# Patient Record
Sex: Male | Born: 1950
Health system: Southern US, Community
[De-identification: ages and names within clinical notes are randomized; demographics above are authoritative.]

## PROBLEM LIST (undated history)

## (undated) DIAGNOSIS — I480 Paroxysmal atrial fibrillation: Secondary | ICD-10-CM

## (undated) DIAGNOSIS — G609 Hereditary and idiopathic neuropathy, unspecified: Secondary | ICD-10-CM

## (undated) DIAGNOSIS — E669 Obesity, unspecified: Secondary | ICD-10-CM

## (undated) DIAGNOSIS — Z9889 Other specified postprocedural states: Secondary | ICD-10-CM

## (undated) DIAGNOSIS — Z9989 Dependence on other enabling machines and devices: Secondary | ICD-10-CM

## (undated) DIAGNOSIS — Z973 Presence of spectacles and contact lenses: Secondary | ICD-10-CM

## (undated) DIAGNOSIS — G4733 Obstructive sleep apnea (adult) (pediatric): Secondary | ICD-10-CM

## (undated) DIAGNOSIS — Z658 Other specified problems related to psychosocial circumstances: Secondary | ICD-10-CM

## (undated) DIAGNOSIS — K219 Gastro-esophageal reflux disease without esophagitis: Secondary | ICD-10-CM

## (undated) DIAGNOSIS — Z8679 Personal history of other diseases of the circulatory system: Secondary | ICD-10-CM

## (undated) DIAGNOSIS — Z8719 Personal history of other diseases of the digestive system: Secondary | ICD-10-CM

## (undated) DIAGNOSIS — Z8614 Personal history of Methicillin resistant Staphylococcus aureus infection: Secondary | ICD-10-CM

## (undated) DIAGNOSIS — Z7901 Long term (current) use of anticoagulants: Secondary | ICD-10-CM

## (undated) DIAGNOSIS — I10 Essential (primary) hypertension: Secondary | ICD-10-CM

## (undated) DIAGNOSIS — N481 Balanitis: Secondary | ICD-10-CM

## (undated) DIAGNOSIS — Z86010 Personal history of colonic polyps: Secondary | ICD-10-CM

## (undated) DIAGNOSIS — Z860101 Personal history of adenomatous and serrated colon polyps: Secondary | ICD-10-CM

## (undated) HISTORY — PX: LAPAROSCOPIC CHOLECYSTECTOMY: SUR755

## (undated) HISTORY — PX: OTHER SURGICAL HISTORY: SHX169

## (undated) HISTORY — DX: Gastro-esophageal reflux disease without esophagitis: K21.9

## (undated) HISTORY — PX: CARDIAC ELECTROPHYSIOLOGY MAPPING AND ABLATION: SHX1292

## (undated) HISTORY — PX: COLONOSCOPY: SHX174

## (undated) HISTORY — DX: Essential (primary) hypertension: I10

## (undated) HISTORY — DX: Other specified problems related to psychosocial circumstances: Z65.8

## (undated) HISTORY — DX: Obesity, unspecified: E66.9

---

## 1999-03-21 ENCOUNTER — Encounter: Payer: Self-pay | Admitting: Family Medicine

## 1999-03-21 ENCOUNTER — Ambulatory Visit (HOSPITAL_COMMUNITY): Admission: RE | Admit: 1999-03-21 | Discharge: 1999-03-21 | Payer: Self-pay | Admitting: Family Medicine

## 2001-09-15 ENCOUNTER — Ambulatory Visit (HOSPITAL_COMMUNITY): Admission: RE | Admit: 2001-09-15 | Discharge: 2001-09-15 | Payer: Self-pay | Admitting: Family Medicine

## 2001-09-15 ENCOUNTER — Encounter: Payer: Self-pay | Admitting: Family Medicine

## 2004-11-09 ENCOUNTER — Ambulatory Visit (HOSPITAL_COMMUNITY): Admission: RE | Admit: 2004-11-09 | Discharge: 2004-11-09 | Payer: Self-pay | Admitting: Gastroenterology

## 2004-11-09 ENCOUNTER — Encounter (INDEPENDENT_AMBULATORY_CARE_PROVIDER_SITE_OTHER): Payer: Self-pay | Admitting: *Deleted

## 2006-09-30 HISTORY — PX: CARDIOVASCULAR STRESS TEST: SHX262

## 2008-04-06 ENCOUNTER — Emergency Department (HOSPITAL_BASED_OUTPATIENT_CLINIC_OR_DEPARTMENT_OTHER): Admission: EM | Admit: 2008-04-06 | Discharge: 2008-04-06 | Payer: Self-pay | Admitting: Emergency Medicine

## 2008-05-18 ENCOUNTER — Inpatient Hospital Stay (HOSPITAL_COMMUNITY): Admission: EM | Admit: 2008-05-18 | Discharge: 2008-05-19 | Payer: Self-pay | Admitting: Emergency Medicine

## 2009-01-08 ENCOUNTER — Inpatient Hospital Stay (HOSPITAL_COMMUNITY): Admission: EM | Admit: 2009-01-08 | Discharge: 2009-01-10 | Payer: Self-pay | Admitting: Cardiology

## 2009-01-08 ENCOUNTER — Ambulatory Visit: Payer: Self-pay | Admitting: Internal Medicine

## 2009-01-08 ENCOUNTER — Encounter: Payer: Self-pay | Admitting: Emergency Medicine

## 2009-01-08 ENCOUNTER — Ambulatory Visit: Payer: Self-pay | Admitting: Diagnostic Radiology

## 2009-01-09 ENCOUNTER — Encounter: Payer: Self-pay | Admitting: Internal Medicine

## 2009-02-07 DIAGNOSIS — I480 Paroxysmal atrial fibrillation: Secondary | ICD-10-CM | POA: Insufficient documentation

## 2009-02-07 DIAGNOSIS — I4892 Unspecified atrial flutter: Secondary | ICD-10-CM

## 2009-02-07 DIAGNOSIS — I10 Essential (primary) hypertension: Secondary | ICD-10-CM | POA: Insufficient documentation

## 2009-02-08 ENCOUNTER — Ambulatory Visit: Payer: Self-pay | Admitting: Internal Medicine

## 2010-01-31 ENCOUNTER — Emergency Department (HOSPITAL_BASED_OUTPATIENT_CLINIC_OR_DEPARTMENT_OTHER): Admission: EM | Admit: 2010-01-31 | Discharge: 2010-01-31 | Payer: Self-pay | Admitting: Emergency Medicine

## 2010-01-31 ENCOUNTER — Ambulatory Visit: Payer: Self-pay | Admitting: Interventional Radiology

## 2010-02-02 ENCOUNTER — Ambulatory Visit (HOSPITAL_COMMUNITY): Admission: RE | Admit: 2010-02-02 | Discharge: 2010-02-02 | Payer: Self-pay | Admitting: Cardiology

## 2010-09-14 LAB — BASIC METABOLIC PANEL
BUN: 16 mg/dL (ref 6–23)
CO2: 26 mEq/L (ref 19–32)
Calcium: 8.8 mg/dL (ref 8.4–10.5)
Chloride: 107 mEq/L (ref 96–112)
Creatinine, Ser: 1.3 mg/dL (ref 0.4–1.5)
GFR calc Af Amer: >60 mL/min (ref >60)
GFR calc non Af Amer: 56 mL/min — ABNORMAL LOW (ref >60)
Glucose, Bld: 121 mg/dL — ABNORMAL HIGH (ref 70–99)
Potassium: 3.9 mEq/L (ref 3.5–5.1)
Sodium: 145 mEq/L (ref 135–145)

## 2010-09-14 LAB — POCT CARDIAC MARKERS: Troponin i, poc: <0.05 ng/mL (ref 0.00–0.09)

## 2010-09-14 LAB — CBC
HCT: 46.3 % (ref 39.0–52.0)
Hemoglobin: 15.7 g/dL (ref 13.0–17.0)
MCV: 97.1 fL (ref 78.0–100.0)
RBC: 4.77 MIL/uL (ref 4.22–5.81)
WBC: 6.4 10*3/uL (ref 4.0–10.5)

## 2010-09-14 LAB — DIFFERENTIAL
Eosinophils Relative: 2 % (ref 0–5)
Lymphocytes Relative: 27 % (ref 12–46)
Lymphs Abs: 1.7 10*3/uL (ref 0.7–4.0)
Monocytes Absolute: 0.5 10*3/uL (ref 0.1–1.0)
Monocytes Relative: 8 % (ref 3–12)

## 2010-10-07 LAB — CBC
HCT: 41.5 % (ref 39.0–52.0)
HCT: 46 % (ref 39.0–52.0)
Platelets: 133 10*3/uL — ABNORMAL LOW (ref 150–400)
Platelets: 146 10*3/uL — ABNORMAL LOW (ref 150–400)
RDW: 13.8 % (ref 11.5–15.5)
WBC: 5.3 10*3/uL (ref 4.0–10.5)
WBC: 6.9 10*3/uL (ref 4.0–10.5)

## 2010-10-07 LAB — DIFFERENTIAL
Basophils Absolute: 0.1 10*3/uL (ref 0.0–0.1)
Basophils Relative: 2 % — ABNORMAL HIGH (ref 0–1)
Eosinophils Relative: 2 % (ref 0–5)
Monocytes Absolute: 0.4 10*3/uL (ref 0.1–1.0)
Neutro Abs: 3.4 10*3/uL (ref 1.7–7.7)

## 2010-10-07 LAB — COMPREHENSIVE METABOLIC PANEL
AST: 28 U/L (ref 0–37)
Albumin: 3.9 g/dL (ref 3.5–5.2)
Alkaline Phosphatase: 65 U/L (ref 39–117)
BUN: 14 mg/dL (ref 6–23)
Chloride: 110 mEq/L (ref 96–112)
Potassium: 4.1 mEq/L (ref 3.5–5.1)
Total Bilirubin: 0.3 mg/dL (ref 0.3–1.2)

## 2010-10-07 LAB — BASIC METABOLIC PANEL
CO2: 28 mEq/L (ref 19–32)
Chloride: 104 mEq/L (ref 96–112)
GFR calc Af Amer: >60 mL/min (ref >60)
GFR calc non Af Amer: >60 mL/min (ref >60)
Sodium: 138 mEq/L (ref 135–145)

## 2010-10-07 LAB — CARDIAC PANEL(CRET KIN+CKTOT+MB+TROPI)
CK, MB: 1.3 ng/mL (ref 0.3–4.0)
Relative Index: 1.3 (ref 0.0–2.5)
Total CK: 100 U/L (ref 7–232)
Troponin I: 0.02 ng/mL (ref 0.00–0.06)

## 2010-10-07 LAB — POCT CARDIAC MARKERS: Troponin i, poc: <0.05 ng/mL (ref 0.00–0.09)

## 2010-11-13 NOTE — Op Note (Signed)
NAME:  Carlos Thomas, Carlos Thomas              ACCOUNT NO.:  [id number]   MEDICAL RECORD NO.:  [id number]           PATIENT TYPE:   LOCATION:                                 FACILITY:   PHYSICIAN:  Hillis Range, MD       DATE OF BIRTH:  06-19-1951   DATE OF PROCEDURE:  DATE OF DISCHARGE:                               OPERATIVE REPORT   PREPROCEDURE DIAGNOSES:  1. Paroxysmal atrial fibrillation.  2. Typical-appearing atrial flutter.   POSTPROCEDURE DIAGNOSES:  1. Counterclockwise isthmus-dependent right atrial flutter.  2. Paroxysmal atrial fibrillation.   PROCEDURES:  1. Comprehensive EP study.  2. Coronary sinus pacing and recording.  3. Mapping of SVT.  4. Ablation of SVT.  5. Arrhythmia induction with pacing.   INTRODUCTION:  Carlos Thomas is a pleasant 60 year old gentleman with a  long-standing history of paroxysmal atrial fibrillation and typical-  appearing atrial flutter.  He reports initially developing atrial  fibrillation in the 1980s.  He was found to have atrial flutter in  November 2009.  He has done well without significant arrhythmias until  early yesterday morning when he developed recurrent symptomatic heart  racing and was found to have recurrent typical-appearing atrial flutter  despite medical therapy with Toprol and flecainide.  He now presents for  EP study and radiofrequency ablation.   DESCRIPTION OF PROCEDURE:  Informed written consent was obtained, and  the patient was brought to the electrophysiology lab in the fasting  state.  He was adequately sedated with intravenous Versed and fentanyl  as outlined in the nursing report.  The patient's right groin was  prepped and draped in the usual sterile fashion by the EP lab staff.  Using a percutaneous Seldinger technique, two 6-French and one 8-French  hemostasis sheaths were placed in the right common femoral vein.  A 6-  French decapolar Polaris X catheter was introduced through the right  common femoral vein and  advanced into the coronary sinus for recording  and pacing from this location.  A 6-French quadripolar Josephson  catheter was introduced through the right common femoral vein and  advanced into the right ventricle for recording and pacing.  This  catheter was then pulled back to the His bundle location.  The patient  presented to the electrophysiology lab in atrial flutter with an atrial  flutter cycle length of 260 msec.  The patient was noted to have  intermittent 2:1 and 4:1 ventricular conduction.  His HV interval  measured 60 msec.  The surface electrogram was consistent with typical  atrial flutter.  The coronary sinus activation sequence was also  consistent with right atrial flutter.  A 7-French [employer] II XP 8-mm ablation catheter was introduced through the right  common femoral vein and advanced into the right atrium.  Entrainment  mapping was performed throughout the right atrium and from the left  atrium via the coronary sinus catheter.  When pacing from the left  atrium, the post pacing interval was much longer than the tachycardia  cycle length.  When pacing from the cavotricuspid isthmus, the post  pacing  interval approximated the tachycardia cycle length.  The  tachycardia was therefore felt to represent isthmus-dependent right  atrial flutter.  I therefore elected to perform cavotricuspid isthmus  ablation.  Electroanatomical mapping of the cavotricuspid isthmus was  performed which revealed a standard-sized isthmus.  A series of  radiofrequency applications were delivered between the tricuspid valve  annulus and the inferior vena cava along the usual cavotricuspid  isthmus.  The tachycardia slowed and then terminated during ablation.  Additional bonus radiofrequency applications were delivered.  Mapping  was then performed along the cavotricuspid isthmus, and atrial  electrograms were successfully ablated.  Following ablation,  differential atrial  pacing was performed from the low lateral right  atrium which confirmed complete bidirectional isthmus block.  The  conduction time when pacing from the proximal coronary sinus to the  ablation catheter positioned on the lateral side of the isthmus line  measured 170 msec.  When pacing from the ablation catheter in this  position, the conduction time to earliest activation recorded from the  coronary sinus catheter also measured 170 msec.  Rapid atrial pacing was  then performed which revealed an AV Wenckebach cycle length of 478 msec  with no evidence of PR greater than RR.  Rapid atrial pacing was  performed down to a cycle length of 240 msec with no tachycardias  induced.  Ventricular pacing was performed which revealed midline  decremental VA conduction with a VA Wenckebach cycle length of 520 msec.  Following ablation, the AH interval measured 112 msec with an HV  interval of 49 msec.  The RR interval measured 175 msec with a PR  interval of 213 msec and a QRS duration of 115 msec.  Atrial  extrastimulus testing was performed which revealed decremental AV  conduction with no AH jumps or echo beats.  The AV nodal ERP was 500/340  msec.  Ventricular extrastimulus testing was performed which revealed  midline decremental VA conduction with no retrograde AH jumps or echo  beats.  The retrograde AV nodal ERP was 600/460 msec.  The patient was  observed for 20 minutes without return of conduction through the  cavotricuspid isthmus.  The procedure was therefore considered  completed.  All catheters were removed, and the sheaths were aspirated  and flushed.  The sheaths were removed and hemostasis was assured.  There were no early apparent complications.   CONCLUSIONS:  1. Counterclockwise isthmus-dependent right atrial flutter upon      presentation.  2. Successful radiofrequency ablation of atrial flutter along the      usual cavotricuspid isthmus with complete bidirectional isthmus       block achieved.  3. No inducible arrhythmias following ablation.  4. No early apparent complications.      Hillis Range, MD  Electronically Signed     JA/MEDQ  D:  01/09/2009  T:  01/10/2009  Job:  [id number]   cc:   Jake Bathe, MD
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
## 2010-11-13 NOTE — Consult Note (Signed)
NAME:  Carlos, Thomas NO.:  [id number]   MEDICAL RECORD NO.:  [id number]          PATIENT TYPE:  INP   LOCATION:  3712                         FACILITY:  [facility]   PHYSICIAN:  Hillis Range, MD       DATE OF BIRTH:  May 31, 1951   DATE OF CONSULTATION:  DATE OF DISCHARGE:                                 CONSULTATION   PRIMARY CAREGIVER:  Stacie Acres. Cliffton Asters, MD   CARDIOLOGIST:  Francisca December, MD   This patient has no known drug allergies.   PRESENTING CIRCUMSTANCE:  I came in with my old heart rhythm problem  again.   HISTORY OF PRESENT ILLNESS:  Mr. 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  He has a fairly long history of  paroxysmal atrial fibrillation/atrial flutter.   His first diagnosis was in 1984.  At that time, he was having very  transient, fleeting experiences of a lurching, fluttering feeling in  his chest.  He was treated from 1984 until of October 2009 with Lanoxin  0.25 mg daily and if he had any of these experiences they were very  short lived.   The patient came to the emergency room in October 2009 with symptoms of  chest discomfort, a fluttery feeling, but it would not laid up.  He had  a spontaneous conversion to sinus rhythm in the emergency room and was  discharged.   The patient returned to the emergency room at [facility] in  November 2009.  He had 4 hours of symptomatic atrial arrhythmias.  They  were diagnosed at that time as atrial flutter.  He was seen by Dr.  Ty Hilts of [facility] and started on flecainide 100 mg twice daily  at that time.  Once again, he had a spontaneous pharmacologic conversion  to sinus rhythm.  The patient has been symptom free on flecainide from  November 2009 until 9:30 in the morning of January 08, 2009.  At that time,  he experienced a return of his atrial arrhythmias.  He waited from 9  o'clock in the morning to about 9 in the evening, but then went to the  emergency room and his symptoms did not abate.  Once again, he was  diagnosed with atrial flutter.  It looks like a typical pattern.  Rate  is about 127-135 beats per minute.  The patient described these as a  lurching, fluttery feeling in his chest.  He does not have palpitations.  He is not lightheaded or dizzy.  He does have decreased exercise  tolerance.  Does not note any increased difficulty breathing.   The patient has breakthrough of atrial arrhythmia, on the flecainide  antiarrhythmic therapy.  It is thought by his primary cardiologist, Dr.  Ty Hilts that the patient would benefit from electrophysiology study and  radiofrequency catheter ablation.  He was seen by Dr. Johney Frame today who  examined electrocardiogram, talked with the patient in great length  outlining the benefits and risks of the procedure.  The patient wishes  to proceed with electrophysiology study and radiofrequency catheter  ablation of atypical atrial flutter.   ALLERGIES:  This patient no known drug allergies.   MEDICATIONS:  1. Flecainide 100 mg p.o. b.i.d.  2. Omeprazole 20 mg daily.  3. Enteric-coated aspirin 325 mg daily.  4. Toprol-XL 25 mg daily.   SOCIAL HISTORY:  The patient is married.  He does not smoke.  He very  occasionally drinks alcohol.  He works as an Lexicographer at [employer]   FAMILY HISTORY:  Negative for atrial arrhythmias.   Past cardiac workup for this patient echocardiogram in November 2009,  ejection fraction normal, trace of mitral regurgitation.  He had a  negative Myoview study in April 2008.  Of note, the patient drinks 1  caffeinated cup of coffee a day.   PAST MEDICAL HISTORY:  1. History of atrial fib/flutter, diagnosed in 1984, paroxysmal.  2. Hiatal hernia repair in 1970s.  3. PTSD.  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   PHYSICAL EXAMINATION:  GENERAL:  The patient is alert and oriented x3,   comfortable, in no acute distress.  VITAL SIGNS:  Temperature 97.7, pulse is 63 and regular, respirations  18, blood pressure 99/64, and oxygen saturation 95% on room air.  LUNGS:  Clear to auscultation bilaterally.  HEART:  Regular rate and rhythm.  ABDOMEN:  Soft, nondistended, and nontender.  Bowel sounds are present.  EXTREMITIES:  No evidence of clubbing, cyanosis, or edema.  NEUROLOGIC:  Grossly intact.   LABORATORY STUDIES THIS ADMISSION:  Cardiac panel:  Troponins are less  than 0.05, 0.02, and 0.01.  Comprehensive metabolic panel:  Sodium 145,  potassium 4.1, chloride 110, carbonate 25, glucose 126, BUN is 14,  creatinine is 0.9, alkaline phosphatase 65, SGOT is 28, and SGPT is 31.  White cells 5.3, hemoglobin 15.3, hematocrit 46.0, and platelets of 146.   Our plan is for electrophysiology study and radiofrequency catheter  ablation of atypical atrial flutter, Dr. Johney Frame.      [name], [place]      Hillis Range, MD     GM/MEDQ  D:  01/09/2009  T:  01/10/2009  Job:  [id number]

## 2010-11-13 NOTE — Discharge Summary (Signed)
Carlos Thomas, Carlos Thomas              ACCOUNT NO.:  [id number]   MEDICAL RECORD NO.:  [id number]          PATIENT TYPE:  INP   LOCATION:  3731                         FACILITY:  [facility]   PHYSICIAN:  Jake Bathe, MD      DATE OF BIRTH:  October 11, 1950   DATE OF ADMISSION:  05/18/2008  DATE OF DISCHARGE:  05/19/2008                               DISCHARGE SUMMARY   DISCHARGE DIAGNOSES:  1. Atrial flutter, spontaneous conversion back to normal sinus rhythm.  2. Flecainide therapy  3. Chronic psychosocial stress.  4. Nissen fundoplication, 1982.  5. Cholecystectomy in 2005.  6. Normal ejection fraction by recent echo.  7. Elevated blood pressure.   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  He has had a history of lone atrial fibrillation (4  episodes over the past few weeks lasting minutes to hours in duration)  and has been treated by Dr. [name] with Lanoxin.   On the day of admission, around 8:30 a.m., he states I do not feel  right.  His last episode of palpitations and dizziness was in October  2009 that lasted about 4 hours and then spontaneously converted back to  normal sinus rhythm.  On presentation to the emergency room, he was in  atrial flutter with 2:1 block with ventricular response to 160.  With  Valsalva maneuver, he had AV nodal variable conduction.   While in the hospital, we started him on flecainide.  Before his first  dose of flecainide, he did convert to normal sinus rhythm but we  continued with the therapy anyway to hopefully suppress further  symptomatic flutter.  We switched him over to metoprolol 25 ER mg QD  with the use of flecainide. We discontinued the digoxin, heart rate was  50 bpm on ECG in am.  Dr. Anne Fu did obtain a curbside consult from the  EP who agreed this would be a great way to manage the patient.   The following day, the  patient's QTC was 388.  He remained in sinus  rhythm and we felt that it was safe for him to go home.   LABORATORY DATA:  Lab studies during the patient's hospitalization  include normal cardiac isoenzymes.  TSH 0.812.  Digoxin level 1.2.  Sodium 140, potassium 4.0, BUN 10, creatinine 0.79, and glucose 97.  Hemoglobin 15.0, hematocrit 44.5, white count 5.5, and platelets 154.   Chest x-ray showed no acute cardiopulmonary findings.   DISCHARGE MEDICATIONS:  1. Flecainide 100 mg p.o. b.i.d.  2. Aspirin 325 mg 1 tablet daily.  3. Metoprolol ER 25 mg once daily.  4. Omeprazole 20 mg once daily.   DISCHARGE INSTRUCTIONS:  The patient is to remain on a low-sodium heart-  healthy diet.  Increase activity slowly.  Follow up with Tillman Sers  nurse practitioner for Dr. Corliss Marcus on May 23, 2008 at 11:30  a.m.  He is scheduled for a treadmill test the following Monday,  May 30, 2008 at 8:30  a.m.  He is not to eat after midnight the  night before the test.  He is okay to take his medications.  The reason  for the treadmill is to see if we can recreate any arrhythmias with  exercise.  He is instructed to stop his digoxin. He is to obtain an ECG  at follow up visit.   DISCHARGE TIME:  Greater than 35 minutes.      Guy Franco, P.A.      Jake Bathe, MD  Electronically Signed    LB/MEDQ  D:  05/19/2008  T:  05/19/2008  Job:  [id number]

## 2010-11-13 NOTE — H&P (Signed)
NAME:  Carlos, Thomas NO.:  [id number]   MEDICAL RECORD NO.:  [id number]          PATIENT TYPE:  INP   LOCATION:  3731                         FACILITY:  [facility]   PHYSICIAN:  Carlos Bathe, MD      DATE OF BIRTH:  1950/10/20   DATE OF ADMISSION:  05/18/2008  DATE OF DISCHARGE:                              HISTORY & PHYSICAL   CARDIOLOGIST:  Carlos December, MD   PRIMARY CARE PHYSICIAN:  Carlos Acres. White, MD   CHIEF COMPLAINT:  Atrial flutter.   HISTORY OF PRESENT ILLNESS:  A 60 year old male patient of Carlos Thomas  who was recently evaluated for lone atrial fibrillation with negative  nuclear stress test in April 2008, recent echocardiogram done last week  showing left atrial size of 4.7 cm, normal ejection fraction, trace  mitral regurgitation with palpitation starting at 8:30 this morning.  He  felt very similar with his prior episodes of lone atrial fibrillation,  the longest of which was 4 hours in October.  When the palpitations did  not subside, he decided to call the office and EMS.  He was then brought  to [facility] Emergency Department for evaluation.  An EMS strip ECG  showed atrial flutter with 2:1 conduction.  Here in the emergency  department, this atrial flutter was confirmed.  We attempted Valsalva  maneuvers which did cause variable AV block and flutter waves were  pronounced.  He did, however, returned once again to 2:1 atrial flutter  at a rate of around 160 beats per minute.  In regards to other symptoms,  he is not having any chest pain, no shortness of breath, no syncope, no  fevers, chills, cough, vomiting, or bleeding.  He does note that he has  some insomnia, but this is not new.  No bowel or bladder changes.  He  does not recall the last time he had his thyroid evaluated.  His wife is  here at bedside.   PAST MEDICAL HISTORY:  1. Atrial fibrillation - lone AFib.  Echocardiogram as described      above.  2. Prior chest pain -  negative nuclear stress test last year.  3. Chronic psychosocial stress from his post [place] days and work as      a Company secretary.   PAST SURGICAL HISTORY:  Hiatal hernia repair in the 1970s, full  laparotomy noted.   ALLERGIES:  No known drug allergies.   MEDICATIONS:  Carlos Thomas had placed him on,  1. Digoxin 0.5 mg once a day.  A recent digoxin level was shown to be      in a therapeutic range.  2. Aspirin 325 mg a day or aspirin 81 mg a day.  3. Omeprazole 20 mg a day.   SOCIAL HISTORY:  No tobacco ever.  Few drinks of alcohol, he has had 3  this week.  No binge drinking.  No drug use.  Has one serving of coffee  a day.  He exercises 3 times weekly, vigorous running 1 mile, sit-ups,  push-ups, and works out with his students.  He has occupation  as an  Secondary school teacher for [employer].   REVIEW OF SYSTEMS:  Unless specified above all other 12 review of  systems negative.   PHYSICAL EXAMINATION:  VITAL SIGNS:  Temperature afebrile, pulse 160,  blood pressure 113/70, respirations 16, and saturating 100% on room air.  GENERAL:  Alert and oriented x3 in no acute distress, here with his  wife.  HEENT:  Normocephalic and atraumatic.  No scleral icterus.  EOMI.  NECK:  Supple.  No carotid bruits.  No JVD.  Full range of motion.  No  thyromegaly appreciated.  CARDIOVASCULAR:  Tachycardic, regular rhythm.  No appreciable murmurs.  Normal PMI, bounding.  LUNGS:  Clear to auscultation bilaterally.  Normal respiratory effort.  ABDOMEN:  Soft and nontender.  Normoactive bowel sounds.  No rebound, no  guarding, no hepatosplenomegaly.  Prior hiatal hernia surgical scar  noted.  MUSCULOSKELETAL:  Full range of motion noted.  NEUROLOGIC:  Nonfocal.  No tremors.  Normal-appearing reflexes.  PSYCH:  Normal mood.  EXTREMITIES:  No clubbing, cyanosis, or edema.   LABORATORY DATA:  Chest x-ray currently pending.  Echocardiogram from  the office showed left atrial size of 4.7 cm, normal  ejection fraction,  and normal LV size.  Complete metabolic profile currently pending.  INR  pending.  TSH pending.  EKG personally viewed shows atrial flutter 2:1  conduction, rate of approximately 160.  As noted in HPI with Valsalva,  he was able to transmit variable AV conduction.   ASSESSMENT AND PLAN:  A 60 year old male with prior history of lone  atrial fibrillation now with atrial flutter with rapid ventricular  response.  1. Atrial flutter - with normal nuclear stress test earlier last year      as well as normal left ventricular ejection fraction on      echocardiogram,  I would like to try an antiarrhythmic on him,      flecainide 50 mg twice a day with an increase to 100 mg BID on      discharge to try to put him back into sinus rhythm.  We will obtain      an EKG and continue monitoring his QT interval after use of      flecainide.  We have discussed risk of stroke with atrial      flutter/fibrillation and given his low [place] score, currently zero      (he does not have hypertension, he does not have diabetes, he has      not had a prior stroke, his age is less than 55, and he does not      have any evidence of heart failure), I do feel fine treating him      with aspirin 325 mg once a day.  I will also add low-dose beta-      blocker metoprolol 25 mg twice a day to his regimen and continue      his digoxin at 125 mcg once a day.  If flecainide is unsuccessful      at cardioverting him, we may try electrical cardioversion.  I have      also discussed with him the possibility of atrial flutter ablation      in the future if necessary.  At this point, we will check TSH,      complete metabolic profile, and CBC.  I am also awaiting chest x-      ray.  We will admit to telemetry floor.      Carlos Leriche  Wilber Oliphant, MD  Electronically Signed     MCS/MEDQ  D:  05/18/2008  T:  05/19/2008  Job:  [id number]   cc:   Carlos Thomas, M.D.

## 2010-11-13 NOTE — H&P (Signed)
NAMEEDOUARD, GIKAS NO.:  [id number]   MEDICAL RECORD NO.:  [id number]          PATIENT TYPE:  INP   LOCATION:  3735                         FACILITY:  [facility]   PHYSICIAN:  Therisa Doyne, MD    DATE OF BIRTH:  [birth year]   DATE OF ADMISSION:  01/08/2009  DATE OF DISCHARGE:                              HISTORY & PHYSICAL   CHIEF COMPLAINT:  Atrial flutter with rapid ventricular response.   HISTORY OF PRESENT ILLNESS:  A 60 year old white male with past medical  history significant for paroxysmal atrial fibrillation and atrial  flutter who presents atrial flutter with rapid response.  The patient  states he was in his usual state of health, until this morning at 9:30  a.m. on July 1; he began having palpitations and noticed that he was in  atrial fibrillation with rapid ventricular response.  He denies any  other symptoms.  He denies any chest pain, shortness of breath,  orthopnea, PND, syncope or lower extremity edema.  When asked about  precipitants, he states that he drinks one cup of coffee per day and he  had had his coffee this morning when his symptoms started.  He also  reports staying up late the previous night as well as being mildly  dehydrated.  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  Otherwise, he denies any precipitants.   The patient was seen and evaluated at an outside emergency department.  He was given 5 mg IV Lopressor and his systolic blood pressure dropped  to 90.  It was recommended to be transferred to [facility], as  that is where his primary cardiologist was.   PAST MEDICAL HISTORY:  1. Paroxysmal atrial fibrillation and flutter.  2. CHADS2 score equals one for hypertension.  He has no history of      diabetes, strokes or TIAs.  He is maintained on aspirin therapy for      his paroxysmal atrial fibrillation, atrial flutter.   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  He drinks one cup of caffeinated  coffee per day.  He denies any tobacco use.  He drinks less than one  alcoholic drink per week.   FAMILY HISTORY:  Negative for atrial fibrillation.   MEDICATIONS:  1. Aspirin 325 mg daily.  2. Toprol XL 25 mg daily.  3. Flecainide 100 mg b.i.d.  4. Omeprazole 20 mg daily.   ALLERGIES:  NO KNOWN DRUG ALLERGIES.   REVIEW OF SYSTEMS:  Notable for intermittent right facial numbness and  tingling last week which was transient.  There was no other associated  neurological signs.  Otherwise, all systems reviewed and negative except  as mentioned above in history of present illness.   PHYSICAL EXAMINATION:  VITAL SIGNS:  Temperature afebrile, blood  pressure of 95/71, pulse 127, respirations 18, oxygen saturation 94% on  room air.  GENERAL:  No acute distress.  HEENT: Normocephalic atraumatic.  Oropharynx pink and moist without  lesions.  NECK:  Supple.  No lymphadenopathy or  jugular distention.  No masses.  CARDIOVASCULAR:  Tachycardic, irregular.  No murmurs, rubs or gallops.  CHEST:  Clear to auscultation bilaterally.  ABDOMEN:  Positive bowel sounds, soft, nontender, nondistended.  EXTREMITIES:  No clubbing, sinus or edema.  Dorsalis pedis pulse 2+  bilaterally.  SKIN:  No rashes.  BACK:  No CVA tenderness.   PERTINENT DATA:  1. Normal CBC, potassium 4.1, BUN 14, creatinine 0.9.  Normal liver      function studies.  Troponin less than 0.05, INR one.  Chest x-ray:      No acute cardiopulmonary disease.  2. EKG shows atrial flutter with variable AV block with ventricular      rate of 127 beats per minute.  There is nonspecific ST-T wave      changes.  The EKG pronounce shows a calculated QTC of 497.      However, I think this is measuring the flutter waves.  On my      calculation, the QT measures 280 and QTC is 407.   ASSESSMENT/PLAN:  Mr. Xia is a 60 year old white male with past  medical history  significant for paroxysmal atrial fibrillation and  borderline hypertension who presents with atrial flutter with rapid  ventricular response.   1. Will admit the patient to the [facility] Cardiology service under Dr.      [name] care.   1. Atrial flutter with rapid ventricle response.  It is unclear what      the precipitant for this was.  However, the patient does report      that he has been under a little bit of stress recently as well as      may have been mildly dehydrated.  Certainly all these things could      be playing a role in this.  He has not had an evening dose of      flecainide, so we will actually go ahead and give him his evening      dose with as well as supplemental dose for total of 200 mg.  I will      do this in hopes of converting him to normal sinus rhythm.      Continue him on flecainide 100 mg b.i.d. starting in the morning.      Continue Toprol XL 25 mg daily.  If he does not convert with the      200 mg of flecainide, then I think he should be cardioverted in the      morning to normal sinus rhythm.  There will be no need to perform a      transesophageal echocardiogram as these symptoms started less than      48 hours ago.  He states that they started at 9:30 a.m. on July 11.      As he will likely undergoing cardioversion, we will go ahead and      systemically anticoagulate the patient with Lovenox 1 mg/kg q.12 h.      Check TSH as well as cycle cardiac enzymes.  Continue aspirin      therapy at 325 mg daily.  The patient may be mildly volume depleted      from dehydration.  We will start him on normal saline at 75 mL per      hour.   1. Gastric she reflux disease.  Continue omeprazole.   1. Fluids, Electrolytes, Nutrition.  Electrolytes are stable.  Check a      magnesium level.  Normal saline  at 75 cc/hour n.p.o.   1. Deep vein thrombosis prophylaxis.  Not indicated as the patient      will be on systemic anticoagulation with Lovenox.     Therisa Doyne, MD  Electronically Signed    SJT/MEDQ  D:  01/09/2009  T:  01/09/2009  Job:  [id number]

## 2010-11-16 NOTE — Op Note (Signed)
NAME:  Carlos Thomas, Carlos Thomas              ACCOUNT NO.:  [id number]   MEDICAL RECORD NO.:  [id number]          PATIENT TYPE:  AMB   LOCATION:  ENDO                         FACILITY:  [facility]   PHYSICIAN:  Petra Kuba, M.D.    DATE OF BIRTH:  24-Feb-1951   DATE OF PROCEDURE:  11/09/2004  DATE OF DISCHARGE:                                 OPERATIVE REPORT   PROCEDURE:  Colonoscopy.   INDICATIONS FOR PROCEDURE:  Screening.  Consent was signed after risks,  benefits, methods, and options were thoroughly discussed in the office by my  nurse.   MEDICATIONS USED:  Demerol 100, Versed 10.   PROCEDURE:  Rectal inspection was pertinent for small external hemorrhoids.  Digital exam was negative.  The video colonoscope was inserted and with  abdominal pressure, easily advanced around the colon to the cecum.  Other  than an occasional left sided diverticula, no abnormalities were seen on  insertion.  The cecum was identified by the appendiceal orifice and the  ileocecal valve.  The scope was slowly withdrawn.  The prep was adequate,  there was some liquid stool that required washing and suctioning.  In the  ascending colon proximal transverse, two small polyps were seen, snared,  electrocautery applied, polyps were suctioned through the scope and  collected in the trap.  There were an additional five other tiny polyps in  this area, descending and proximal transverse, which were hot biopsied and  all put in the same container.  The scope was slowly withdrawn.  In the more  distal transverse, a small polyp was seen and electrocautery applied.  The  polyp was suctioned through the scope and collected in the trap.  There was  also another small distal transverse polyp biopsied as well as two proximal  descending tiny polyps which were hot biopsied and they were all put in the  second container.  The scope was further withdrawn.  Other than the  occasional left sided diverticula, no other abnormalities  were seen as we  slowly withdrew back to the rectum.  Once back in the rectum, anorectal pull  through and retroflexion confirmed some small hemorrhoids.  The scope was  straightened and readvanced a short ways to the left side of the colon, air  was suctioned, the scope was removed.  The patient tolerated the procedure  well.  There was no obvious complications.   ENDOSCOPIC DIAGNOSIS:  1.  Internal and external hemorrhoids.  2.  Occasional left sided diverticulum.  3.  Two tiny descending polyps, hot biopsied.  4.  One tiny and one small distal transverse polyp, hot biopsied and snared.  5.  Multiple ascending and proximal transverse polyps, two snared, five hot      biopsied.  6.  Otherwise, within normal limits to the cecum.   PLAN:  Await pathology to determine future colonic screening, probably  recheck in three years.  Hold aspirin products for seven days based on  number of polyps and cautery used.  Happy to see back p.r.n.  Otherwise  return to Dr. Tiburcio Pea for the customary health care maintenance.  MEM/MEDQ  D:  11/09/2004  T:  11/09/2004  Job:  [id number]   cc:   Holley Bouche, M.D.  [address]  [place], [place] [address]  Fax: [phone]

## 2010-11-22 ENCOUNTER — Other Ambulatory Visit: Payer: Self-pay | Admitting: Chiropractor

## 2010-11-22 DIAGNOSIS — Z139 Encounter for screening, unspecified: Secondary | ICD-10-CM

## 2010-11-22 DIAGNOSIS — M79601 Pain in right arm: Secondary | ICD-10-CM

## 2010-11-23 ENCOUNTER — Inpatient Hospital Stay: Admission: RE | Admit: 2010-11-23 | Payer: Self-pay | Source: Ambulatory Visit

## 2010-11-23 ENCOUNTER — Other Ambulatory Visit: Payer: Self-pay

## 2011-01-01 ENCOUNTER — Encounter: Payer: Self-pay | Admitting: Internal Medicine

## 2011-03-15 ENCOUNTER — Encounter (HOSPITAL_BASED_OUTPATIENT_CLINIC_OR_DEPARTMENT_OTHER): Payer: Self-pay | Admitting: Emergency Medicine

## 2011-03-15 ENCOUNTER — Other Ambulatory Visit: Payer: Self-pay

## 2011-03-15 ENCOUNTER — Emergency Department (HOSPITAL_BASED_OUTPATIENT_CLINIC_OR_DEPARTMENT_OTHER)
Admission: EM | Admit: 2011-03-15 | Discharge: 2011-03-15 | Disposition: A | Discharge status: Home / Self Care | Payer: BC Managed Care – PPO | Attending: Emergency Medicine | Admitting: Emergency Medicine

## 2011-03-15 DIAGNOSIS — R002 Palpitations: Secondary | ICD-10-CM | POA: Insufficient documentation

## 2011-03-15 DIAGNOSIS — I4891 Unspecified atrial fibrillation: Secondary | ICD-10-CM | POA: Insufficient documentation

## 2011-03-15 DIAGNOSIS — I4892 Unspecified atrial flutter: Secondary | ICD-10-CM | POA: Insufficient documentation

## 2011-03-15 DIAGNOSIS — I10 Essential (primary) hypertension: Secondary | ICD-10-CM | POA: Insufficient documentation

## 2011-03-15 LAB — CBC
Hemoglobin: 16.1 g/dL (ref 13.0–17.0)
MCHC: 35.2 g/dL (ref 30.0–36.0)
RDW: 13.5 % (ref 11.5–15.5)
WBC: 4.8 10*3/uL (ref 4.0–10.5)

## 2011-03-15 LAB — DIFFERENTIAL
Basophils Absolute: 0 10*3/uL (ref 0.0–0.1)
Basophils Relative: 0 % (ref 0–1)
Lymphocytes Relative: 28 % (ref 12–46)
Monocytes Relative: 13 % — ABNORMAL HIGH (ref 3–12)
Neutro Abs: 2.6 10*3/uL (ref 1.7–7.7)
Neutrophils Relative %: 54 % (ref 43–77)

## 2011-03-15 LAB — BASIC METABOLIC PANEL
CO2: 25 mEq/L (ref 19–32)
Chloride: 105 mEq/L (ref 96–112)
GFR calc Af Amer: >60 mL/min (ref >60)
Potassium: 3.9 mEq/L (ref 3.5–5.1)

## 2011-03-15 LAB — TROPONIN I: Troponin I: <0.3 ng/mL (ref <0.30)

## 2011-03-15 MED ORDER — SODIUM CHLORIDE 0.9 % IV SOLN: Freq: Once | INTRAVENOUS | Status: DC

## 2011-03-15 MED ORDER — METOPROLOL TARTRATE 1 MG/ML IV SOLN
INTRAVENOUS | Status: AC
  Administered 2011-03-15: 5 mg
  Filled 2011-03-15: qty 15

## 2011-03-15 MED ORDER — METOPROLOL TARTRATE 1 MG/ML IV SOLN
5.0000 mg | INTRAVENOUS | Status: DC | PRN
  Administered 2011-03-15: 5 mg via INTRAVENOUS

## 2011-03-15 NOTE — ED Notes (Signed)
Cardiac rhythm remains irregular (afib vs. Aflutter), but rate is now 80s-90s

## 2011-03-15 NOTE — ED Provider Notes (Signed)
Assumed care from Dr. Read Drivers at 7:00.  I will contact the low cardiology after 8:00 for request that he be evaluated in the office for possible cardioversion since his symptoms started last night.  He may be a candidate for cardioversion.  8:52 AM I spoke with Dr. Anne Fu.  He asked me to call his office to arrange for patient to be seen today for possible cardioversion.  I explained this to pt. He understands and agrees. He has heart rate in 70-80s.Marland Kitchen  8:56 AM I spoke with office staff. They will work him in . They want him to come now with copies of ecgs.   Nicholes Stairs, MD 03/15/11 [phone]

## 2011-03-15 NOTE — ED Notes (Signed)
Pt and spouse verbalized understanding to go directly to [facility] Cardiology. Copies of both EKG's given to pt.

## 2011-03-15 NOTE — ED Notes (Signed)
Pt reports chest "fluttering" since last pm, intermittent; hx of afib & aflutter; denies other sx presently.

## 2011-03-15 NOTE — ED Provider Notes (Addendum)
History     CSN: [id number] Arrival date & time: 03/15/2011  5:47 AM   Chief Complaint  Patient presents with  . Palpitations     (Include location/radiation/quality/duration/timing/severity/associated sxs/prior treatment) HPI This is a 60 year old white male with history of paroxysmal atrial flutter and atrial fibrillation. He has had an ablation for this in the past. He felt his heart go into atrial flutter yesterday evening about 8 PM. He describes it as feeling like a flutter. The rate is faster than normal. It is not exacerbated or mitigated by anything, though he did drink a glass of wine before it occurred. There is no accompanying chest pain, shortness of breath, nausea, vomiting, lightheadedness, weakness or diaphoresis. He took 240 mg of Cardizem about 45 minutes prior to arrival.  Past Medical History  Diagnosis Date  . HTN (hypertension)   . Atrial flutter   . Atrial fibrillation      Past Surgical History  Procedure Date  . Cti ablation 7/10    for atrial flutter  . Hernia repair     Family History  Problem Relation Age of Onset  . Atrial fibrillation Neg Hx     History  Substance Use Topics  . Smoking status: Unknown If Ever Smoked  . Smokeless tobacco: Not on file   Comment: denies any tobacco use   . Alcohol Use: Yes     lass than 1 alcoholic beverage/week       Review of Systems  All other systems reviewed and are negative.    Allergies  Review of patient's allergies indicates no known allergies.  Home Medications   Current Outpatient Rx  Name Route Sig Dispense Refill  . DILTIAZEM HCL COATED BEADS 240 MG PO CP24 Oral Take 240 mg by mouth daily.      . ASPIRIN 325 MG PO TBEC Oral Take 325 mg by mouth daily.      Marland Kitchen FLECAINIDE ACETATE 100 MG PO TABS Oral Take 100 mg by mouth 2 (two) times daily.      Marland Kitchen METOPROLOL SUCCINATE 25 MG PO TB24 Oral Take 25 mg by mouth daily.      Marland Kitchen OMEPRAZOLE 40 MG PO CPDR Oral Take 40 mg by mouth daily.         Physical Exam    BP 131/89  Pulse 115  Resp 18  Ht 5\' 11"  (1.803 m)  Wt 230 lb (104.327 kg)  BMI 32.08 kg/m2  SpO2 97%  Physical Exam General: Well-developed, well-nourished male in no acute distress; appearance consistent with age of record HENT: normocephalic, atraumatic Eyes: Normal Neck: supple Heart: Tachycardic; regular rhythm; atrial flutter seen on monitor Lungs: clear to auscultation bilaterally Abdomen: soft; nontender; nondistended; no masses or hepatosplenomegaly; bowel sounds present Extremities: No deformity; full range of motion; pulses normal Neurologic: Awake, alert and oriented;motor function intact in all extremities and symmetric; no facial droop Skin: Warm and dry Psychiatric: Normal mood and affect   ED Course  Procedures    MDM  EKG Interpretation:  Date & Time: 03/15/2011 5:39 AM  Rate: 126  Rhythm: atrial flutter  QRS Axis: indeterminate  Intervals: QT prolonged  ST/T Wave abnormalities: nonspecific T wave changes  Conduction Disutrbances:none  Narrative Interpretation:   Old EKG Reviewed: changes noted: Previously atrial bigeminy (02/02/2010)  Nursing notes and vitals signs, including pulse oximetry, reviewed.  Summary of this visit's results, reviewed by myself:  Labs:  Results for orders placed during the hospital encounter of 03/15/11  CBC  Component Value Range   WBC 4.8  4.0 - 10.5 (K/uL)   RBC 4.91  4.22 - 5.81 (MIL/uL)   Hemoglobin 16.1  13.0 - 17.0 (g/dL)   HCT 16.1  09.6 - 04.5 (%)   MCV 93.1  78.0 - 100.0 (fL)   MCH 32.8  26.0 - 34.0 (pg)   MCHC 35.2  30.0 - 36.0 (g/dL)   RDW 40.9  81.1 - 91.4 (%)   Platelets 151  150 - 400 (K/uL)  DIFFERENTIAL      Component Value Range   Neutrophils Relative 54  43 - 77 (%)   Neutro Abs 2.6  1.7 - 7.7 (K/uL)   Lymphocytes Relative 28  12 - 46 (%)   Lymphs Abs 1.3  0.7 - 4.0 (K/uL)   Monocytes Relative 13 (*) 3 - 12 (%)   Monocytes Absolute 0.6  0.1 - 1.0 (K/uL)    Eosinophils Relative 4  0 - 5 (%)   Eosinophils Absolute 0.2  0.0 - 0.7 (K/uL)   Basophils Relative 0  0 - 1 (%)   Basophils Absolute 0.0  0.0 - 0.1 (K/uL)  BASIC METABOLIC PANEL      Component Value Range   Sodium 141  135 - 145 (mEq/L)   Potassium 3.9  3.5 - 5.1 (mEq/L)   Chloride 105  96 - 112 (mEq/L)   CO2 25  19 - 32 (mEq/L)   Glucose, Bld 134 (*) 70 - 99 (mg/dL)   BUN 9  6 - 23 (mg/dL)   Creatinine, Ser 7.82  0.50 - 1.35 (mg/dL)   Calcium 9.0  8.4 - 95.6 (mg/dL)   GFR calc non Af Amer >60  >60 (mL/min)   GFR calc Af Amer >60  >60 (mL/min)  TROPONIN I      Component Value Range   Troponin I <0.30  <0.30 (ng/mL)    Imaging Studies: No results found.  6:32 AM Patient now in atrial fibrillation with controlled rate after receiving a total of 10 mg of metoprolol IV.  7:04 AM The plan is to have the patient monitored in the ED and to legal office opens. At that time we'll contact To arrange for the patient to be seen promptly in the office. This has been explained Dr. Weldon Inches.         Hanley Seamen, MD 03/15/11 2130  Hanley Seamen, MD 03/27/11 [phone]

## 2011-04-02 LAB — BASIC METABOLIC PANEL
BUN: 11
CO2: 27
Calcium: 9.5
Creatinine, Ser: 1.1
Glucose, Bld: 106 — ABNORMAL HIGH
Sodium: 144

## 2011-04-02 LAB — POCT CARDIAC MARKERS: CKMB, poc: 2.1

## 2011-04-03 LAB — CBC
HCT: 44.5
Hemoglobin: 15
MCHC: 33.7
RBC: 4.56

## 2011-04-03 LAB — COMPREHENSIVE METABOLIC PANEL
BUN: 10
CO2: 24
Calcium: 8.6
Creatinine, Ser: 0.79
GFR calc non Af Amer: >60
Glucose, Bld: 97
Sodium: 140
Total Protein: 5.7 — ABNORMAL LOW

## 2011-04-03 LAB — DIFFERENTIAL
Eosinophils Absolute: 0
Lymphocytes Relative: 23
Lymphs Abs: 1.3
Monocytes Relative: 7
Neutro Abs: 3.8
Neutrophils Relative %: 68

## 2011-04-03 LAB — APTT: aPTT: 29

## 2011-04-03 LAB — DIGOXIN LEVEL: Digoxin Level: 1.2

## 2011-04-03 LAB — TSH: TSH: 0.812

## 2011-04-03 LAB — CK TOTAL AND CKMB (NOT AT ARMC)
CK, MB: 1.5
CK, MB: 1.8
Total CK: 115

## 2011-04-03 LAB — PROTIME-INR
INR: 1
Prothrombin Time: 13.6

## 2011-04-03 LAB — TROPONIN I: Troponin I: 0.01

## 2011-07-24 ENCOUNTER — Encounter: Payer: Self-pay | Admitting: Internal Medicine

## 2011-07-29 ENCOUNTER — Encounter: Payer: Self-pay | Admitting: Internal Medicine

## 2011-07-29 ENCOUNTER — Telehealth: Payer: Self-pay | Admitting: *Deleted

## 2011-07-29 ENCOUNTER — Ambulatory Visit (INDEPENDENT_AMBULATORY_CARE_PROVIDER_SITE_OTHER): Payer: BC Managed Care – PPO | Admitting: Internal Medicine

## 2011-07-29 ENCOUNTER — Encounter: Payer: Self-pay | Admitting: *Deleted

## 2011-07-29 VITALS — BP 131/75 | HR 58 | Ht 71.0 in | Wt 232.0 lb

## 2011-07-29 DIAGNOSIS — I4891 Unspecified atrial fibrillation: Secondary | ICD-10-CM

## 2011-07-29 DIAGNOSIS — I10 Essential (primary) hypertension: Secondary | ICD-10-CM

## 2011-07-29 LAB — BASIC METABOLIC PANEL
BUN: 13 mg/dL (ref 6–23)
CO2: 25 mEq/L (ref 19–32)
Chloride: 107 mEq/L (ref 96–112)
Potassium: 4.1 mEq/L (ref 3.5–5.1)

## 2011-07-29 MED ORDER — RIVAROXABAN 20 MG PO TABS: 20.0000 mg | ORAL_TABLET | Freq: Every day | ORAL | Status: DC

## 2011-07-29 NOTE — Patient Instructions (Signed)
Your physician has recommended you make the following change in your medication: Stop Aspirin, Start Xarelto 20mg  1 tablet daily.  See instruction sheet for ablation.

## 2011-07-29 NOTE — Assessment & Plan Note (Signed)
The patient has symptomatic persistent atrial fibrillation.  His afib burden is increasing despite medical therapy with flecainide. Therapeutic strategies for afib including medicine and ablation were discussed in detail with the patient today. Risk, benefits, and alternatives to EP study and radiofrequency ablation for afib were also discussed in detail today. These risks include but are not limited to stroke, bleeding, vascular damage, tamponade, perforation, damage to the esophagus, lungs, and other structures, pulmonary vein stenosis, worsening renal function, and death. The patient understands these risk and wishes to proceed.  We will therefore proceed with catheter ablation once the patient has been adequately anticoagulated. He is started on Xarelto 20mg  daily today (I have avoided pradaxa due to GERD). Given h/o snoring, I have recommended that he have a sleep study by Dr Anne Fu' office. He should also have a 2 D echo to assess his LA size and exclude structural heart disease.  I will ask Dr Anne Fu to assist with this also.

## 2011-07-29 NOTE — Progress Notes (Signed)
Primary Cardiologist:  Dr Anne Fu  The patient presents today for electrophysiology followup.  Since last being seen in our clinic, the patient reports doing reasonably well.  Unfortunately, his afib burden has increased.  He reports that stress and caffeine have been his afib triggers.  He has afib every few months.  His most recent episode occurred this past Wed am.  His afib terminated Thursday pm after taking an additional dose of diltiazem.   He reports palpitations and fatigue during his afib.  Today, he denies symptoms of  chest pain, shortness of breath, orthopnea, PND, lower extremity edema, dizziness, presyncope, syncope, or neurologic sequela.  + snores,  + GERD  The patient feels that he is tolerating medications without difficulties and is otherwise without complaint today.   Past Medical History  Diagnosis Date  . HTN (hypertension)   . Atrial flutter     s/p CTI ablation 2010 by Dr Johney Frame  . Atrial fibrillation     paroxysmal  . GERD (gastroesophageal reflux disease)    Past Surgical History  Procedure Date  . Cti ablation 7/10    for atrial flutter by Dr Johney Frame  . Hernia repair     Current Outpatient Prescriptions  Medication Sig Dispense Refill  . diltiazem (CARDIZEM CD) 240 MG 24 hr capsule Take 240 mg by mouth daily.        . flecainide (TAMBOCOR) 100 MG tablet Take 150 mg by mouth 2 (two) times daily.       Marland Kitchen omeprazole (PRILOSEC) 40 MG capsule Take 20 mg by mouth daily.       . Rivaroxaban (XARELTO) 20 MG TABS Take 20 mg by mouth daily.  30 tablet  6    No Known Allergies  History   Social History  . Marital Status: Legally Separated    Spouse Name: N/A    Number of Children: N/A  . Years of Education: N/A   Occupational History  . Not on file.   Social History Main Topics  . Smoking status: Never Smoker   . Smokeless tobacco: Not on file   Comment: denies any tobacco use   . Alcohol Use: Yes     lass than 1 alcoholic beverage/week   . Drug Use: Not  on file  . Sexually Active: Not on file   Other Topics Concern  . 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     Family History  Problem Relation Age of Onset  . Atrial fibrillation Neg Hx     ROS-  All systems are reviewed and are negative except as outlined in the HPI above  Physical Exam: Filed Vitals:   07/29/11 0926  BP: 131/75  Pulse: 58  Height: 5\' 11"  (1.803 m)  Weight: 232 lb (105.235 kg)      GEN- The patient is well appearing, alert and oriented x 3 today.   Head- normocephalic, atraumatic Eyes-  Sclera clear, conjunctiva pink Ears- hearing intact Oropharynx- clear Neck- supple, no JVP Lymph- no cervical lymphadenopathy Lungs- Clear to ausculation bilaterally, normal work of breathing Heart- Regular rate and rhythm, no murmurs, rubs or gallops, PMI not laterally displaced GI- soft, NT, ND, + BS Extremities- no clubbing, cyanosis, or edema MS- no significant deformity or atrophy Skin- no rash or lesion Psych- euthymic mood, full affect Neuro- strength and sensation are intact  ekg today reveals sinus bradycardia 58 bpm, PR 210, LPHB ekg from 07/24/11  reveals afib, V rate 83  Assessment and Plan:

## 2011-07-29 NOTE — Telephone Encounter (Signed)
Patient called back, unable to have procedure done on 2-28, would like to be done on 09-06-11.  Dr Anne Fu to do TEE on 09-05-11.  His office will call patient with instructions.  Pt also needs a sleep study that Dr Anne Fu' office will arrange.  Letter mailed to patient with instructions for ablation.

## 2011-07-29 NOTE — Assessment & Plan Note (Signed)
Stable No change required today  

## 2011-08-01 ENCOUNTER — Institutional Professional Consult (permissible substitution): Payer: BC Managed Care – PPO | Admitting: Internal Medicine

## 2011-08-30 ENCOUNTER — Encounter (HOSPITAL_COMMUNITY): Payer: Self-pay | Admitting: Pharmacy Technician

## 2011-09-05 ENCOUNTER — Encounter (HOSPITAL_COMMUNITY): Payer: Self-pay

## 2011-09-05 ENCOUNTER — Ambulatory Visit (HOSPITAL_COMMUNITY): Admit: 2011-09-05 | Payer: Self-pay | Admitting: Cardiology

## 2011-09-05 SURGERY — ECHOCARDIOGRAM, TRANSESOPHAGEAL
Anesthesia: Moderate Sedation

## 2011-09-06 ENCOUNTER — Encounter: Payer: Self-pay | Admitting: Internal Medicine

## 2011-09-06 ENCOUNTER — Other Ambulatory Visit: Payer: Self-pay | Admitting: Interventional Cardiology

## 2011-09-12 ENCOUNTER — Encounter (HOSPITAL_COMMUNITY)
Admission: RE | Disposition: A | Discharge status: Home / Self Care | Payer: Self-pay | Source: Ambulatory Visit | Attending: Interventional Cardiology

## 2011-09-12 ENCOUNTER — Ambulatory Visit (HOSPITAL_COMMUNITY)
Admission: RE | Admit: 2011-09-12 | Discharge: 2011-09-12 | Disposition: A | Discharge status: Home / Self Care | Payer: BC Managed Care – PPO | Source: Ambulatory Visit | Attending: Interventional Cardiology | Admitting: Interventional Cardiology

## 2011-09-12 ENCOUNTER — Encounter (HOSPITAL_COMMUNITY): Payer: Self-pay | Admitting: *Deleted

## 2011-09-12 DIAGNOSIS — I359 Nonrheumatic aortic valve disorder, unspecified: Secondary | ICD-10-CM | POA: Insufficient documentation

## 2011-09-12 DIAGNOSIS — I059 Rheumatic mitral valve disease, unspecified: Secondary | ICD-10-CM | POA: Insufficient documentation

## 2011-09-12 HISTORY — PX: TEE WITHOUT CARDIOVERSION: SHX5443

## 2011-09-12 SURGERY — ECHOCARDIOGRAM, TRANSESOPHAGEAL
Anesthesia: Moderate Sedation

## 2011-09-12 MED ORDER — MIDAZOLAM HCL 10 MG/2ML IJ SOLN
INTRAMUSCULAR | Status: DC | PRN
  Administered 2011-09-12 (×2): 2 mg via INTRAVENOUS
  Administered 2011-09-12: 1 mg via INTRAVENOUS

## 2011-09-12 MED ORDER — FENTANYL CITRATE 0.05 MG/ML IJ SOLN
INTRAMUSCULAR | Status: DC | PRN
  Administered 2011-09-12 (×3): 25 ug via INTRAVENOUS

## 2011-09-12 MED ORDER — FENTANYL CITRATE 0.05 MG/ML IJ SOLN
INTRAMUSCULAR | Status: AC
  Filled 2011-09-12: qty 2

## 2011-09-12 MED ORDER — SODIUM CHLORIDE 0.9 % IJ SOLN: 3.0000 mL | INTRAMUSCULAR | Status: DC | PRN

## 2011-09-12 MED ORDER — LIDOCAINE VISCOUS 2 % MT SOLN
OROMUCOSAL | Status: AC
  Filled 2011-09-12: qty 15

## 2011-09-12 MED ORDER — BENZOCAINE 20 % MT SOLN: 1.0000 "application " | OROMUCOSAL | Status: DC | PRN

## 2011-09-12 MED ORDER — FENTANYL CITRATE 0.05 MG/ML IJ SOLN: 250.0000 ug | Freq: Once | INTRAMUSCULAR | Status: DC

## 2011-09-12 MED ORDER — LIDOCAINE VISCOUS 2 % MT SOLN
OROMUCOSAL | Status: DC | PRN
  Administered 2011-09-12: 10 mL via OROMUCOSAL

## 2011-09-12 MED ORDER — MIDAZOLAM HCL 10 MG/2ML IJ SOLN
INTRAMUSCULAR | Status: AC
  Filled 2011-09-12: qty 2

## 2011-09-12 MED ORDER — SODIUM CHLORIDE 0.9 % IV SOLN
250.0000 mL | INTRAVENOUS | Status: DC | PRN
  Administered 2011-09-12: 500 mL via INTRAVENOUS

## 2011-09-12 MED ORDER — MIDAZOLAM HCL 10 MG/2ML IJ SOLN: 10.0000 mg | Freq: Once | INTRAMUSCULAR | Status: DC

## 2011-09-12 MED ORDER — SODIUM CHLORIDE 0.9 % IJ SOLN: 3.0000 mL | Freq: Two times a day (BID) | INTRAMUSCULAR | Status: DC

## 2011-09-12 NOTE — CV Procedure (Signed)
TEE performed.  Trace MR.  No LA/LAA thrombus.  Normal LV function.  Moderate descending aortic atherosclerosis.  See [facility] for full report.  Hammond Obeirne S. 09/12/2011

## 2011-09-12 NOTE — H&P (Signed)
Date of Initial H&P: 09/05/11  History reviewed, patient examined, no change in status, stable for surgery.  Carlos Lapierre S.

## 2011-09-12 NOTE — Discharge Instructions (Addendum)
Do not eat or drink after midnight.Transesophageal Echocardiography A transesophageal echocardiogram (TEE) is a special type of test that produces images of the heart by sound waves (echocardiogram). This type of echocardiogram can obtain better images of the heart than a standard echocardiogram. A TEE is done by passing a flexible tube down the esophagus. The heart is located in front of the esophagus. Because the heart and esophagus are close to one another, your caregiver can take very clear, detailed pictures of the heart via ultrasound waves. WHY HAVE A TEE? Your caregiver may need more information based on your medical condition. A TEE is usually performed due to the following:  Your caregiver needs more information based on standard echocardiogram findings.   If you had a stroke, this might have happened because a clot formed in your heart. A TEE can visualize different areas of the heart and check for clots.   To check valve anatomy and function. Your caregiver will especially look at the mitral valve.   To check for redness, soreness, and swelling (inflammation) on the inside lining of the heart (endocarditis).   To evaluate the dividing wall (septum) of the heart and presence of a hole that did not close after birth (patent foramen ovale, PFO).   To help diagnose a tear in the wall of the aorta (aortic dissection).   During cardiac valve surgery, a TEE probe is placed. This allows the surgeon to assess the valve repair before closing the chest.  LET YOUR CAREGIVER KNOW ABOUT:   Swallowing difficulties.   An esophageal obstruction.   Use of aspirin or antiplatelet therapy.  RISKS AND COMPLICATIONS  Though extremely rare, an esophageal tear (rupture) is a potential complication. BEFORE THE PROCEDURE   Arrive at least 1 hour before the procedure or as told by your caregiver.   Do not eat or drink for 6 hours before the procedure or as told by your caregiver.   An intravenous  (IV) access tube will be started in the arm.  PROCEDURE   A medicine to help you relax (sedative) will be given through the IV.   A medicine that numbs the area (local anesthetic) may be sprayed to the back of the throat.   Your blood pressure, heart rate, and breathing (vital signs) will be monitored during the procedure.   The TEE probe is a long, flexible tube. It is about the width of an adult male's index finger. The tip of the probe is placed into the back of the mouth and you will be asked to swallow. This helps to pass the tip of the probe into the esophagus. Once the tip of the probe is in the correct area, your caregiver can take pictures of the heart.   A TEE is usually not a painful procedure. You may feel the probe press against the back of the throat. The probe does not enter the trachea and does not affect your breathing.   Your time spent at the hospital is usually less than 2 hours.  AFTER THE PROCEDURE   You will be in bed, resting until you have fully returned to consciousness.   When you first awaken, your throat may feel slightly sore and will probably still feel numb. This will improve slowly over time.   You will not be allowed to eat or drink until it is clear that numbness has improved.   Once you have been able to drink, urinate, and sit on the edge of the bed without  feeling sick to your stomach (nauseous) or dizzy, you may be cleared to dress and go home.   Do not drive yourself home. You have had medications that can continue to make you feel drowsy and can impair your reflexes.   You should have a friend or family member with you for the next 24 hours after your examination.  Obtaining the test results It is your responsibility to obtain your test results. Ask the lab or department performing the test when and how you will get your results. SEEK IMMEDIATE MEDICAL CARE IF:   There is chest pain.   You have a hard time breathing or have shortness of  breath.   You cough or throw up (vomit) blood.  MAKE SURE YOU:   Understand these instructions.   Will watch this condition.   Will get help right away if you is not doing well or gets worse.  Document Released: 09/07/2002 Document Revised: 06/06/2011 Document Reviewed: 11/29/2008 [facility] Patient Information 2012 [place], [place].

## 2011-09-12 NOTE — Progress Notes (Signed)
  Echocardiogram Echocardiogram Transesophageal has been performed.  Jorje Guild [facility] 09/12/2011, 10:15 AM

## 2011-09-13 ENCOUNTER — Encounter (HOSPITAL_COMMUNITY): Payer: Self-pay | Admitting: Certified Registered Nurse Anesthetist

## 2011-09-13 ENCOUNTER — Encounter (HOSPITAL_COMMUNITY): Payer: Self-pay | Admitting: *Deleted

## 2011-09-13 ENCOUNTER — Ambulatory Visit (HOSPITAL_COMMUNITY)
Admission: RE | Admit: 2011-09-13 | Discharge: 2011-09-14 | Disposition: A | Discharge status: Home / Self Care | Payer: BC Managed Care – PPO | Source: Ambulatory Visit | Attending: Internal Medicine | Admitting: Internal Medicine

## 2011-09-13 ENCOUNTER — Encounter (HOSPITAL_COMMUNITY)
Admission: RE | Disposition: A | Discharge status: Home / Self Care | Payer: Self-pay | Source: Ambulatory Visit | Attending: Internal Medicine

## 2011-09-13 ENCOUNTER — Ambulatory Visit (HOSPITAL_COMMUNITY): Payer: BC Managed Care – PPO | Admitting: Certified Registered Nurse Anesthetist

## 2011-09-13 DIAGNOSIS — I4892 Unspecified atrial flutter: Secondary | ICD-10-CM

## 2011-09-13 DIAGNOSIS — K219 Gastro-esophageal reflux disease without esophagitis: Secondary | ICD-10-CM | POA: Insufficient documentation

## 2011-09-13 DIAGNOSIS — I4891 Unspecified atrial fibrillation: Secondary | ICD-10-CM

## 2011-09-13 DIAGNOSIS — I10 Essential (primary) hypertension: Secondary | ICD-10-CM | POA: Insufficient documentation

## 2011-09-13 DIAGNOSIS — I480 Paroxysmal atrial fibrillation: Secondary | ICD-10-CM | POA: Insufficient documentation

## 2011-09-13 HISTORY — PX: ATRIAL FIBRILLATION ABLATION: SHX5456

## 2011-09-13 LAB — POCT ACTIVATED CLOTTING TIME
Activated Clotting Time: 314 seconds
Activated Clotting Time: 314 seconds
Activated Clotting Time: 287 seconds
Activated Clotting Time: 314 seconds

## 2011-09-13 SURGERY — ATRIAL FIBRILLATION ABLATION
Anesthesia: General

## 2011-09-13 MED ORDER — SODIUM CHLORIDE 0.9 % IV SOLN: 250.0000 mL | INTRAVENOUS | Status: DC | PRN

## 2011-09-13 MED ORDER — SODIUM CHLORIDE 0.45 % IV SOLN: INTRAVENOUS | Status: DC

## 2011-09-13 MED ORDER — SODIUM CHLORIDE 0.9 % IJ SOLN: 3.0000 mL | INTRAMUSCULAR | Status: DC | PRN

## 2011-09-13 MED ORDER — DILTIAZEM HCL ER COATED BEADS 240 MG PO CP24
240.0000 mg | ORAL_CAPSULE | Freq: Every day | ORAL | Status: DC
  Administered 2011-09-14: 240 mg via ORAL
  Filled 2011-09-13 (×2): qty 1

## 2011-09-13 MED ORDER — PROTAMINE SULFATE 10 MG/ML IV SOLN
INTRAVENOUS | Status: DC | PRN
  Administered 2011-09-13: 30 mg via INTRAVENOUS

## 2011-09-13 MED ORDER — SODIUM CHLORIDE 0.9 % IJ SOLN: 3.0000 mL | Freq: Two times a day (BID) | INTRAMUSCULAR | Status: DC

## 2011-09-13 MED ORDER — BENZOCAINE 20 % MT SOLN
1.0000 "application " | OROMUCOSAL | Status: DC | PRN
  Filled 2011-09-13: qty 57

## 2011-09-13 MED ORDER — ONDANSETRON HCL 4 MG/2ML IJ SOLN: 4.0000 mg | Freq: Once | INTRAMUSCULAR | Status: DC | PRN

## 2011-09-13 MED ORDER — PROPOFOL 10 MG/ML IV EMUL
INTRAVENOUS | Status: DC | PRN
  Administered 2011-09-13: 160 mg via INTRAVENOUS
  Administered 2011-09-13: 40 mg via INTRAVENOUS

## 2011-09-13 MED ORDER — MIDAZOLAM HCL 10 MG/2ML IJ SOLN: 10.0000 mg | Freq: Once | INTRAMUSCULAR | Status: DC

## 2011-09-13 MED ORDER — SODIUM CHLORIDE 0.9 % IV SOLN
INTRAVENOUS | Status: DC
  Administered 2011-09-13 (×2): via INTRAVENOUS

## 2011-09-13 MED ORDER — ACETAMINOPHEN 325 MG PO TABS: 650.0000 mg | ORAL_TABLET | ORAL | Status: DC | PRN

## 2011-09-13 MED ORDER — HYDROCODONE-ACETAMINOPHEN 5-325 MG PO TABS: 1.0000 | ORAL_TABLET | ORAL | Status: DC | PRN

## 2011-09-13 MED ORDER — HEPARIN SODIUM (PORCINE) 1000 UNIT/ML IJ SOLN
INTRAMUSCULAR | Status: DC | PRN
  Administered 2011-09-13: 3 mL via INTRAVENOUS
  Administered 2011-09-13: 2 mL via INTRAVENOUS
  Administered 2011-09-13: 10 mL via INTRAVENOUS

## 2011-09-13 MED ORDER — FENTANYL CITRATE 0.05 MG/ML IJ SOLN
INTRAMUSCULAR | Status: DC | PRN
  Administered 2011-09-13: 25 ug via INTRAVENOUS
  Administered 2011-09-13: 100 ug via INTRAVENOUS
  Administered 2011-09-13 (×2): 25 ug via INTRAVENOUS

## 2011-09-13 MED ORDER — HYDROMORPHONE HCL PF 1 MG/ML IJ SOLN: 0.2500 mg | INTRAMUSCULAR | Status: DC | PRN

## 2011-09-13 MED ORDER — ISOPROTERENOL HCL 0.2 MG/ML IJ SOLN
1000.0000 ug | INTRAVENOUS | Status: DC | PRN
  Administered 2011-09-13: 20 ug/min via INTRAVENOUS

## 2011-09-13 MED ORDER — PROPOFOL 10 MG/ML IV EMUL
INTRAVENOUS | Status: DC | PRN
  Administered 2011-09-13: 100 ug/kg/min via INTRAVENOUS

## 2011-09-13 MED ORDER — HEPARIN SODIUM (PORCINE) 1000 UNIT/ML IJ SOLN
INTRAMUSCULAR | Status: AC
  Filled 2011-09-13: qty 1

## 2011-09-13 MED ORDER — BUPIVACAINE HCL (PF) 0.25 % IJ SOLN
INTRAMUSCULAR | Status: AC
  Filled 2011-09-13: qty 30

## 2011-09-13 MED ORDER — LIDOCAINE HCL (CARDIAC) 20 MG/ML IV SOLN
INTRAVENOUS | Status: DC | PRN
  Administered 2011-09-13: 80 mg via INTRAVENOUS

## 2011-09-13 MED ORDER — PANTOPRAZOLE SODIUM 40 MG PO TBEC
40.0000 mg | DELAYED_RELEASE_TABLET | Freq: Every day | ORAL | Status: DC
  Administered 2011-09-13: 40 mg via ORAL
  Filled 2011-09-13: qty 1

## 2011-09-13 MED ORDER — ONDANSETRON HCL 4 MG/2ML IJ SOLN
4.0000 mg | Freq: Four times a day (QID) | INTRAMUSCULAR | Status: DC | PRN
  Administered 2011-09-13: 4 mg via INTRAVENOUS
  Filled 2011-09-13: qty 2

## 2011-09-13 MED ORDER — FLECAINIDE ACETATE 100 MG PO TABS
100.0000 mg | ORAL_TABLET | Freq: Two times a day (BID) | ORAL | Status: DC
  Administered 2011-09-13 – 2011-09-14 (×2): 100 mg via ORAL
  Filled 2011-09-13 (×4): qty 1

## 2011-09-13 MED ORDER — RIVAROXABAN 10 MG PO TABS
20.0000 mg | ORAL_TABLET | Freq: Every evening | ORAL | Status: DC
  Administered 2011-09-13: 20 mg via ORAL
  Filled 2011-09-13 (×2): qty 2

## 2011-09-13 MED ORDER — HYDROXYUREA 500 MG PO CAPS
ORAL_CAPSULE | ORAL | Status: AC
  Filled 2011-09-13: qty 1

## 2011-09-13 NOTE — Preoperative (Signed)
Beta Blockers   Reason not to administer Beta Blockers:Not Applicable 

## 2011-09-13 NOTE — H&P (Signed)
Primary Cardiologist: Dr Anne Fu   The patient presents today for electrophysiology followup. Since last being seen in our clinic, the patient reports doing reasonably well. Unfortunately, his afib burden has increased. He reports that stress and caffeine have been his afib triggers. He has afib every few months. His most recent episode occurred this past Wed am. His afib terminated Thursday pm after taking an additional dose of diltiazem. He reports palpitations and fatigue during his afib. Today, he denies symptoms of chest pain, shortness of breath, orthopnea, PND, lower extremity edema, dizziness, presyncope, syncope, or neurologic sequela. + snores, + GERD The patient feels that he is tolerating medications without difficulties and is otherwise without complaint today.   Past Medical History   Diagnosis  Date   .  HTN (hypertension)    .  Atrial flutter      s/p CTI ablation 2010 by Dr Johney Frame   .  Atrial fibrillation      paroxysmal   .  GERD (gastroesophageal reflux disease)     Past Surgical History   Procedure  Date   .  Cti ablation  7/10     for atrial flutter by Dr Johney Frame   .  Hernia repair     Current Outpatient Prescriptions   Medication  Sig  Dispense  Refill   .  diltiazem (CARDIZEM CD) 240 MG 24 hr capsule  Take 240 mg by mouth daily.     .  flecainide (TAMBOCOR) 100 MG tablet  Take 150 mg by mouth 2 (two) times daily.     Marland Kitchen  omeprazole (PRILOSEC) 40 MG capsule  Take 20 mg by mouth daily.     .  Rivaroxaban (XARELTO) 20 MG TABS  Take 20 mg by mouth daily.  30 tablet  6    No Known Allergies  History    Social History   .  Marital Status:  Legally Separated     Spouse Name:  N/A     Number of Children:  N/A   .  Years of Education:  N/A    Occupational History   .  Not on file.    Social History Main Topics   .  Smoking status:  Never Smoker   .  Smokeless tobacco:  Not on file     Comment: denies any tobacco use    .  Alcohol Use:  Yes      lass than 1  alcoholic beverage/week   .  Drug Use:  Not on file   .  Sexually Active:  Not on file    Other Topics  Concern   .  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    Family History   Problem  Relation  Age of Onset   .  Atrial fibrillation  Neg Hx     ROS- All systems are reviewed and are negative except as outlined in the HPI above  Physical Exam:  Filed Vitals:    07/29/11 0926   BP:  131/75   Pulse:  58   Height:  5\' 11"  (1.803 m)   Weight:  232 lb (105.235 kg)    GEN- The patient is well appearing, alert and oriented x 3 today.  Head- normocephalic, atraumatic  Eyes- Sclera clear, conjunctiva pink  Ears- hearing intact  Oropharynx- clear  Neck- supple, no JVP  Lymph- no cervical lymphadenopathy  Lungs- Clear to ausculation  bilaterally, normal work of breathing  Heart- Regular rate and rhythm, no murmurs, rubs or gallops, PMI not laterally displaced  GI- soft, NT, ND, + BS  Extremities- no clubbing, cyanosis, or edema  MS- no significant deformity or atrophy  Skin- no rash or lesion  Psych- euthymic mood, full affect  Neuro- strength and sensation are intact  ekg today reveals sinus bradycardia 58 bpm, PR 210, LPHB  ekg from 07/24/11 reveals afib, V rate 83   Assessment and Plan:   ATRIAL FIBRILLATION - Hillis Range, MD 07/29/2011 11:03 AM Signed  The patient has symptomatic atrial fibrillation. His afib burden is increasing despite medical therapy with flecainide.  Therapeutic strategies for afib including medicine and ablation were discussed in detail with the patient today. Risk, benefits, and alternatives to EP study and radiofrequency ablation for afib were also discussed in detail today. These risks include but are not limited to stroke, bleeding, vascular damage, tamponade, perforation, damage to the esophagus, lungs, and other structures, pulmonary vein stenosis, worsening renal function, and death. The  patient understands these risk and wishes to proceed.  He reports compliance with xarelto without interruption for the past 30 days (last dose last night).

## 2011-09-13 NOTE — Anesthesia Procedure Notes (Signed)
Procedure Name: MAC and LMA Insertion Date/Time: 09/13/2011 7:59 AM Performed by: Kirt Boys P Patient Re-evaluated:Patient Re-evaluated prior to inductionOxygen Delivery Method: Circle system utilized Preoxygenation: Pre-oxygenation with 100% oxygen Intubation Type: IV induction Ventilation: Mask ventilation without difficulty LMA: LMA with gastric port inserted LMA Size: 4.0 Number of attempts: 1 Placement Confirmation: positive ETCO2 and breath sounds checked- equal and bilateral Tube secured with: Tape Dental Injury: Teeth and Oropharynx as per pre-operative assessment

## 2011-09-13 NOTE — Op Note (Signed)
SURGEON:  Hillis Range, MD  PREPROCEDURE DIAGNOSES: 1. Paroxysmal atrial fibrillation.  POSTPROCEDURE DIAGNOSES: 1. Paroxysmal  atrial fibrillation.  PROCEDURES: 1. Comprehensive electrophysiologic study. 2. Coronary sinus pacing and recording. 3. Three-dimensional mapping of supraventricular tachycardia (afib mapping/ablation and additional mapping and ablation in the right atrium). 4. Ablation of supraventricular tachycardia (afib mapping/ablation and additional mapping and ablation in the right atrium).. 5. Arterial blood pressure monitoring. 6. Intracardiac echocardiography. 7. Transseptal puncture of an intact septum. 8. Rotational Angiography with processing at an independent workstation 9. Arrhythmia induction with pacing with isuprel infusion  INTRODUCTION:  Carlos Thomas is a 61 y.o. male with a history of paroxysmal atrial fibrillation who now presents for EP study and radiofrequency ablation.  The patient reports initially being diagnosed with atrial fibrillation several years ago after presenting with symptomatic palpitations and fatgiue.  His arrhythmia at that time was predominantly typical atrial flutter.  He therefore underwent CTI ablation by me 2010.   The patient reports increasing frequency and duration of atrial fibrillation since that time.  The patient has failed medical therapy with flecainide.  The patient therefore presents today for catheter ablation of atrial fibrillation.  DESCRIPTION OF PROCEDURE:  Informed written consent was obtained, and the patient was brought to the electrophysiology lab in a fasting state.  The patient was adequately sedated with intravenous medications as outlined in the anesthesia report.  The patient's left and right groins were prepped and draped in the usual sterile fashion by the EP lab staff.  Using a percutaneous Seldinger technique, two 7-French and one 8-French hemostasis sheaths were placed into the right common femoral vein.  A  4- [place] hemostasis sheath was placed in the right common femoral artery for blood pressure monitoring.  An 11-French hemostasis sheath was placed into the left common femoral vein.   3 Dimensional Rotational Angiography: A 5 french pigtail catheter was introduced through the right common femoral vein and advanced into the inferior venocava.  3 demential rotational angiography was then performed by power injection of 100cc of nonionic contrast.  Reprocessing at an independent work station was then performed.   This demonstrated a moderate sized left atrium with 4 separate pulmonary veins.  The right superior vein was large and the right inferior vein was small in size.  The left superior and inferior veins were moderate in size.  There were no anomalous veins or significant abnormalities.  A 3 dimensional rendering of the left atrium was then merged using [employer] onto the [employer] system and registered with intracardiac echo (see below).  The pigtail catheter was then removed.  Catheter Placement:  A 7-French Biosense Webster Decapolar coronary sinus catheter was introduced through the right common femoral vein and advanced into the coronary sinus for recording and pacing from this location.  A 6-French quadripolar Josephson catheter was introduced through the right common femoral vein and advanced into the right ventricle for recording and pacing.  This catheter was then pulled back to the His bundle location.    Initial Measurements: The patient presented to the electrophysiology lab in sinus rhythm.  His PR interval measured 210 with a QRS duringation of 106 and a QT interval of 412 msec.  The AH interval measured 127 and the HV interval measured 63 msec.     Intracardiac Echocardiography: A 10-French Biosense Webster AcuNav intracardiac echocardiography catheter was introduced through the left common femoral vein and advanced into the right atrium. Intracardiac echocardiography  was performed of the left  atrium, and a three-dimensional anatomical rendering of the left atrium was performed using CARTO sound technology.  The patient was noted to have a moderate sized left atrium.  The interatrial septum was prominent but not aneurysmal. All 4 pulmonary veins were visualized and noted to have separate ostia.   The right superior vein was large (25mm) and the right inferior vein was small in size (15 mmm).  The left superior and inferior veins were moderate in size (20mm).   The left atrial appendage was visualized and did not reveal thrombus.   There was no evidence of pulmonary vein stenosis.   Transseptal Puncture: The middle right common femoral vein sheath was exchanged for an 8.5 [place] SL2 transseptal sheath and transseptal access was achieved in a standard fashion using a Brockenbrough needle under biplane fluoroscopy with intracardiac echocardiography confirmation of the transseptal puncture.  Once transseptal access had been achieved, heparin was administered intravenously and intra- arterially in order to maintain an ACT of greater than 350 seconds throughout the procedure.   3D Mapping and Ablation: The His bundle catheter was removed and in its place a 3.5 mm Biosense Webster EZ [employer] ablation catheter was advanced into the right atrium.  The transseptal sheath was pulled back into the IVC over a guidewire.  The ablation catheter was advanced across the transseptal hole using the wire as a guide.  The transseptal sheath was then re-advanced over the guidewire into the left atrium.  A duodecapolar Biosense Webster circular mapping catheter was introduced through the transseptal sheath and positioned over the mouth of all 4 pulmonary veins.  Three-dimensional electroanatomical mapping was performed using CARTO technology.  This demonstrated electrical activity within all four pulmonary veins at baseline. The patient underwent successful sequential electrical  isolation and anatomical encircling of all four pulmonary veins using radiofrequency current with a circular mapping catheter as a guide.  The right inferior pulmonary vein was small and very inferiorly directed.  This vein could not be engaged with the lasso catheter (even 15mm) and therefore confirmation of isolation of this vein could not be demonstrated.  This vein was anatomically encircled.  Measurements Following Ablation: Following ablation, Isuprel was infused up to 20 mcg/min with no inducible atrial fibrillation, atrial tachycardia, atrial flutter, or sustained PACs. In sinus rhythm with RR interval was 895, with PR 180 msec, QRS 130 msec, and Qtc 441 msec.  Following ablation the AH interval measured 107 msec with an HV interval of 66 msec. Ventricular pacing was performed, which revealed midline decremental VA conduction with a VA Wenckebach cycle length of 640 msec.  Rapid atrial pacing was performed, which revealed an AV Wenckebach cycle length of 390 msec.  Electroisolation was then again confirmed in the pulmonary veins (not the RIPV).  Additional Right atrial mapping and ablation: The circular mapping catheter was pulled back into the right atrium and 3D mapping was performed at the junction of the superior vena cava and right atrium.  Electrical activity was observed within the SVC.  I therefore elected to perform right atrial ablation in this area.  A series of radiofrequency applications were delivered in a circular fashion around the ostium of the SVC.  Prior to each ablation lesions, pacing was performed from the distal ablation electrode to insure that diaphragmatic stimulation was not observed to avoid phrenic nerve injury.  Diaphragmatic excursion was also observed during ablation.    Given the patient's prior CTI ablation, I also elected to check the isthmus for conduction.  Differential atrial pacing was performed from the low lateral right atrium and complete bidirectional  isthmus block was confirmed with a stimulus to earliest atrial activation recorded bidirectionally across the isthmus measuring >253msec.  Intracardiac echocardiography was again performed, which revealed no pericardial effusion.  The procedure was therefore considered completed.  All catheters were removed, and the sheaths were aspirated and flushed.  The patient was transferred to the recovery area for sheath removal per protocol.  A limited bedside transthoracic echocardiogram revealed no pericardial effusion.  There were no early apparent complications.  CONCLUSIONS: 1. Sinus rhythm upon presentation.   2. Rotational Angiography reveals a moderate sized left atrium with four separate pulmonary veins without evidence of pulmonary vein stenosis. 3. Successful electrical isolation and anatomical encircling of all four pulmonary veins with radiofrequency current. 4. No inducible arrhythmias following ablation both on and off of Isuprel 5. Additional right atrial ablation performed at the junction of the SVC and right atrium 6. Complete bidirectional CTI block confirmed from a prior ablation 7. No early apparent complications.   Devra Stare,MD 11:18 AM 09/13/2011

## 2011-09-13 NOTE — Anesthesia Postprocedure Evaluation (Signed)
  Anesthesia Post-op Note  Patient: Carlos Thomas  Procedure(s) Performed: Procedure(s) (LRB): ATRIAL FIBRILLATION ABLATION (N/A)  Patient Location: PACU  Anesthesia Type: MAC  Level of Consciousness: awake, alert , oriented and patient cooperative  Airway and Oxygen Therapy: Patient Spontanous Breathing and Patient connected to nasal cannula oxygen  Post-op Pain: none  Post-op Assessment: Post-op Vital signs reviewed, Patient's Cardiovascular Status Stable, Respiratory Function Stable, Patent Airway, No signs of Nausea or vomiting and Pain level controlled  Post-op Vital Signs: Reviewed and stable  Complications: No apparent anesthesia complications

## 2011-09-13 NOTE — Anesthesia Preprocedure Evaluation (Addendum)
Anesthesia Evaluation  Patient identified by MRN, date of birth, ID band Patient awake    Reviewed: Allergy & Precautions, H&P , NPO status   Airway Mallampati: II TM Distance: >3 FB Neck ROM: Full    Dental  (+) Dental Advisory Given and Teeth Intact   Pulmonary  Pneumonia 2004         Cardiovascular hypertension, Pt. on medications + dysrhythmias Atrial Fibrillation Rhythm:irregular Rate:Tachycardia     Neuro/Psych    GI/Hepatic hiatal hernia, GERD-  Medicated and Controlled,  Endo/Other    Renal/GU      Musculoskeletal   Abdominal (+) + obese,   Peds  Hematology   Anesthesia Other Findings   Reproductive/Obstetrics                          Anesthesia Physical Anesthesia Plan  ASA: II  Anesthesia Plan: General and General LMA   Post-op Pain Management:    Induction: Intravenous  Airway Management Planned: LMA  Additional Equipment:   Intra-op Plan:   Post-operative Plan: Extubation in OR  Informed Consent: I have reviewed the patients History and Physical, chart, labs and discussed the procedure including the risks, benefits and alternatives for the proposed anesthesia with the patient or authorized representative who has indicated his/her understanding and acceptance.   Dental advisory given  Plan Discussed with: CRNA  Anesthesia Plan Comments:        Anesthesia Quick Evaluation

## 2011-09-13 NOTE — Transfer of Care (Signed)
Immediate Anesthesia Transfer of Care Note  Patient: Carlos Thomas  Procedure(s) Performed: Procedure(s) (LRB): ATRIAL FIBRILLATION ABLATION (N/A)  Patient Location: PACU  Anesthesia Type: MAC  Level of Consciousness: awake, alert , oriented and patient cooperative  Airway & Oxygen Therapy: Patient Spontanous Breathing and Patient connected to nasal cannula oxygen  Post-op Assessment: Report given to PACU RN, Post -op Vital signs reviewed and stable and Patient moving all extremities X 4  Post vital signs: Reviewed and stable  Complications: No apparent anesthesia complications

## 2011-09-14 DIAGNOSIS — I4891 Unspecified atrial fibrillation: Secondary | ICD-10-CM

## 2011-09-14 MED ORDER — FLECAINIDE ACETATE 100 MG PO TABS: 100.0000 mg | ORAL_TABLET | Freq: Two times a day (BID) | ORAL | Status: DC

## 2011-09-14 NOTE — Progress Notes (Signed)
Patient ID: Carlos Thomas, male   DOB: 1950/12/15, 61 y.o.   MRN: [id number] @ Subjective:  Denies SSCP, palpitations or Dyspnea   Objective:  Filed Vitals:   09/13/11 1942 09/14/11 0000 09/14/11 0332 09/14/11 0800  BP:  128/71 123/66 132/75  Pulse: 64     Temp: 99.3 F (37.4 C) 99.5 F (37.5 C) 99.3 F (37.4 C) 99.2 F (37.3 C)  TempSrc: Oral Oral Oral Oral  Resp: 18 15 18    Height:      Weight:      SpO2: 97% 96% 96%     Intake/Output from previous day:  Intake/Output Summary (Last 24 hours) at 09/14/11 0931 Last data filed at 09/14/11 0900  Gross per 24 hour  Intake   2102 ml  Output    700 ml  Net   1402 ml    Physical Exam: Affect appropriate Healthy:  appears stated age HEENT: normal Neck supple with no adenopathy JVP normal no bruits no thyromegaly Lungs clear with no wheezing and good diaphragmatic motion Heart:  S1/S2 no murmur, no rub, gallop or click PMI normal Abdomen: benighn, BS positve, no tenderness, no AAA no bruit.  No HSM or HJR Distal pulses intact with no bruits No edema Neuro non-focal Skin warm and dry No muscular weakness Cath site right groin A   Imaging: No results found.  Cardiac Studies:  ECG:  09/14/11 SR 67  LPFB no acute ischemic/pericardial chages no afib or flutter   Telemetry:  NSR   Medications:     . diltiazem  240 mg Oral Daily  . flecainide  100 mg Oral BID  . hydroxyurea      . midazolam  10 mg Intravenous Once  . pantoprazole  40 mg Oral Q1200  . rivaroxaban  20 mg Oral QPM  . sodium chloride  3 mL Intravenous Q12H  . sodium chloride  3 mL Intravenous Q12H       . sodium chloride    . DISCONTD: sodium chloride      Assessment/Plan:  Afib/Flutter:  Successful ablation.  Decrease Flecainide to 100 bid Anticoagulation:  rivaroxaban no bleeding from cath site GI:  Prophylaxis with protonix for 6 weeks post ablation  Charlton Haws 09/14/2011, 9:31 AM

## 2011-09-14 NOTE — Discharge Summary (Signed)
Patient ID: Carlos Thomas,  MRN: [id number], DOB/AGE: 1950-08-09 61 y.o.  Admit date: 09/13/2011 Discharge date: 09/14/2011  Primary Cardiologist: M. Anne Fu, MD Primary Electrophysiologist: J. Allred, MD  Discharge Diagnoses Principal Problem:  *Atrial fibrillation Active Problems:  HYPERTENSION  Atrial flutter  Allergies No Known Allergies  Procedures  3.15.2013 EP study with atrial fibrillation radiofrequency catheter ablation  CONCLUSIONS:  1. Sinus rhythm upon presentation.  2. Rotational Angiography reveals a moderate sized left atrium with four separate pulmonary veins without evidence of pulmonary vein stenosis.  3. Successful electrical isolation and anatomical encircling of all four pulmonary veins with radiofrequency current.  4. No inducible arrhythmias following ablation both on and off of Isuprel  5. Additional right atrial ablation performed at the junction of the SVC and right atrium  6. Complete bidirectional CTI block confirmed from a prior ablation  7. No early apparent complications.  History of Present Illness  61 y/o male with the above problem list who was seen in clinic by Dr. Johney Frame in late January of this year secondary to recurrent PAF.  He was placed on Rivaroxaban therapy and decision was made to pursue a-fib ablation.  Pt first underwent TEE on 09/12/2011, which showed normal LV function w/o evidence of LA or LAA thrombus.  Hospital Course  Pt presented to the EP lab on 09/13/2011.  He underwent EP study followed by successful a-fib ablation.  He tolerated procedure well and post-procedure has had no recurrence of atrial fibrillation.  He will be discharged home today.  We have reduced his home flecainide dose to 100mg  bid.  Discharge Vitals Blood pressure 132/75, pulse 64, temperature 99.2 F (37.3 C), temperature source Oral, resp. rate 18, height 5\' 11"  (1.803 m), weight 229 lb 11.5 oz (104.2 kg), SpO2 96.00%.  Filed Weights   09/13/11 0602  09/13/11 1246  Weight: 230 lb (104.327 kg) 229 lb 11.5 oz (104.2 kg)    Labs  None  Disposition  Pt is being discharged home today in good condition.  Follow-up Plans & Appointments  Follow-up Information    Follow up with Hillis Range, MD in 12 weeks. (We will arrange.)    Contact information:   [address] [place] [address] [phone]       Follow up with Donato Schultz, MD. (As scheduled)    Contact information:   [address] [place] [address] [phone]          Discharge Medications  Medication List  As of 09/14/2011  9:45 AM   TAKE these medications         diltiazem 240 MG 24 hr capsule   Commonly known as: CARDIZEM CD   Take 240 mg by mouth daily.      flecainide 100 MG tablet   Commonly known as: TAMBOCOR   Take 1 tablet (100 mg total) by mouth 2 (two) times daily.      omeprazole 20 MG capsule   Commonly known as: PRILOSEC   Take 20 mg by mouth daily.      Rivaroxaban 20 MG Tabs   Take 20 mg by mouth daily.            Outstanding Labs/Studies  None  Duration of Discharge Encounter   Greater than 30 minutes including physician time.  Signed, Nicolasa Ducking NP 09/14/2011, 9:45 AM

## 2011-09-14 NOTE — Discharge Instructions (Signed)
***  PLEASE REMEMBER TO BRING ALL OF YOUR MEDICATIONS TO EACH OF YOUR FOLLOW-UP OFFICE VISITS.  NO HEAVY LIFTING OR SEXUAL ACTIVITY X 7 DAYS. NO DRIVING X 2-3 DAYS. NO SOAKING BATHS, HOT TUBS, POOLS, ETC., X 7 DAYS.  

## 2011-09-16 ENCOUNTER — Telehealth: Payer: Self-pay | Admitting: *Deleted

## 2011-09-16 ENCOUNTER — Telehealth: Payer: Self-pay | Admitting: Internal Medicine

## 2011-09-16 MED ORDER — DILTIAZEM HCL ER COATED BEADS 240 MG PO CP24: 240.0000 mg | ORAL_CAPSULE | Freq: Every day | ORAL | Status: DC

## 2011-09-16 NOTE — Telephone Encounter (Signed)
Pt wants to know if he is still to be taking cardizem or not? pls call [phone]

## 2011-09-16 NOTE — Telephone Encounter (Signed)
Please continue to take the Cardizem 240mg  daily

## 2011-09-16 NOTE — Telephone Encounter (Signed)
Pt wants to know if he is still to be taking the Cardizem or not?

## 2011-09-18 ENCOUNTER — Telehealth: Payer: Self-pay | Admitting: Internal Medicine

## 2011-09-18 NOTE — Telephone Encounter (Signed)
Spoke with patient and let him know that these signs and symptoms are normal  Dr Johney Frame said he would okay for him to be out through 09/25/11.

## 2011-09-18 NOTE — Telephone Encounter (Signed)
Pt having nodules on both side of groin, right side more so, bruising and discomfort, pt [phone]

## 2011-09-19 ENCOUNTER — Encounter: Payer: Self-pay | Admitting: *Deleted

## 2011-09-19 NOTE — Telephone Encounter (Signed)
Pt will come by and pick up letter on Monday

## 2011-12-11 ENCOUNTER — Encounter: Payer: Self-pay | Admitting: Internal Medicine

## 2011-12-11 ENCOUNTER — Ambulatory Visit (INDEPENDENT_AMBULATORY_CARE_PROVIDER_SITE_OTHER): Payer: BC Managed Care – PPO | Admitting: Internal Medicine

## 2011-12-11 VITALS — BP 136/68 | HR 66 | Resp 18 | Ht 71.0 in | Wt 232.4 lb

## 2011-12-11 DIAGNOSIS — I10 Essential (primary) hypertension: Secondary | ICD-10-CM

## 2011-12-11 DIAGNOSIS — I4891 Unspecified atrial fibrillation: Secondary | ICD-10-CM

## 2011-12-11 MED ORDER — FLECAINIDE ACETATE 100 MG PO TABS: ORAL_TABLET | ORAL | Status: DC

## 2011-12-11 NOTE — Assessment & Plan Note (Signed)
Doing well s/p ablation Decrease flecainide to 50mg  BID x 4 weeks then DC Consider stopping xarelto (CHADSVASC is 1) upon return in 3 months

## 2011-12-11 NOTE — Patient Instructions (Addendum)
Decrease Flecainide to 1/2 tablet twice daily for 4 weeks.  If no afib at that point, ok to stop medication.  Your physician recommends that you schedule a follow-up appointment in: 12 weeks with Dr Johney Frame.

## 2011-12-11 NOTE — Progress Notes (Signed)
  Primary Cardiologist:  Dr Phineas Semen CORION SHERROD is a 61 y.o. male who presents today for routine electrophysiology followup.  Since his recent afib ablation, the patient reports doing very well.  He denies procedure related complications.  He thinks that he may have had afib (ERAF) last week x 4 hours but denies any other episodes.  Today, he denies symptoms of chest pain, shortness of breath,  lower extremity edema, dizziness, presyncope, or syncope.  The patient is otherwise without complaint today.   Past Medical History  Diagnosis Date  . HTN (hypertension)   . Atrial flutter     s/p CTI ablation 2010 by Dr Johney Frame  . Atrial fibrillation     paroxysmal, s/p afib ablation 3/13 by Dr Johney Frame  . GERD (gastroesophageal reflux disease)   . Pneumonia     hx of pneumonia  . H/O hiatal hernia    Past Surgical History  Procedure Date  . Cti ablation 7/10    for atrial flutter by Dr Johney Frame  . Hernia repair   . Cholecystectomy   . Tee without cardioversion 09/12/2011    Procedure: TRANSESOPHAGEAL ECHOCARDIOGRAM (TEE);  Surgeon: Corky Crafts, MD;  Location: [facility] ENDOSCOPY;  Service: Cardiovascular;  Laterality: N/A;  . Atrial fibrillation ablation 09/13/11    PVI by Dr Johney Frame    Current Outpatient Prescriptions  Medication Sig Dispense Refill  . diltiazem (CARDIZEM CD) 240 MG 24 hr capsule Take 1 capsule (240 mg total) by mouth daily.  30 capsule  11  . flecainide (TAMBOCOR) 100 MG tablet Take 1/2 tablet twice daily for 4 weeks.  If no afib at that point, ok to stop medication.  60 tablet  6  . omeprazole (PRILOSEC) 20 MG capsule Take 20 mg by mouth daily.      . Rivaroxaban (XARELTO) 20 MG TABS Take 1 tablet by mouth daily.      Marland Kitchen DISCONTD: flecainide (TAMBOCOR) 100 MG tablet Take 1 tablet (100 mg total) by mouth 2 (two) times daily.  60 tablet  6    Physical Exam: Filed Vitals:   12/11/11 1015  BP: 136/68  Pulse: 66  Resp: 18  Height: 5\' 11"  (1.803 m)  Weight: 232 lb 6.4  oz (105.416 kg)    GEN- The patient is well appearing, alert and oriented x 3 today.   Head- normocephalic, atraumatic Eyes-  Sclera clear, conjunctiva pink Ears- hearing intact Oropharynx- clear Lungs- Clear to ausculation bilaterally, normal work of breathing Heart- Regular rate and rhythm, no murmurs, rubs or gallops, PMI not laterally displaced GI- soft, NT, ND, + BS Extremities- no clubbing, cyanosis, or edema  ekg today reveals sinus rhythm 55 bpm, PR 186, Qtc 405  Assessment and Plan:

## 2011-12-11 NOTE — Assessment & Plan Note (Signed)
Stable No change required today  

## 2011-12-16 NOTE — Addendum Note (Signed)
Addended by: Reine Just on: 12/16/2011 03:57 PM   Modules accepted: Orders

## 2012-02-25 ENCOUNTER — Other Ambulatory Visit: Payer: Self-pay | Admitting: Internal Medicine

## 2012-03-05 ENCOUNTER — Ambulatory Visit (INDEPENDENT_AMBULATORY_CARE_PROVIDER_SITE_OTHER): Payer: BC Managed Care – PPO | Admitting: Internal Medicine

## 2012-03-05 ENCOUNTER — Encounter: Payer: Self-pay | Admitting: Internal Medicine

## 2012-03-05 VITALS — BP 128/78 | HR 63 | Ht 71.0 in | Wt 233.0 lb

## 2012-03-05 DIAGNOSIS — I4891 Unspecified atrial fibrillation: Secondary | ICD-10-CM

## 2012-03-05 NOTE — Assessment & Plan Note (Signed)
Maintaining sinus rhythm off of AAD Stop xarelto today (CHADS2VASC score is 1) Continue cardizem (consider stopping if no further afib upon return)   Return in 6 months

## 2012-03-05 NOTE — Progress Notes (Signed)
Primary Cardiologist:  Dr Phineas Semen Carlos Thomas is a 61 y.o. male who presents today for routine electrophysiology followup.  Since last being seen in our clinic, the patient reports doing very well.  He has had rare palpitations but no sustained afib.  He is very pleased with the results of his ablation.  Today, he denies symptoms of  chest pain, shortness of breath,  lower extremity edema, dizziness, presyncope, or syncope.  The patient is otherwise without complaint today.   Past Medical History  Diagnosis Date  . HTN (hypertension)   . Atrial flutter     s/p CTI ablation 2010 by Dr Johney Frame  . Atrial fibrillation     paroxysmal, s/p afib ablation 3/13 by Dr Johney Frame  . GERD (gastroesophageal reflux disease)   . Pneumonia     hx of pneumonia  . H/O hiatal hernia    Past Surgical History  Procedure Date  . Cti ablation 7/10    for atrial flutter by Dr Johney Frame  . Hernia repair   . Cholecystectomy   . Tee without cardioversion 09/12/2011    Procedure: TRANSESOPHAGEAL ECHOCARDIOGRAM (TEE);  Surgeon: Corky Crafts, MD;  Location: [facility] ENDOSCOPY;  Service: Cardiovascular;  Laterality: N/A;  . Atrial fibrillation ablation 09/13/11    PVI by Dr Johney Frame    Current Outpatient Prescriptions  Medication Sig Dispense Refill  . diltiazem (CARDIZEM CD) 240 MG 24 hr capsule Take 1 capsule (240 mg total) by mouth daily.  30 capsule  11  . omeprazole (PRILOSEC) 20 MG capsule Take 20 mg by mouth daily.      . Rivaroxaban (XARELTO) 20 MG TABS Take 1 tablet by mouth daily.      Marland Kitchen DISCONTD: XARELTO 20 MG TABS TAKE ONE TABLET BY MOUTH ONE TIME DAILY  30 tablet  0    Physical Exam: Filed Vitals:   03/05/12 0936  BP: 128/78  Pulse: 63  Height: 5\' 11"  (1.803 m)  Weight: 233 lb (105.688 kg)    GEN- The patient is well appearing, alert and oriented x 3 today.   Head- normocephalic, atraumatic Eyes-  Sclera clear, conjunctiva pink Ears- hearing intact Oropharynx- clear Lungs- Clear to  ausculation bilaterally, normal work of breathing Heart- Regular rate and rhythm, no murmurs, rubs or gallops, PMI not laterally displaced GI- soft, NT, ND, + BS Extremities- no clubbing, cyanosis, or edema  ekg today reveals sinus rhythm 63 bpm, otherwise normal ekg  Assessment and Plan:

## 2012-03-05 NOTE — Patient Instructions (Addendum)
Your physician wants you to follow-up in: 6 months with Dr Allred You will receive a reminder letter in the mail two months in advance. If you don't receive a letter, please call our office to schedule the follow-up appointment.  Your physician has recommended you make the following change in your medication:  1) Stop Xarelto   

## 2012-03-19 ENCOUNTER — Telehealth: Payer: Self-pay | Admitting: Internal Medicine

## 2012-09-25 ENCOUNTER — Other Ambulatory Visit: Payer: Self-pay | Admitting: Cardiology

## 2012-09-25 MED ORDER — DILTIAZEM HCL ER COATED BEADS 240 MG PO CP24: 240.0000 mg | ORAL_CAPSULE | Freq: Every day | ORAL | Status: DC

## 2012-10-26 ENCOUNTER — Other Ambulatory Visit: Payer: Self-pay | Admitting: *Deleted

## 2012-10-26 MED ORDER — DILTIAZEM HCL ER COATED BEADS 240 MG PO CP24: 240.0000 mg | ORAL_CAPSULE | Freq: Every day | ORAL | Status: DC

## 2012-10-26 NOTE — Telephone Encounter (Signed)
Fax Received. Refill Completed. Carlos Thomas (R.M.A)   

## 2013-05-03 ENCOUNTER — Encounter: Payer: Self-pay | Admitting: Internal Medicine

## 2013-05-22 ENCOUNTER — Other Ambulatory Visit: Payer: Self-pay | Admitting: Internal Medicine

## 2013-05-24 ENCOUNTER — Other Ambulatory Visit: Payer: Self-pay | Admitting: *Deleted

## 2013-05-24 MED ORDER — DILTIAZEM HCL ER COATED BEADS 240 MG PO CP24: 240.0000 mg | ORAL_CAPSULE | Freq: Every day | ORAL | Status: DC

## 2013-06-11 ENCOUNTER — Ambulatory Visit (INDEPENDENT_AMBULATORY_CARE_PROVIDER_SITE_OTHER): Payer: BC Managed Care – PPO | Admitting: Cardiology

## 2013-06-11 ENCOUNTER — Encounter: Payer: Self-pay | Admitting: Cardiology

## 2013-06-11 VITALS — BP 122/80 | HR 57 | Ht 71.0 in | Wt 228.0 lb

## 2013-06-11 DIAGNOSIS — G4733 Obstructive sleep apnea (adult) (pediatric): Secondary | ICD-10-CM

## 2013-06-11 DIAGNOSIS — E669 Obesity, unspecified: Secondary | ICD-10-CM

## 2013-06-11 DIAGNOSIS — I4891 Unspecified atrial fibrillation: Secondary | ICD-10-CM

## 2013-06-11 DIAGNOSIS — I4892 Unspecified atrial flutter: Secondary | ICD-10-CM

## 2013-06-11 DIAGNOSIS — I10 Essential (primary) hypertension: Secondary | ICD-10-CM

## 2013-06-11 NOTE — Progress Notes (Signed)
[address] [place], [place]  [address] Phone: [phone] Fax:  [phone]  Date:  06/11/2013   ID:  Carlos Thomas, DOB February 20, 1951, MRN [id number]  PCP:  Johny Blamer, MD   History of Present Illness: BRISTON LAX is a 62 y.o. male with paroxysmal atrial fibrillation last seen by Dr. Johney Frame on 03/05/12 status post ablation in 2013.  Minimal palpitations.  Teacher, finals week. Mild OSH. Recently changed classroom space. Had an EtOH beverage at [employer] and felt BP increase. Went home, took another cardizem. Cleared. Monday morning opened door to building and felt some palpitations. Went home and has been fine ever since.   Recently finished divorce. Fostering puppies.     Teacher in criminal justice at [employer]  Wt Readings from Last 3 Encounters:  06/11/13 228 lb (103.42 kg)  03/05/12 233 lb (105.688 kg)  12/11/11 232 lb 6.4 oz (105.416 kg)     Past Medical History  Diagnosis Date  . HTN (hypertension)   . Atrial flutter    s/p CTI ablation 2010 by Dr Johney Frame  . Atrial fibrillation     paroxysmal, s/p afib ablation 3/13 by Dr Johney Frame  . GERD (gastroesophageal reflux disease)   . Pneumonia     hx of pneumonia  . H/O hiatal hernia     Past Surgical History  Procedure Laterality Date  . Cti ablation  7/10    for atrial flutter by Dr Johney Frame  . Hernia repair    . Cholecystectomy    . Tee without cardioversion  09/12/2011    Procedure: TRANSESOPHAGEAL ECHOCARDIOGRAM (TEE);  Surgeon: Corky Crafts, MD;  Location: [facility] ENDOSCOPY;  Service: Cardiovascular;  Laterality: N/A;  . Atrial fibrillation ablation  09/13/11    PVI by Dr Johney Frame    Current Outpatient Prescriptions  Medication Sig Dispense Refill  . diltiazem (CARDIZEM CD) 240 MG 24 hr capsule Take 1 capsule (240 mg total) by mouth daily.  30 capsule  0  . omeprazole (PRILOSEC) 20 MG capsule Take 20 mg by mouth daily.       No current facility-administered medications for this visit.    Allergies:   No Known Allergies  Social History:  The patient  reports that he has never smoked. He does not have any smokeless tobacco history on file. He reports that he drinks about 0.6 ounces of alcohol per week. He reports that he does not use illicit drugs.   ROS:  Please see the history of present illness.   No syncope,  No CP, no SOB.   PHYSICAL EXAM: VS:  BP 122/80  Pulse 57  Ht 5\' 11"  (1.803 m)  Wt 228 lb (103.42 kg)  BMI 31.81 kg/m2 Well nourished, well developed, in no acute distress HEENT: normal Neck: no JVD Cardiac:  normal S1, S2; RRR; no murmur Lungs:  clear to auscultation bilaterally, no wheezing, rhonchi or rales Abd: soft, nontender, no hepatomegaly Ext: no edema Skin: warm and dry Neuro: no focal abnormalities noted  EKG:  Sinus bradycardia rate 57, vertical axis     ASSESSMENT AND PLAN:  1. Atrial fibrillation-paroxysmal-following ablation. Doing very well. Brief episode of palpitations. No prolonged episodes. Continue with  diltiazem. 2. Structures sleep apnea-continue with treatment. Dr. Mayford Knife. Expressed to him how treatment of sleep apnea is important in prevention of arrhythmias. 3. Obesity-continue with weight loss, exercise.  Signed, Donato Schultz, MD [facility]  06/11/2013 2:42 PM

## 2013-06-11 NOTE — Patient Instructions (Signed)
Your physician recommends that you continue on your current medications as directed. Please refer to the Current Medication list given to you today.  Your physician wants you to follow-up in: 1 year with Dr. Skians You will receive a reminder letter in the mail two months in advance. If you don't receive a letter, please call our office to schedule the follow-up appointment.  

## 2013-06-18 ENCOUNTER — Other Ambulatory Visit: Payer: Self-pay | Admitting: Internal Medicine

## 2013-07-19 ENCOUNTER — Other Ambulatory Visit: Payer: Self-pay | Admitting: Internal Medicine

## 2013-07-20 ENCOUNTER — Other Ambulatory Visit: Payer: Self-pay | Admitting: Cardiology

## 2013-07-20 MED ORDER — DILTIAZEM HCL ER COATED BEADS 240 MG PO CP24: 240.0000 mg | ORAL_CAPSULE | Freq: Every day | ORAL | Status: DC

## 2013-09-10 ENCOUNTER — Ambulatory Visit: Payer: BC Managed Care – PPO | Admitting: Cardiology

## 2013-09-24 ENCOUNTER — Ambulatory Visit: Payer: BC Managed Care – PPO | Admitting: Cardiology

## 2013-10-12 ENCOUNTER — Encounter: Payer: Self-pay | Admitting: Cardiology

## 2013-10-12 ENCOUNTER — Encounter: Payer: Self-pay | Admitting: General Surgery

## 2013-10-14 ENCOUNTER — Encounter: Payer: Self-pay | Admitting: Cardiology

## 2013-10-14 ENCOUNTER — Ambulatory Visit (INDEPENDENT_AMBULATORY_CARE_PROVIDER_SITE_OTHER): Payer: BC Managed Care – PPO | Admitting: Cardiology

## 2013-10-14 VITALS — BP 130/72 | HR 62 | Ht 71.0 in | Wt 232.0 lb

## 2013-10-14 DIAGNOSIS — E669 Obesity, unspecified: Secondary | ICD-10-CM | POA: Insufficient documentation

## 2013-10-14 DIAGNOSIS — G4733 Obstructive sleep apnea (adult) (pediatric): Secondary | ICD-10-CM

## 2013-10-14 DIAGNOSIS — I10 Essential (primary) hypertension: Secondary | ICD-10-CM

## 2013-10-14 NOTE — Progress Notes (Signed)
[address] [place], [place]  [address] Phone: [phone] Fax:  [phone]  Date:  10/14/2013   ID:  Carlos Thomas, DOB 12-Sep-1950, MRN [id number]  PCP:  Shirline Frees, MD  Sleep Medicine:  Fransico Him, MD     History of Present Illness: Carlos Thomas is a 63 y.o. male with a history of OSA and obesity who presents today for followup.  He is doing well with his CPAP device.  He tolerates his CPAP without any problems.  He tolerates the full face mask and feels the pressure is adequate.  He feels rested in the am and has no daytime sleepiness.  He does not think he snores at night.     Wt Readings from Last 3 Encounters:  10/14/13 232 lb (105.235 kg)  06/11/13 228 lb (103.42 kg)  03/05/12 233 lb (105.688 kg)     Past Medical History  Diagnosis Date  . HTN (hypertension)   . Atrial flutter     s/p CTI ablation 2010 by Dr Rayann Heman  . Atrial fibrillation     paroxysmal, s/p afib ablation 3/13 by Dr Rayann Heman  . GERD (gastroesophageal reflux disease)   . Pneumonia     hx of pneumonia  . H/O hiatal hernia   . Psychosocial stressors     chronic  . MRSA (methicillin resistant Staphylococcus aureus)     H/O flare ups  . Adenomatous polyps     tubular on colonoscopy 2/11  . OSA (obstructive sleep apnea)     Mild w AHI 6.57/hr now on 13cm H2O  . Obesity (BMI 30-39.9)     Current Outpatient Prescriptions  Medication Sig Dispense Refill  . diltiazem (CARTIA XT) 240 MG 24 hr capsule Take 1 capsule (240 mg total) by mouth daily.  30 capsule  11  . omeprazole (PRILOSEC) 20 MG capsule Take 20 mg by mouth daily.       No current facility-administered medications for this visit.    Allergies:   No Known Allergies  Social History:  The patient  reports that he has never smoked. He does not have any smokeless tobacco history on file. He reports that he drinks about .6 ounces of alcohol per week. He reports that he does not use illicit drugs.   Family History:   The patient's family history includes CAD in his father; Heart attack in his father. There is no history of Atrial fibrillation or Anesthesia problems.   ROS:  Please see the history of present illness.      All other systems reviewed and negative.   PHYSICAL EXAM: VS:  BP 130/72  Pulse 62  Ht 5\' 11"  (1.803 m)  Wt 232 lb (105.235 kg)  BMI 32.37 kg/m2 Well nourished, well developed, in no acute distress HEENT: normal Neck: no JVD Cardiac:  normal S1, S2; RRR; no murmur Lungs:  clear to auscultation bilaterally, no wheezing, rhonchi or rales Abd: soft, nontender, no hepatomegaly Ext: no edema Skin: warm and dry Neuro:  CNs 2-12 intact, no focal abnormalities noted       ASSESSMENT AND PLAN:  1. OSA on CPAP and tolerating well.  His download today showed an AHI of 0.7 /hr on 7cm H2O and 93% compliance in using more than 4 hours nightly.   2. Obesity - he does not get much aerobic exercise due to his schedule.  I have encouarged him to try to increase his aerobic exercise. 3. HTN well controlled - continue  diltiazem  Followup with me in 6 months  Signed, Fransico Him, MD 10/14/2013 9:14 AM

## 2013-10-14 NOTE — Patient Instructions (Signed)
Your physician wants you to follow-up in: 6 months with Dr. Turner. You will receive a reminder letter in the mail two months in advance. If you don't receive a letter, please call our office to schedule the follow-up appointment.  Your physician recommends that you continue on your current medications as directed. Please refer to the Current Medication list given to you today.  

## 2013-11-29 ENCOUNTER — Telehealth: Payer: Self-pay | Admitting: Cardiology

## 2014-01-13 NOTE — Telephone Encounter (Signed)
Close encounter 

## 2014-02-22 ENCOUNTER — Encounter: Payer: Self-pay | Admitting: Cardiology

## 2014-03-03 NOTE — Telephone Encounter (Signed)
error 

## 2014-05-04 ENCOUNTER — Ambulatory Visit: Payer: BC Managed Care – PPO | Admitting: Cardiology

## 2014-05-13 ENCOUNTER — Encounter: Payer: Self-pay | Admitting: Cardiology

## 2014-05-13 ENCOUNTER — Ambulatory Visit (INDEPENDENT_AMBULATORY_CARE_PROVIDER_SITE_OTHER): Payer: BC Managed Care – PPO | Admitting: Cardiology

## 2014-05-13 VITALS — BP 140/58 | HR 60 | Ht 71.0 in | Wt 228.0 lb

## 2014-05-13 DIAGNOSIS — E669 Obesity, unspecified: Secondary | ICD-10-CM

## 2014-05-13 DIAGNOSIS — G4733 Obstructive sleep apnea (adult) (pediatric): Secondary | ICD-10-CM

## 2014-05-13 DIAGNOSIS — I10 Essential (primary) hypertension: Secondary | ICD-10-CM

## 2014-05-13 NOTE — Progress Notes (Signed)
[place], [place] [place], [place]  [address] Phone: [phone] Fax:  [phone]  Date:  05/13/2014   ID:  Carlos Thomas, DOB 02/02/1951, MRN [id number]  PCP:  Shirline Frees, MD  Cardiologist:  Fransico Him, MD    History of Present Illness: Carlos Thomas is a 62 y.o. male with a history of OSA and obesity who presents today for followup. He is doing well with his CPAP device. He tolerates his CPAP without any problems. He tolerates the full face mask and feels the pressure is adequate. He denies any nasal congestion or dryness.  He feels rested in the am and has no daytime sleepiness. He does not think he snores at night.    Wt Readings from Last 3 Encounters:  05/13/14 228 lb (103.42 kg)  10/14/13 232 lb (105.235 kg)  06/11/13 228 lb (103.42 kg)     Past Medical History  Diagnosis Date  . HTN (hypertension)   . Atrial flutter     s/p CTI ablation 2010 by Dr Rayann Heman  . Atrial fibrillation     paroxysmal, s/p afib ablation 3/13 by Dr Rayann Heman  . GERD (gastroesophageal reflux disease)   . Pneumonia     hx of pneumonia  . H/O hiatal hernia   . Psychosocial stressors     chronic  . MRSA (methicillin resistant Staphylococcus aureus)     H/O flare ups  . Adenomatous polyps     tubular on colonoscopy 2/11  . OSA (obstructive sleep apnea)     Mild w AHI 6.57/hr now on 13cm H2O  . Obesity (BMI 30-39.9)     Current Outpatient Prescriptions  Medication Sig Dispense Refill  . diltiazem (CARTIA XT) 240 MG 24 hr capsule Take 1 capsule (240 mg total) by mouth daily. 30 capsule 11  . meloxicam (MOBIC) 15 MG tablet   5  . omeprazole (PRILOSEC) 20 MG capsule Take 20 mg by mouth daily.    [facility] ophthalmic ointment   1   No current facility-administered medications for this visit.    Allergies:   No Known Allergies  Social History:  The patient  reports that he has never smoked. He does not have any smokeless tobacco history on file. He reports that he  drinks about 0.6 oz of alcohol per week. He reports that he does not use illicit drugs.   Family History:  The patient's family history includes CAD in his father; Heart attack in his father. There is no history of Atrial fibrillation or Anesthesia problems.   ROS:  Please see the history of present illness.      All other systems reviewed and negative.   PHYSICAL EXAM: VS:  BP 140/58 mmHg  Pulse 60  Ht 5\' 11"  (1.803 m)  Wt 228 lb (103.42 kg)  BMI 31.81 kg/m2  SpO2 96% Well nourished, well developed, in no acute distress HEENT: normal Neck: no JVD Cardiac:  normal S1, S2; RRR; no murmur Lungs:  clear to auscultation bilaterally, no wheezing, rhonchi or rales Abd: soft, nontender, no hepatomegaly Ext: no edema Skin: warm and dry Neuro:  CNs 2-12 intact, no focal abnormalities noted  ASSESSMENT AND PLAN:  1. OSA on CPAP and tolerating well. His download today showed an AHI of 0.3 /hr on 7cm H2O and 98% compliance in using more than 4 hours nightly.  2. Obesity - he has lost 4 lbs since last OV.  He has been trying to follow a better diet  but has not been exercising due to his work.  I have encouraged him to try to exercise when time allows 3. HTN well controlled - continue diltiazem  Followup with me in 6 months   Signed, Fransico Him, MD [facility] 05/13/2014 10:10 AM

## 2014-05-13 NOTE — Patient Instructions (Signed)
Your physician wants you to follow-up in: 6 months with Dr. Turner. You will receive a reminder letter in the mail two months in advance. If you don't receive a letter, please call our office to schedule the follow-up appointment.  Your physician recommends that you continue on your current medications as directed. Please refer to the Current Medication list given to you today.  

## 2014-05-24 ENCOUNTER — Encounter (HOSPITAL_BASED_OUTPATIENT_CLINIC_OR_DEPARTMENT_OTHER): Payer: Self-pay

## 2014-05-24 ENCOUNTER — Emergency Department (HOSPITAL_BASED_OUTPATIENT_CLINIC_OR_DEPARTMENT_OTHER)
Admission: EM | Admit: 2014-05-24 | Discharge: 2014-05-24 | Disposition: A | Discharge status: Home / Self Care | Payer: BC Managed Care – PPO | Attending: Emergency Medicine | Admitting: Emergency Medicine

## 2014-05-24 ENCOUNTER — Emergency Department (HOSPITAL_COMMUNITY)
Admission: EM | Admit: 2014-05-24 | Discharge: 2014-05-24 | Disposition: A | Discharge status: Home / Self Care | Payer: BC Managed Care – PPO | Source: Home / Self Care | Attending: Emergency Medicine | Admitting: Emergency Medicine

## 2014-05-24 ENCOUNTER — Encounter (HOSPITAL_COMMUNITY): Payer: Self-pay | Admitting: Emergency Medicine

## 2014-05-24 DIAGNOSIS — Z8701 Personal history of pneumonia (recurrent): Secondary | ICD-10-CM | POA: Insufficient documentation

## 2014-05-24 DIAGNOSIS — R001 Bradycardia, unspecified: Secondary | ICD-10-CM | POA: Insufficient documentation

## 2014-05-24 DIAGNOSIS — R002 Palpitations: Secondary | ICD-10-CM | POA: Diagnosis present

## 2014-05-24 DIAGNOSIS — Z79899 Other long term (current) drug therapy: Secondary | ICD-10-CM

## 2014-05-24 DIAGNOSIS — Z8614 Personal history of Methicillin resistant Staphylococcus aureus infection: Secondary | ICD-10-CM | POA: Insufficient documentation

## 2014-05-24 DIAGNOSIS — E669 Obesity, unspecified: Secondary | ICD-10-CM | POA: Insufficient documentation

## 2014-05-24 DIAGNOSIS — Z86018 Personal history of other benign neoplasm: Secondary | ICD-10-CM

## 2014-05-24 DIAGNOSIS — Z86010 Personal history of colonic polyps: Secondary | ICD-10-CM | POA: Insufficient documentation

## 2014-05-24 DIAGNOSIS — K219 Gastro-esophageal reflux disease without esophagitis: Secondary | ICD-10-CM | POA: Insufficient documentation

## 2014-05-24 DIAGNOSIS — I4891 Unspecified atrial fibrillation: Secondary | ICD-10-CM | POA: Diagnosis not present

## 2014-05-24 DIAGNOSIS — I10 Essential (primary) hypertension: Secondary | ICD-10-CM

## 2014-05-24 DIAGNOSIS — Z9981 Dependence on supplemental oxygen: Secondary | ICD-10-CM

## 2014-05-24 DIAGNOSIS — Z8669 Personal history of other diseases of the nervous system and sense organs: Secondary | ICD-10-CM | POA: Insufficient documentation

## 2014-05-24 DIAGNOSIS — G4733 Obstructive sleep apnea (adult) (pediatric): Secondary | ICD-10-CM

## 2014-05-24 LAB — CBC WITH DIFFERENTIAL/PLATELET
Basophils Absolute: 0 10*3/uL (ref 0.0–0.1)
Basophils Absolute: 0 K/uL (ref 0.0–0.1)
Basophils Relative: 0 % (ref 0–1)
Basophils Relative: 0 % (ref 0–1)
Eosinophils Absolute: 0.4 10*3/uL (ref 0.0–0.7)
Eosinophils Absolute: 0.2 K/uL (ref 0.0–0.7)
Eosinophils Relative: 5 % (ref 0–5)
Eosinophils Relative: 2 % (ref 0–5)
HCT: 45.5 % (ref 39.0–52.0)
HEMATOCRIT: 50 % (ref 39.0–52.0)
Hemoglobin: 16.8 g/dL (ref 13.0–17.0)
Hemoglobin: 15.4 g/dL (ref 13.0–17.0)
LYMPHS ABS: 1.8 10*3/uL (ref 0.7–4.0)
LYMPHS PCT: 21 % (ref 12–46)
Lymphocytes Relative: 9 % — ABNORMAL LOW (ref 12–46)
Lymphs Abs: 0.9 K/uL (ref 0.7–4.0)
MCH: 32.5 pg (ref 26.0–34.0)
MCH: 33 pg (ref 26.0–34.0)
MCHC: 33.6 g/dL (ref 30.0–36.0)
MCHC: 33.8 g/dL (ref 30.0–36.0)
MCV: 96.7 fL (ref 78.0–100.0)
MCV: 97.6 fL (ref 78.0–100.0)
MONO ABS: 0.8 10*3/uL (ref 0.1–1.0)
MONOS PCT: 10 % (ref 3–12)
Monocytes Absolute: 0.6 K/uL (ref 0.1–1.0)
Monocytes Relative: 6 % (ref 3–12)
NEUTROS ABS: 5.2 10*3/uL (ref 1.7–7.7)
Neutro Abs: 8.7 K/uL — ABNORMAL HIGH (ref 1.7–7.7)
Neutrophils Relative %: 64 % (ref 43–77)
Neutrophils Relative %: 83 % — ABNORMAL HIGH (ref 43–77)
Platelets: 135 10*3/uL — ABNORMAL LOW (ref 150–400)
Platelets: 139 K/uL — ABNORMAL LOW (ref 150–400)
RBC: 5.17 MIL/uL (ref 4.22–5.81)
RBC: 4.66 MIL/uL (ref 4.22–5.81)
RDW: 13.6 % (ref 11.5–15.5)
RDW: 13.7 % (ref 11.5–15.5)
WBC: 8.3 10*3/uL (ref 4.0–10.5)
WBC: 10.4 K/uL (ref 4.0–10.5)

## 2014-05-24 LAB — BASIC METABOLIC PANEL
Anion gap: 13 (ref 5–15)
Anion gap: 13 (ref 5–15)
BUN: 12 mg/dL (ref 6–23)
BUN: 13 mg/dL (ref 6–23)
CHLORIDE: 106 meq/L (ref 96–112)
CO2: 25 mEq/L (ref 19–32)
CO2: 22 mEq/L (ref 19–32)
CREATININE: 0.9 mg/dL (ref 0.50–1.35)
Calcium: 9 mg/dL (ref 8.4–10.5)
Calcium: 8.5 mg/dL (ref 8.4–10.5)
Chloride: 108 mEq/L (ref 96–112)
Creatinine, Ser: 1.04 mg/dL (ref 0.50–1.35)
GFR calc Af Amer: >90 mL/min (ref >90)
GFR calc Af Amer: 85 mL/min — ABNORMAL LOW (ref >90)
GFR calc non Af Amer: 87 mL/min — ABNORMAL LOW (ref >90)
GFR calc non Af Amer: 73 mL/min — ABNORMAL LOW (ref >90)
GLUCOSE: 127 mg/dL — AB (ref 70–99)
Glucose, Bld: 152 mg/dL — ABNORMAL HIGH (ref 70–99)
Potassium: 4.4 mEq/L (ref 3.7–5.3)
Potassium: 4.8 mEq/L (ref 3.7–5.3)
Sodium: 144 mEq/L (ref 137–147)
Sodium: 143 mEq/L (ref 137–147)

## 2014-05-24 LAB — MAGNESIUM: Magnesium: 2 mg/dL (ref 1.5–2.5)

## 2014-05-24 LAB — TROPONIN I
Troponin I: <0.3 ng/mL (ref <0.30)
Troponin I: <0.3 ng/mL (ref <0.30)

## 2014-05-24 MED ORDER — FLECAINIDE ACETATE 100 MG PO TABS
300.0000 mg | ORAL_TABLET | Freq: Once | ORAL | Status: AC
  Administered 2014-05-24: 300 mg via ORAL

## 2014-05-24 MED ORDER — BENZONATATE 100 MG PO CAPS: 100.0000 mg | ORAL_CAPSULE | Freq: Three times a day (TID) | ORAL | Status: DC | PRN

## 2014-05-24 MED ORDER — SODIUM CHLORIDE 0.9 % IV BOLUS (SEPSIS)
1000.0000 mL | Freq: Once | INTRAVENOUS | Status: AC
  Administered 2014-05-24: 1000 mL via INTRAVENOUS

## 2014-05-24 MED ORDER — DILTIAZEM HCL 25 MG/5ML IV SOLN
INTRAVENOUS | Status: AC
  Administered 2014-05-24: 25 mg
  Filled 2014-05-24: qty 5

## 2014-05-24 MED ORDER — DILTIAZEM HCL 100 MG IV SOLR: 5.0000 mg/h | INTRAVENOUS | Status: DC

## 2014-05-24 MED ORDER — DILTIAZEM LOAD VIA INFUSION
20.0000 mg | Freq: Once | INTRAVENOUS | Status: AC
  Administered 2014-05-24: 20 mg via INTRAVENOUS
  Filled 2014-05-24: qty 20

## 2014-05-24 MED ORDER — DILTIAZEM HCL 25 MG/5ML IV SOLN
INTRAVENOUS | Status: AC
  Administered 2014-05-24: 100 mg
  Filled 2014-05-24: qty 20

## 2014-05-24 MED ORDER — FLECAINIDE ACETATE 50 MG PO TABS
ORAL_TABLET | ORAL | Status: AC
  Filled 2014-05-24: qty 6

## 2014-05-24 MED ORDER — SODIUM CHLORIDE 0.9 % IV BOLUS (SEPSIS)
1000.0000 mL | Freq: Once | INTRAVENOUS | Status: AC
Start: 2014-05-24 — End: 2014-05-24
  Administered 2014-05-24: 1000 mL via INTRAVENOUS

## 2014-05-24 NOTE — ED Notes (Signed)
Pt reports he feels he is having "irregular rapid heart beat".  Pt reports it started last night. Feels like the usual afib.

## 2014-05-24 NOTE — ED Notes (Signed)
Reports this started because of his coughing fits.  Reports tried to take an extra dose of cardiazem but has not had success in the palpitations stopping.

## 2014-05-24 NOTE — ED Provider Notes (Addendum)
CSN: [id number]     Arrival date & time 05/24/14  0150 History   First MD Initiated Contact with Patient 05/24/14 0158     Chief Complaint  Patient presents with  . Palpitations     (Consider location/radiation/quality/duration/timing/severity/associated sxs/prior Treatment) HPI  This is a 63 year old male with a history of paroxysmal atrial fibrillation. He has had bronchitis symptoms for the last several days. He had an episode of coughing yesterday evening about 9 PM which he states triggered conversion to atrial fibrillation. He is here with palpitations by which he means rapid heart rate. His rate has been noted to be as high as 179 beats per minute. He denies chest pain or shortness of breath apart from the symptoms of bronchitis (mild chest tightness, cough) ER he already had. He tried taking extra dose of his diltiazem at home without relief. He has not had vomiting or diaphoresis.  Past Medical History  Diagnosis Date  . HTN (hypertension)   . Atrial flutter     s/p CTI ablation 2010 by Dr Rayann Heman  . Atrial fibrillation     paroxysmal, s/p afib ablation 3/13 by Dr Rayann Heman  . GERD (gastroesophageal reflux disease)   . Pneumonia     hx of pneumonia  . H/O hiatal hernia   . Psychosocial stressors     chronic  . MRSA (methicillin resistant Staphylococcus aureus)     H/O flare ups  . Adenomatous polyps     tubular on colonoscopy 2/11  . OSA (obstructive sleep apnea)     Mild w AHI 6.57/hr now on 13cm H2O  . Obesity (BMI 30-39.9)    Past Surgical History  Procedure Laterality Date  . Cti ablation  7/10    for atrial flutter by Dr Rayann Heman  . Hernia repair    . Cholecystectomy    . Tee without cardioversion  09/12/2011    Procedure: TRANSESOPHAGEAL ECHOCARDIOGRAM (TEE);  Surgeon: Jettie Booze, MD;  Location: [facility] ENDOSCOPY;  Service: Cardiovascular;  Laterality: N/A;  . Atrial fibrillation ablation  09/13/11    PVI by Dr Rayann Heman  . Colonoscopy  08/2009   Family History   Problem Relation Age of Onset  . Atrial fibrillation Neg Hx   . Anesthesia problems Neg Hx   . CAD Father   . Heart attack Father    History  Substance Use Topics  . Smoking status: Never Smoker   . Smokeless tobacco: Not on file     Comment: denies any tobacco use   . Alcohol Use: 0.6 oz/week    1 Cans of beer per week     Comment: lass than 1 alcoholic beverage/week     Review of Systems  All other systems reviewed and are negative.   Allergies  Review of patient's allergies indicates no known allergies.  Home Medications   Prior to Admission medications   Medication Sig Start Date End Date Taking? Authorizing Provider  diltiazem (CARTIA XT) 240 MG 24 hr capsule Take 1 capsule (240 mg total) by mouth daily. 07/20/13   Candee Furbish, MD  meloxicam [facility]) 15 MG tablet  04/28/14   Historical Provider, MD  omeprazole (PRILOSEC) 20 MG capsule Take 20 mg by mouth daily.    Historical Provider, MD  [facility] ophthalmic ointment  02/08/14   Historical Provider, MD   BP 118/61 mmHg  Pulse 62  Temp(Src) 98 F (36.7 C) (Oral)  Resp 18  Ht 5\' 11"  (1.803 m)  Wt 226 lb (102.513 kg)  BMI 31.53 kg/m2  SpO2 96%   Physical Exam  General: Well-developed, well-nourished male in no acute distress; appearance consistent with age of record HENT: normocephalic; atraumatic Eyes: pupils equal, round and reactive to light; extraocular muscles intact Neck: supple Heart: Irregular rhythm; tachycardia Lungs: clear to auscultation bilaterally Abdomen: soft; nondistended; nontender; no masses or hepatosplenomegaly; bowel sounds present Extremities: No deformity; full range of motion; pulses normal Neurologic: Awake, alert and oriented; motor function intact in all extremities and symmetric; no facial droop Skin: Warm and dry Psychiatric: Normal mood and affect    ED Course  Procedures (including critical care time)  CHEMICAL CARDIOVERSION Patient was given 300 milligrams of flecainide by  mouth with subsequent conversion to sinus bradycardia. He was monitored continuously during his ED stay. There were no immediate complications and the patient tolerated this well.  CRITICAL CARE Performed by: Shanon Rosser L Total critical care time: 45 minutes Critical care time was exclusive of separately billable procedures and treating other patients. Critical care was necessary to treat or prevent imminent or life-threatening deterioration. Critical care was time spent personally by me on the following activities: development of treatment plan with patient and/or surrogate as well as nursing, discussions with consultants, evaluation of patient's response to treatment, examination of patient, obtaining history from patient or surrogate, ordering and performing treatments and interventions, ordering and review of laboratory studies, ordering and review of radiographic studies, pulse oximetry and re-evaluation of patient's condition.   MDM   Initial EKG:  EKG Interpretation  Date/Time:  Tuesday May 24 2014 01:55:58 EST Ventricular Rate:  179 PR Interval:    QRS Duration: 84 QT Interval:  262 QTC Calculation: 452 R Axis:   111 Text Interpretation:  Atrial fibrillation with rapid ventricular response Left posterior fascicular block Nonspecific ST abnormality Abnormal QRS-T angle, consider primary T wave abnormality Abnormal ECG Previously NSR Confirmed by Florina Ou  MD, Jenny Reichmann ([id number]) on 05/24/2014 2:04:23 AM     Post Cardizem Bolus:  EKG Interpretation  Date/Time:  Tuesday May 24 2014 02:16:46 EST Ventricular Rate:  109 PR Interval:    QRS Duration: 86 QT Interval:  304 QTC Calculation: 409 R Axis:   102 Text Interpretation:  Atrial fibrillation with rapid ventricular response with premature ventricular or aberrantly conducted complexes Rightward axis Abnormal ECG Rate is slower Confirmed by Ameliana Brashear  MD, Jenny Reichmann ([id number]) on 05/24/2014 5:55:25 AM     Post-Cardioversion: EKG  Interpretation:  Date & Time: 05/24/2014 5:46 AM  Rate: 52  Rhythm: sinus bradycardia  QRS Axis: right  Intervals: normal  ST/T Wave abnormalities: normal  Conduction Disutrbances:none  Narrative Interpretation:   Old EKG Reviewed: Previously atrial fibrillation with rapid ventricular response   Nursing notes and vitals signs, including pulse oximetry, reviewed.  Summary of this visit's results, reviewed by myself:  Labs:  Results for orders placed or performed during the hospital encounter of 05/24/14 (from the past 24 hour(s))  CBC with Differential     Status: Abnormal   Collection Time: 05/24/14  2:00 AM  Result Value Ref Range   WBC 8.3 4.0 - 10.5 K/uL   RBC 5.17 4.22 - 5.81 MIL/uL   Hemoglobin 16.8 13.0 - 17.0 g/dL   HCT 50.0 39.0 - 52.0 %   MCV 96.7 78.0 - 100.0 fL   MCH 32.5 26.0 - 34.0 pg   MCHC 33.6 30.0 - 36.0 g/dL   RDW 13.6 11.5 - 15.5 %   Platelets 135 (L) 150 - 400 K/uL   Neutrophils Relative %  64 43 - 77 %   Neutro Abs 5.2 1.7 - 7.7 K/uL   Lymphocytes Relative 21 12 - 46 %   Lymphs Abs 1.8 0.7 - 4.0 K/uL   Monocytes Relative 10 3 - 12 %   Monocytes Absolute 0.8 0.1 - 1.0 K/uL   Eosinophils Relative 5 0 - 5 %   Eosinophils Absolute 0.4 0.0 - 0.7 K/uL   Basophils Relative 0 0 - 1 %   Basophils Absolute 0.0 0.0 - 0.1 K/uL  Basic metabolic panel     Status: Abnormal   Collection Time: 05/24/14  2:20 AM  Result Value Ref Range   Sodium 144 137 - 147 mEq/L   Potassium 4.4 3.7 - 5.3 mEq/L   Chloride 106 96 - 112 mEq/L   CO2 25 19 - 32 mEq/L   Glucose, Bld 127 (H) 70 - 99 mg/dL   BUN 12 6 - 23 mg/dL   Creatinine, Ser 0.90 0.50 - 1.35 mg/dL   Calcium 9.0 8.4 - 10.5 mg/dL   GFR calc non Af Amer 87 (L) >90 mL/min   GFR calc Af Amer >90 >90 mL/min   Anion gap 13 5 - 15  Troponin I     Status: None   Collection Time: 05/24/14  2:20 AM  Result Value Ref Range   Troponin I <0.30 <0.30 ng/mL    2:18 AM Cardizem bolus 20 milligrams IV and infusion  initiated.  4:37 AM Rate well-controlled on Cardizem drip but has not converted back to normal sinus rhythm.  4:53 AM Discussed with Dr. Benjamine Mola of cardiology. He recommends 300 milligrams of oral flecainide.  5:09 AM Spontaneously converted to sinus bradycardia. Cardizem drip discontinued. Rate is steady at about 50 beats per minute.  6:11 AM Ambulates without difficulty or lightheadedness; heart rate went up to 66. Patient continues to be in asymptomatic sinus bradycardia at rest. He will contact his electrophysiologist, Dr. Rayann Heman, later this morning.       Wynetta Fines, MD 05/24/14 [place], MD 05/24/14 [phone]

## 2014-05-24 NOTE — Discharge Instructions (Signed)

## 2014-05-24 NOTE — ED Notes (Signed)
63 yo male went to [facility] 11/24 around 0100 with c/o dizziness. Pt was evaluated and seen in A.FIB W/ RVR. Pt given Cardizem and Flicanide and discharged. Pt then prior to breakfast took nyquil 30 mg and then went to breakfast where EMS was called out for dizziness. A/O X4 Denies pain.  Vitals  101/68 HR 48-50 SB with PAC 18 resp 95% CBG 150

## 2014-05-24 NOTE — ED Provider Notes (Signed)
CSN: [id number]     Arrival date & time 05/24/14  0902 History   First MD Initiated Contact with Patient 05/24/14 [phone]     Chief Complaint  Patient presents with  . Dizziness     (Consider location/radiation/quality/duration/timing/severity/associated sxs/prior Treatment) HPI  63 year old male with dizziness. Patient was just seen at [place] at [facility] earlier this morning after he presented with  A. fib with RVR. He took usual dose of cardizem yesterday then additional dose with his symptoms. Then received 20mg  cardizem IV and then started on gtt. Given 300mg  flecainide and converted to sinus rhythm very shortly after. He is feeling better prior to discharge. This morning he was going to get breakfast when he began to feel dizzy as if he may pass out. The symptoms have persisted since then. No acute pain. No shortness of breath.  Past Medical History  Diagnosis Date  . HTN (hypertension)   . Atrial flutter     s/p CTI ablation 2010 by Dr Rayann Heman  . Atrial fibrillation     paroxysmal, s/p afib ablation 3/13 by Dr Rayann Heman  . GERD (gastroesophageal reflux disease)   . Pneumonia     hx of pneumonia  . H/O hiatal hernia   . Psychosocial stressors     chronic  . MRSA (methicillin resistant Staphylococcus aureus)     H/O flare ups  . Adenomatous polyps     tubular on colonoscopy 2/11  . OSA (obstructive sleep apnea)     Mild w AHI 6.57/hr now on 13cm H2O  . Obesity (BMI 30-39.9)    Past Surgical History  Procedure Laterality Date  . Cti ablation  7/10    for atrial flutter by Dr Rayann Heman  . Hernia repair    . Cholecystectomy    . Tee without cardioversion  09/12/2011    Procedure: TRANSESOPHAGEAL ECHOCARDIOGRAM (TEE);  Surgeon: Jettie Booze, MD;  Location: [facility] ENDOSCOPY;  Service: Cardiovascular;  Laterality: N/A;  . Atrial fibrillation ablation  09/13/11    PVI by Dr Rayann Heman  . Colonoscopy  08/2009   Family History  Problem Relation Age of Onset  . Atrial  fibrillation Neg Hx   . Anesthesia problems Neg Hx   . CAD Father   . Heart attack Father    History  Substance Use Topics  . Smoking status: Never Smoker   . Smokeless tobacco: Not on file     Comment: denies any tobacco use   . Alcohol Use: 0.6 oz/week    1 Cans of beer per week     Comment: lass than 1 alcoholic beverage/week     Review of Systems  All systems reviewed and negative, other than as noted in HPI.   Allergies  Review of patient's allergies indicates no known allergies.  Home Medications   Prior to Admission medications   Medication Sig Start Date End Date Taking? Authorizing Provider  diltiazem (CARTIA XT) 240 MG 24 hr capsule Take 1 capsule (240 mg total) by mouth daily. 07/20/13   Candee Furbish, MD  meloxicam [facility]) 15 MG tablet  04/28/14   Historical Provider, MD  omeprazole (PRILOSEC) 20 MG capsule Take 20 mg by mouth daily.    Historical Provider, MD  [facility] ophthalmic ointment  02/08/14   Historical Provider, MD   BP 106/59 mmHg  Pulse 50  Temp(Src) 97.9 F (36.6 C) (Oral)  Resp 14  SpO2 98% Physical Exam  Constitutional: He appears well-developed and well-nourished. No distress.  HENT:  Head: Normocephalic and atraumatic.  Eyes: Conjunctivae are normal. Right eye exhibits no discharge. Left eye exhibits no discharge.  Neck: Neck supple.  Cardiovascular: Regular rhythm and normal heart sounds.  Exam reveals no gallop and no friction rub.   No murmur heard. Bradycardia  Pulmonary/Chest: Effort normal and breath sounds normal. No respiratory distress.  Abdominal: Soft. He exhibits no distension. There is no tenderness.  Musculoskeletal: He exhibits no edema or tenderness.  Neurological: He is alert.  Skin: Skin is warm and dry.  Psychiatric: He has a normal mood and affect. His behavior is normal. Thought content normal.  Nursing note and vitals reviewed.   ED Course  Procedures (including critical care time) Labs Review Labs Reviewed   CBC WITH DIFFERENTIAL - Abnormal; Notable for the following:    Platelets 139 (*)    Neutrophils Relative % 83 (*)    Neutro Abs 8.7 (*)    Lymphocytes Relative 9 (*)    All other components within normal limits  BASIC METABOLIC PANEL - Abnormal; Notable for the following:    Glucose, Bld 152 (*)    GFR calc non Af Amer 73 (*)    GFR calc Af Amer 85 (*)    All other components within normal limits  MAGNESIUM  TROPONIN I    Imaging Review No results found.   EKG Interpretation   Date/Time:  Tuesday May 24 2014 09:12:49 EST Ventricular Rate:  47 PR Interval:  167 QRS Duration: 103 QT Interval:  434 QTC Calculation: 384 R Axis:   123 Text Interpretation:  Sinus bradycardia Right axis deviation ED PHYSICIAN  INTERPRETATION AVAILABLE IN [facility] Confirmed by TEST, Record  ([id number]) on 05/26/2014 9:06:45 AM      MDM   Final diagnoses:  Bradycardia    63 year old male with systematic bradycardia. Recently evaluated for A. fib with RVR. Given multiple doses of Cardizem and 300 mg of flecainide. He did convert. Suspect his bradycardia is related all these medications on board and the long half-life of flecainide. I expect his symptoms and his heart rate to improve as the flecainide is metabolized. Will have him hold his dose of Cardizem this evening. He is comfortable with this plan. Return precautions were discussed. Outpatient cardiology follow-up otherwise.    Virgel Manifold, MD 06/01/14 2020

## 2014-05-24 NOTE — ED Notes (Signed)
MD at bedside. 

## 2014-05-24 NOTE — Discharge Instructions (Signed)
Bradycardia °Bradycardia is a term for a heart rate (pulse) that, in adults, is slower than 60 beats per minute. A normal rate is 60 to 100 beats per minute. A heart rate below 60 beats per minute may be normal for some adults with healthy hearts. If the rate is too slow, the heart may have trouble pumping the volume of blood the body needs. If the heart rate gets too low, blood flow to the brain may be decreased and may make you feel lightheaded, dizzy, or faint. °The heart has a natural pacemaker in the top of the heart called the SA node (sinoatrial or sinus node). This pacemaker sends out regular electrical signals to the muscle of the heart, telling the heart muscle when to beat (contract). The electrical signal travels from the upper parts of the heart (atria) through the AV node (atrioventricular node), to the lower chambers of the heart (ventricles). The ventricles squeeze, pumping the blood from your heart to your lungs and to the rest of your body. °CAUSES  °· Problem with the heart's electrical system. °· Problem with the heart's natural pacemaker. °· Heart disease, damage, or infection. °· Medications. °· Problems with minerals and salts (electrolytes). °SYMPTOMS  °· Fainting (syncope). °· Fatigue and weakness. °· Shortness of breath (dyspnea). °· Chest pain (angina). °· Drowsiness. °· Confusion. °DIAGNOSIS  °· An electrocardiogram (ECG) can help your caregiver determine the type of slow heart rate you have. °· If the cause is not seen on an ECG, you may need to wear a heart monitor that records your heart rhythm for several hours or days. °· Blood tests. °TREATMENT  °· Electrolyte supplements. °· Medications. °· Withholding medication which is causing a slow heart rate. °· Pacemaker placement. °SEEK IMMEDIATE MEDICAL CARE IF:  °· You feel lightheaded or faint. °· You develop an irregular heart rate. °· You feel chest pain or have trouble breathing. °MAKE SURE YOU:  °· Understand these  instructions. °· Will watch your condition. °· Will get help right away if you are not doing well or get worse. °Document Released: 03/09/2002 Document Revised: 09/09/2011 Document Reviewed: 09/22/2013 °ExitCare® Patient Information ©2015 [employer]. This information is not intended to replace advice given to you by your health care provider. Make sure you discuss any questions you have with your health care provider. ° °

## 2014-05-27 ENCOUNTER — Ambulatory Visit (INDEPENDENT_AMBULATORY_CARE_PROVIDER_SITE_OTHER): Payer: BC Managed Care – PPO | Admitting: Cardiology

## 2014-05-27 ENCOUNTER — Encounter: Payer: Self-pay | Admitting: Cardiology

## 2014-05-27 DIAGNOSIS — G4733 Obstructive sleep apnea (adult) (pediatric): Secondary | ICD-10-CM

## 2014-05-27 DIAGNOSIS — E669 Obesity, unspecified: Secondary | ICD-10-CM

## 2014-05-27 DIAGNOSIS — I10 Essential (primary) hypertension: Secondary | ICD-10-CM

## 2014-05-27 DIAGNOSIS — I480 Paroxysmal atrial fibrillation: Secondary | ICD-10-CM

## 2014-05-27 DIAGNOSIS — J069 Acute upper respiratory infection, unspecified: Secondary | ICD-10-CM | POA: Insufficient documentation

## 2014-05-27 MED ORDER — FLECAINIDE ACETATE 100 MG PO TABS: 100.0000 mg | ORAL_TABLET | Freq: Two times a day (BID) | ORAL | Status: DC

## 2014-05-27 MED ORDER — RIVAROXABAN 20 MG PO TABS: 20.0000 mg | ORAL_TABLET | Freq: Every day | ORAL | Status: DC

## 2014-05-27 NOTE — Assessment & Plan Note (Signed)
Overall fair control

## 2014-05-27 NOTE — Patient Instructions (Signed)
Your physician has recommended you make the following change in your medication: \   START FLECANIDE 100 MG TWICE A DAY   START XERALTO 20 MG  ONCE A DAY     FOLLOW UP APPOINTMENT IN THE AFIB CLINIC NEXT WEEK

## 2014-05-27 NOTE — Assessment & Plan Note (Signed)
Compliant with C-pap except recently secondary to URI

## 2014-05-27 NOTE — Assessment & Plan Note (Signed)
RFA 2010, 2013. Recurrent PAF with RVR last 3 days. SSS component with SB when in NSR

## 2014-05-27 NOTE — Progress Notes (Signed)
05/27/2014 [place]   May 07, 1951  [id number]  Primary Physician Shirline Frees, MD Primary Cardiologist: Dr Marlou Porch Dr Joylene Grapes  HPI:  63 y/o male with PAF,HTN, obesity, and sleep apnea on C-pap. He has had prior RFA 2010 and 2013 by Dr Joylene Grapes. He was on Flecainide in 2013 till after his RFA. He had done well until last week when he developed a URI. Pt says he developed a cough last Friday. He went to an Urgent care Sat- no specific Rx except fluids and rest. Monday he coughed and went into AF with RVR.  He took and extra Diltiazem 240 mg without relief and went tot the ED. He received an IV Diltiazem bolus which slowed his rate but he remained in AF. Dr Aundra Dubin was on and suggested a Flecainide one time dose of 300 mg. The pt says he converted as soon as he swallowed the pill. When in NSR his HR was 47. He was eventually sent home from the ER. Later that morning he had a near syncopal spell while a [employer]. He was taken back to the ER and received IVF, he was in NSR. This am he went to walk his dog and coughed- went back into rapid AF. He is sen now as an add on.   Current Outpatient Prescriptions  Medication Sig Dispense Refill  . benzonatate (TESSALON) 100 MG capsule Take 1 capsule (100 mg total) by mouth 3 (three) times daily as needed for cough. 21 capsule 0  . diltiazem (CARTIA XT) 240 MG 24 hr capsule Take 1 capsule (240 mg total) by mouth daily. 30 capsule 11  . meloxicam (MOBIC) 15 MG tablet Take 15 mg by mouth daily.   5  . omeprazole (PRILOSEC) 20 MG capsule Take 20 mg by mouth daily.    . flecainide (TAMBOCOR) 100 MG tablet Take 1 tablet (100 mg total) by mouth 2 (two) times daily. 60 tablet 5  . rivaroxaban (XARELTO) 20 MG TABS tablet Take 1 tablet (20 mg total) by mouth daily with supper. 30 tablet 5   No current facility-administered medications for this visit.    No Known Allergies  History   Social History  . Marital Status: Legally Separated    Spouse  Name: N/A    Number of Children: N/A  . Years of Education: N/A   Occupational History  . Not on file.   Social History Main Topics  . Smoking status: Never Smoker   . Smokeless tobacco: Not on file     Comment: denies any tobacco use   . Alcohol Use: 0.6 oz/week    1 Cans of beer per week     Comment: lass than 1 alcoholic beverage/week   . Drug Use: No  . Sexual Activity: Not on file   Other Topics Concern  . 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      Review of Systems: General: negative for chills, fever, night sweats or weight changes.  Cardiovascular: negative for chest pain, dyspnea on exertion, edema, orthopnea, paroxysmal nocturnal dyspnea or shortness of breath Dermatological: negative for rash Respiratory: negative for wheezing Urologic: negative for hematuria Abdominal: negative for nausea, vomiting, diarrhea, bright red blood per rectum, melena, or hematemesis Neurologic: negative for visual changes, syncope, or dizziness All other systems reviewed and are otherwise negative except as noted above.    Blood pressure 142/62, pulse 86, height 5\' 11"  (1.803 m),  weight 225 lb (102.059 kg).  General appearance: alert, cooperative, no distress and mildly obese Lungs: clear to auscultation bilaterally Heart: irregularly irregular rhythm  EKG AF with VR 148- Qtc 462  ASSESSMENT AND PLAN:   PAF (paroxysmal atrial fibrillation) RFA 2010, 2013. Recurrent PAF with RVR last 3 days. SSS component with SB when in NSR  OSA (obstructive sleep apnea) Compliant with C-pap except recently secondary to URI  Essential hypertension Overall fair control  Obesity (BMI 30-39.9) BMI 31  URI (upper respiratory infection) This sounds viral by history   PLAN  Pt seen by Dr Marlou Porch and myself. Plan is for Flecainide 100 mg BID and Xarelto 20 mg daily. He will follow up with Dr Marlou Porch on Dec 2nd and we will arrange a  follow up in the AF clinic as well.   Carlos Thomas KPA-C 05/27/2014 12:00 PM

## 2014-05-27 NOTE — Assessment & Plan Note (Signed)
BMI 31 °

## 2014-05-27 NOTE — Assessment & Plan Note (Signed)
This sounds viral by history

## 2014-05-31 ENCOUNTER — Ambulatory Visit (HOSPITAL_COMMUNITY)
Admission: RE | Admit: 2014-05-31 | Discharge: 2014-05-31 | Disposition: A | Discharge status: Home / Self Care | Payer: BC Managed Care – PPO | Source: Ambulatory Visit | Attending: Nurse Practitioner | Admitting: Nurse Practitioner

## 2014-05-31 ENCOUNTER — Encounter (HOSPITAL_COMMUNITY): Payer: Self-pay | Admitting: Nurse Practitioner

## 2014-05-31 DIAGNOSIS — Z79899 Other long term (current) drug therapy: Secondary | ICD-10-CM | POA: Diagnosis not present

## 2014-05-31 DIAGNOSIS — I480 Paroxysmal atrial fibrillation: Secondary | ICD-10-CM

## 2014-05-31 DIAGNOSIS — R001 Bradycardia, unspecified: Secondary | ICD-10-CM | POA: Diagnosis not present

## 2014-05-31 DIAGNOSIS — Z09 Encounter for follow-up examination after completed treatment for conditions other than malignant neoplasm: Secondary | ICD-10-CM | POA: Insufficient documentation

## 2014-05-31 DIAGNOSIS — R05 Cough: Secondary | ICD-10-CM | POA: Insufficient documentation

## 2014-05-31 DIAGNOSIS — I4891 Unspecified atrial fibrillation: Secondary | ICD-10-CM | POA: Diagnosis not present

## 2014-05-31 DIAGNOSIS — J069 Acute upper respiratory infection, unspecified: Secondary | ICD-10-CM | POA: Insufficient documentation

## 2014-05-31 NOTE — Progress Notes (Signed)
Primary Cardiologist:  Dr Marlou Porch Referring MD: DR. Bing Neighbors Carlos Thomas is a 62 y.o. male who presents today for f/u in afib clinic after an ER   and office visit for afib with RVR. He reports developing an viral URI with taking OTC decongestants and significant coughing which seemed to trigger the ER visit, 11/24. He was treated with IV diltiazem and flecainide 300 mg one time dose and converted within minutes of taking flecainide. He was d/c but  later was eating at [employer] and had a presyncopal spell and returned to the ED where he was noted to have bradycardia. Was hydrated and returned home.   He then presented 11/27 for an office visit with afib with RVR, seen by Kerin Ransom, PA and Dr. Marlou Porch. He was started on flecainide 100 mg bid and xarelto 20 mg a day. He converted to SR within a few hours of taking flecainide. His URI/cough is now improving and pt has stopped OTC decongestants. His last echo was in 2013, at the time of his last ablation, first ablation in 2010. He has not noticed any further afib. Has returned to using CPAP, was unable to the first few nights of URI. Pt has taken flecainide in past prior to ablations.    Today, he denies symptoms of  chest pain, shortness of breath,  lower extremity edema, dizziness, presyncope, or syncope. URI symptoms improving. The patient is otherwise without complaint today.   Past Medical History  Diagnosis Date  . HTN (hypertension)   . Atrial flutter     s/p CTI ablation 2010 by Dr Rayann Heman  . Atrial fibrillation     paroxysmal, s/p afib ablation 3/13 by Dr Rayann Heman  . GERD (gastroesophageal reflux disease)   . Pneumonia     hx of pneumonia  . H/O hiatal hernia   . Psychosocial stressors     chronic  . MRSA (methicillin resistant Staphylococcus aureus)     H/O flare ups  . Adenomatous polyps     tubular on colonoscopy 2/11  . OSA (obstructive sleep apnea)     Mild w AHI 6.57/hr now on 13cm H2O  . Obesity (BMI 30-39.9)     Past Surgical History  Procedure Laterality Date  . Cti ablation  7/10    for atrial flutter by Dr Rayann Heman  . Hernia repair    . Cholecystectomy    . Tee without cardioversion  09/12/2011    Procedure: TRANSESOPHAGEAL ECHOCARDIOGRAM (TEE);  Surgeon: Jettie Booze, MD;  [facility] ENDOSCOPY;  Service: Cardiovascular;  Laterality: N/A;  . Atrial fibrillation ablation  09/13/11    PVI by Dr Rayann Heman  . Colonoscopy  08/2009    Current Outpatient Prescriptions  Medication Sig Dispense Refill  . benzonatate (TESSALON) 100 MG capsule Take 1 capsule (100 mg total) by mouth 3 (three) times daily as needed for cough. 21 capsule 0  . diltiazem (CARTIA XT) 240 MG 24 hr capsule Take 1 capsule (240 mg total) by mouth daily. 30 capsule 11  . flecainide (TAMBOCOR) 100 MG tablet Take 1 tablet (100 mg total) by mouth 2 (two) times daily. 60 tablet 5  . omeprazole (PRILOSEC) 20 MG capsule Take 20 mg by mouth daily.    . rivaroxaban (XARELTO) 20 MG TABS tablet Take 1 tablet (20 mg total) by mouth daily with supper. 30 tablet 5  . meloxicam (MOBIC) 15 MG tablet Take 15 mg by mouth daily.   5  No current facility-administered medications for this encounter.    Physical Exam: Filed Vitals:   05/31/14 1415  BP: 140/62  Pulse: 63  Weight: 227 lb 2 oz (103.023 kg)  SpO2: 96%    GEN- The patient is well appearing, alert and oriented x 3 today.   Head- normocephalic, atraumatic Eyes-  Sclera clear, conjunctiva pink Ears- hearing intact Oropharynx- clear Lungs- Clear to ausculation bilaterally, normal work of breathing Heart- Regular rate and rhythm, no murmurs, rubs or gallops, PMI not laterally displaced GI- soft, NT, ND, + BS Extremities- no clubbing, cyanosis, or edema  ekg today reveals sinus rhythm at 58 bpm, rightward axis, NS t wave abnormality, pr 164 ms, QRS 29ms, QTc 415ms.  Assessment and Plan:  1. PAF Appears situational in the setting of URI/coughing and use of OTC  decongestants. Maintaining SR on flecainide. Continue for now. OTC decongestants discouraged. Back to CPAP use now that URI symptoms improved.  2. This patients CHA2DS2-VASc Score and unadjusted Ischemic Stroke Rate (% per year) is equal to 0.6 % stroke rate/year from a score of 1(age of 65)  Continue Xarelto for now.  Above score calculated as 1 point each if present [CHF, HTN, DM, Vascular=MI/PAD/Aortic Plaque, Age if 65-74, or Male] Above score calculated as 2 points each if present [Age > 75, or Stroke/TIA/TE]  3. Echo   4. Healthy lifestyle encouraged. Pt was given info regarding free dietitian  class for weight loss offered by [facility]. Regular exercise encouraged.  5. F/u with Dr. Rayann Heman within one month.  Can discuss at that time if pt can stop AAD, NOAC if maintaining SR.  6. Pt had a f/u with Dr. Marlou Porch tomorrow but will d/c now that pt seen in [place] clinic and improved.

## 2014-05-31 NOTE — Patient Instructions (Signed)
Your physician has requested that you have an echocardiogram. Echocardiography is a painless test that uses sound waves to create images of your heart. It provides your doctor with information about the size and shape of your heart and how well your heart's chambers and valves are working. This procedure takes approximately one hour. There are no restrictions for this procedure.  Friday 06/03/14 AT 2 PM  Your physician recommends that you schedule a follow-up appointment in: 1 month with Dr Rayann Heman (Mon Jan 4th at 1:45)

## 2014-06-01 ENCOUNTER — Encounter: Payer: BC Managed Care – PPO | Admitting: Cardiology

## 2014-06-01 NOTE — Addendum Note (Signed)
Encounter addended by: Cori Razor, CCT on: 06/01/2014  9:57 AM<BR>     Documentation filed: Charges VN

## 2014-06-03 ENCOUNTER — Ambulatory Visit (HOSPITAL_COMMUNITY): Payer: BC Managed Care – PPO | Attending: Cardiology | Admitting: Radiology

## 2014-06-03 DIAGNOSIS — I480 Paroxysmal atrial fibrillation: Secondary | ICD-10-CM | POA: Insufficient documentation

## 2014-06-03 HISTORY — PX: TRANSTHORACIC ECHOCARDIOGRAM: SHX275

## 2014-06-03 NOTE — Progress Notes (Signed)
Echocardiogram performed.  

## 2014-06-09 ENCOUNTER — Encounter (HOSPITAL_COMMUNITY): Payer: Self-pay | Admitting: Internal Medicine

## 2014-07-04 ENCOUNTER — Encounter: Payer: Self-pay | Admitting: Internal Medicine

## 2014-07-04 ENCOUNTER — Ambulatory Visit (INDEPENDENT_AMBULATORY_CARE_PROVIDER_SITE_OTHER): Payer: BC Managed Care – PPO | Admitting: Internal Medicine

## 2014-07-04 VITALS — BP 140/70 | HR 53 | Ht 71.0 in | Wt 230.0 lb

## 2014-07-04 DIAGNOSIS — I480 Paroxysmal atrial fibrillation: Secondary | ICD-10-CM

## 2014-07-04 DIAGNOSIS — E669 Obesity, unspecified: Secondary | ICD-10-CM

## 2014-07-04 NOTE — Progress Notes (Deleted)
Primary Cardiologist:  Dr Marlou Porch Referring MD: DR. Bing Neighbors Carlos Thomas is a 64 y.o. male who presents today for f/u in afib clinic after an ER   and office visit for afib with RVR. He reports developing an viral URI with taking OTC decongestants and significant coughing which seemed to trigger the ER visit, 11/24. He was treated with IV diltiazem and flecainide 300 mg one time dose and converted within minutes of taking flecainide. He was d/c but  later was eating at [employer] and had a presyncopal spell and returned to the ED where he was noted to have bradycardia. Was hydrated and returned home.   He then presented 11/27 for an office visit with afib with RVR, seen by Kerin Ransom, PA and Dr. Marlou Porch. He was started on flecainide 100 mg bid and xarelto 20 mg a day. He converted to SR within a few hours of taking flecainide. His URI/cough is now improving and pt has stopped OTC decongestants. His last echo was in 2013, at the time of his last ablation, first ablation in 2010. He has not noticed any further afib. Has returned to using CPAP, was unable to the first few nights of URI. Pt has taken flecainide in past prior to ablations.  He returns today reporting no afib, maintaining SR. He has continued on flecainide 100 mg bid and NOAC for chads vasc of 1.    Today, he denies symptoms of  chest pain, shortness of breath,  lower extremity edema, dizziness, presyncope, or syncope. URI symptoms improving. The patient is otherwise without complaint today.   Past Medical History  Diagnosis Date  . HTN (hypertension)   . Atrial flutter     s/p CTI ablation 2010 by Dr Rayann Heman  . Atrial fibrillation     paroxysmal, s/p afib ablation 3/13 by Dr Rayann Heman  . GERD (gastroesophageal reflux disease)   . Pneumonia     hx of pneumonia  . H/O hiatal hernia   . Psychosocial stressors     chronic  . MRSA (methicillin resistant Staphylococcus aureus)     H/O flare ups  . Adenomatous polyps    tubular on colonoscopy 2/11  . OSA (obstructive sleep apnea)     Mild w AHI 6.57/hr now on 13cm H2O  . Obesity (BMI 30-39.9)    Past Surgical History  Procedure Laterality Date  . Cti ablation  7/10    for atrial flutter by Dr Rayann Heman  . Hernia repair    . Cholecystectomy    . Tee without cardioversion  09/12/2011    Procedure: TRANSESOPHAGEAL ECHOCARDIOGRAM (TEE);  Surgeon: Jettie Booze, MD;  Location[facility] ENDOSCOPY;  Service: Cardiovascular;  Laterality: N/A;  . Atrial fibrillation ablation  09/13/11    PVI by Dr Rayann Heman  . Colonoscopy  08/2009  . Atrial fibrillation ablation N/A 09/13/2011    Procedure: ATRIAL FIBRILLATION ABLATION;  Surgeon: Thompson Grayer, MD;  Location: [facility] CATH LAB;  Service: Cardiovascular;  Laterality: N/A;    Current Outpatient Prescriptions  Medication Sig Dispense Refill  . diltiazem (CARTIA XT) 240 MG 24 hr capsule Take 1 capsule (240 mg total) by mouth daily. 30 capsule 11  . flecainide (TAMBOCOR) 100 MG tablet Take 1 tablet (100 mg total) by mouth 2 (two) times daily. 60 tablet 5  . omeprazole (PRILOSEC) 20 MG capsule Take 20 mg by mouth daily.    . rivaroxaban (XARELTO) 20 MG TABS tablet Take 1 tablet (20 mg total)  by mouth daily with supper. 30 tablet 5   No current facility-administered medications for this visit.    Physical Exam: Filed Vitals:   07/04/14 1346  BP: 140/70  Pulse: 53  Height: 5\' 11"  (1.803 m)  Weight: 230 lb (104.327 kg)    GEN- The patient is well appearing, alert and oriented x 3 today.   Head- normocephalic, atraumatic Eyes-  Sclera clear, conjunctiva pink Ears- hearing intact Oropharynx- clear Lungs- Clear to ausculation bilaterally, normal work of breathing Heart- Regular rate and rhythm, no murmurs, rubs or gallops, PMI not laterally displaced GI- soft, NT, ND, + BS Extremities- no clubbing, cyanosis, or edema  ekg today reveals sinus rhythm at 53 bpm, possible RVH, nonspecific T wave abnormality, Pr int 160 ms,  QRS 31ms, QTc 365 ms. Echo 06/03/14-Left ventricle: The cavity size was normal. Systolic function was normal. The estimated ejection fraction was in the range of 55% to 60%. Wall motion was normal; there were no regional wall motion abnormalities. - Mitral valve: There was trivial regurgitation. - Right ventricle: The cavity size was mildly dilated. Wall thickness was normal. - Tricuspid valve: There was trivial regurgitation.  Assessment and Plan:  1. PAF Appears situational in the setting of URI/coughing and use of OTC decongestants. Maintaining SR on flecainide, decrease to 50 mg bid and if no afib in one month, discontinue.  OTC decongestants discouraged. Continue regular use of CPAP. Continue Cardizem.  2. This patients CHA2DS2-VASc Score and unadjusted Ischemic Stroke Rate (% per year) is equal to 0.6 % stroke rate/year from a score of 1(age of 65)  Continue Xarelto for now.  Above score calculated as 1 point each if present [CHF, HTN, DM, Vascular=MI/PAD/Aortic Plaque, Age if 65-74, or Male] Above score calculated as 2 points each if present [Age > 75, or Stroke/TIA/TE]  3. Healthy lifestyle encouraged. Pt was given info regarding free dietitian  class for weight loss offered by [facility]. Regular exercise encouraged.  4. F/u afib clinic in 3 months.  F/u with Dr. Etter Sjogren  6-9 months.

## 2014-07-04 NOTE — Patient Instructions (Addendum)
Your physician has recommended you make the following change in your medication:  1) Decrease flecainide to 50 mg twice daily x 1 month- if no symptoms of a-fib after 1 month you may stop flecainide.  Your physician wants you to follow-up in: 3 months with Roderic Palau, NP. You will receive a reminder letter in the mail two months in advance. If you don't receive a letter, please call our office to schedule the follow-up appointment.  Your physician wants you to follow-up in: 9 months with Dr. Marlou Porch. You will receive a reminder letter in the mail two months in advance. If you don't receive a letter, please call our office to schedule the follow-up appointment.

## 2014-07-05 NOTE — Progress Notes (Signed)
Primary Cardiologist:  Dr Marlou Porch Referring MD: DR. Bing Neighbors Carlos Thomas is a 64 y.o. male who presents today for f/u in afib clinic after an ER and office visit for afib with RVR. He reports developing an viral URI with taking OTC decongestants and significant coughing which seemed to trigger the ER visit, 11/24. He was treated with IV diltiazem and flecainide 300 mg one time dose and converted within minutes of taking flecainide. He was d/c but  later was eating at [employer] and had a presyncopal spell and returned to the ED where he was noted to have bradycardia. Was hydrated and returned home.   He then presented 11/27 for an office visit with afib with RVR, seen by Kerin Ransom, PA and Dr. Marlou Porch. He was started on flecainide 100 mg bid and xarelto 20 mg a day. He converted to SR within a few hours of taking flecainide. His URI/cough is now improving and pt has stopped OTC decongestants. His last echo was in 2013, at the time of his last ablation, first ablation in 2010. He has not noticed any further afib. Has returned to using CPAP, was unable to the first few nights of URI. Pt has taken flecainide in past prior to ablations.  He returns today reporting no afib, maintaining SR. He has continued on flecainide 100 mg bid and NOAC for chads vasc of 1.    Today, he denies symptoms of  chest pain, shortness of breath,  lower extremity edema, dizziness, presyncope, or syncope. URI symptoms improving. The patient is otherwise without complaint today.   Past Medical History  Diagnosis Date  . HTN (hypertension)   . Atrial flutter     s/p CTI ablation 2010 by Dr Rayann Heman  . Atrial fibrillation     paroxysmal, s/p afib ablation 3/13 by Dr Rayann Heman  . GERD (gastroesophageal reflux disease)   . Pneumonia     hx of pneumonia  . H/O hiatal hernia   . Psychosocial stressors     chronic  . MRSA (methicillin resistant Staphylococcus aureus)     H/O flare ups  . Adenomatous polyps    tubular on colonoscopy 2/11  . OSA (obstructive sleep apnea)     Mild w AHI 6.57/hr now on 13cm H2O  . Obesity (BMI 30-39.9)    Past Surgical History  Procedure Laterality Date  . Cti ablation  7/10    for atrial flutter by Dr Rayann Heman  . Hernia repair    . Cholecystectomy    . Tee without cardioversion  09/12/2011    Procedure: TRANSESOPHAGEAL ECHOCARDIOGRAM (TEE);  Surgeon: Jettie Booze, MD;  Location: [facility] ENDOSCOPY;  Service: Cardiovascular;  Laterality: N/A;  . Atrial fibrillation ablation  09/13/11    PVI by Dr Rayann Heman  . Colonoscopy  08/2009  . Atrial fibrillation ablation N/A 09/13/2011    Procedure: ATRIAL FIBRILLATION ABLATION;  Surgeon: Thompson Grayer, MD;  Location: [facility] CATH LAB;  Service: Cardiovascular;  Laterality: N/A;    Current Outpatient Prescriptions  Medication Sig Dispense Refill  . diltiazem (CARTIA XT) 240 MG 24 hr capsule Take 1 capsule (240 mg total) by mouth daily. 30 capsule 11  . flecainide (TAMBOCOR) 100 MG tablet Take 1 tablet (100 mg total) by mouth 2 (two) times daily. 60 tablet 5  . omeprazole (PRILOSEC) 20 MG capsule Take 20 mg by mouth daily.    . rivaroxaban (XARELTO) 20 MG TABS tablet Take 1 tablet (20 mg total) by mouth  daily with supper. 30 tablet 5   No current facility-administered medications for this visit.    Physical Exam: Filed Vitals:   07/04/14 1346  BP: 140/70  Pulse: 53  Height: 5\' 11"  (1.803 m)  Weight: 230 lb (104.327 kg)    GEN- The patient is well appearing, alert and oriented x 3 today.   Head- normocephalic, atraumatic Eyes-  Sclera clear, conjunctiva pink Ears- hearing intact Oropharynx- clear Lungs- Clear to ausculation bilaterally, normal work of breathing Heart- Regular rate and rhythm, no murmurs, rubs or gallops, PMI not laterally displaced GI- soft, NT, ND, + BS Extremities- no clubbing, cyanosis, or edema  ekg today reveals sinus rhythm at 53 bpm, possible RVH, nonspecific T wave abnormality, Pr int 160 ms,  QRS 38ms, QTc 365 ms. Echo 06/03/14-Left ventricle: The cavity size was normal. Systolic function was normal. The estimated ejection fraction was in the range of 55% to 60%. Wall motion was normal; there were no regional wall motion abnormalities. - Mitral valve: There was trivial regurgitation. - Right ventricle: The cavity size was mildly dilated. Wall thickness was normal. - Tricuspid valve: There was trivial regurgitation.  Assessment and Plan:  1. PAF Appears situational in the setting of URI/coughing and use of OTC decongestants. Maintaining SR on flecainide, decrease to 50 mg bid and if no afib in one month, discontinue.  OTC decongestants discouraged. Continue regular use of CPAP. Continue Cardizem.  2. This patients CHA2DS2-VASc Score and unadjusted Ischemic Stroke Rate (% per year) is equal to 0.6 % stroke rate/year from a score of 1(age of 65)  Continue Xarelto for now.  Could likely stop this upon follow-up in the AF clnic  Above score calculated as 1 point each if present [CHF, HTN, DM, Vascular=MI/PAD/Aortic Plaque, Age if 65-74, or Male] Above score calculated as 2 points each if present [Age > 75, or Stroke/TIA/TE]  3. Healthy lifestyle encouraged. Pt was given info regarding free dietitian class for weight loss offered by [facility]. Regular exercise encouraged.  4. F/u afib clinic in 3 months.  F/u with Dr. Etter Sjogren  6-9 months.

## 2014-07-13 ENCOUNTER — Other Ambulatory Visit: Payer: Self-pay | Admitting: *Deleted

## 2014-07-13 MED ORDER — DILTIAZEM HCL ER COATED BEADS 240 MG PO CP24: 240.0000 mg | ORAL_CAPSULE | Freq: Every day | ORAL | Status: DC

## 2014-10-07 ENCOUNTER — Encounter (HOSPITAL_COMMUNITY): Payer: Self-pay | Admitting: Nurse Practitioner

## 2014-10-07 ENCOUNTER — Other Ambulatory Visit: Payer: Self-pay

## 2014-10-07 ENCOUNTER — Ambulatory Visit (HOSPITAL_COMMUNITY)
Admission: RE | Admit: 2014-10-07 | Discharge: 2014-10-07 | Disposition: A | Discharge status: Home / Self Care | Payer: BC Managed Care – PPO | Source: Ambulatory Visit | Attending: Nurse Practitioner | Admitting: Nurse Practitioner

## 2014-10-07 VITALS — BP 120/82 | HR 60 | Ht 71.0 in | Wt 226.6 lb

## 2014-10-07 DIAGNOSIS — I445 Left posterior fascicular block: Secondary | ICD-10-CM | POA: Diagnosis not present

## 2014-10-07 DIAGNOSIS — I4891 Unspecified atrial fibrillation: Secondary | ICD-10-CM | POA: Insufficient documentation

## 2014-10-07 DIAGNOSIS — I481 Persistent atrial fibrillation: Secondary | ICD-10-CM | POA: Diagnosis not present

## 2014-10-07 DIAGNOSIS — K219 Gastro-esophageal reflux disease without esophagitis: Secondary | ICD-10-CM | POA: Insufficient documentation

## 2014-10-07 DIAGNOSIS — I10 Essential (primary) hypertension: Secondary | ICD-10-CM | POA: Insufficient documentation

## 2014-10-07 DIAGNOSIS — Z6831 Body mass index (BMI) 31.0-31.9, adult: Secondary | ICD-10-CM | POA: Diagnosis not present

## 2014-10-07 DIAGNOSIS — G4733 Obstructive sleep apnea (adult) (pediatric): Secondary | ICD-10-CM | POA: Diagnosis not present

## 2014-10-07 DIAGNOSIS — Z79899 Other long term (current) drug therapy: Secondary | ICD-10-CM | POA: Diagnosis not present

## 2014-10-07 DIAGNOSIS — Z7902 Long term (current) use of antithrombotics/antiplatelets: Secondary | ICD-10-CM | POA: Insufficient documentation

## 2014-10-07 DIAGNOSIS — I4819 Other persistent atrial fibrillation: Secondary | ICD-10-CM

## 2014-10-07 DIAGNOSIS — E669 Obesity, unspecified: Secondary | ICD-10-CM | POA: Diagnosis not present

## 2014-10-07 MED ORDER — DILTIAZEM HCL ER COATED BEADS 240 MG PO CP24: 240.0000 mg | ORAL_CAPSULE | Freq: Every day | ORAL | Status: DC

## 2014-10-07 NOTE — Progress Notes (Addendum)
Primary Cardiologist:  Dr Marlou Porch Electrophysiologist: Dr. Loney Hering Carlos Thomas is a 64 y.o. male who presents today for f/u in afib clinic. He had an episode of afib with bronchitis in November of 2015. Resumed SR with flecainide and was placed back on xarelto. When he saw Dr. Rayann Heman in January, the plan was to wean off flecainide, which the pt has done and has not had any return of afib. He has a chads2vasc score of one (age 70) and is low risk for stroke. After discussion with pt,and according to guidelines with a chadsvasc of 1,  he does not require a blood thinner on a daily basis. He decided he will stop taking but keep on hand if afib reoccurs in the future. Is compliant with cpap and exercising on a regular basis.    Today, he denies symptoms of  chest pain, shortness of breath,  lower extremity edema, dizziness, presyncope, or syncope. URI symptoms improving. The patient is otherwise without complaint today.   Past Medical History  Diagnosis Date  . HTN (hypertension)   . Atrial flutter     s/p CTI ablation 2010 by Dr Rayann Heman  . Atrial fibrillation     paroxysmal, s/p afib ablation 3/13 by Dr Rayann Heman  . GERD (gastroesophageal reflux disease)   . Pneumonia     hx of pneumonia  . H/O hiatal hernia   . Psychosocial stressors     chronic  . MRSA (methicillin resistant Staphylococcus aureus)     H/O flare ups  . Adenomatous polyps     tubular on colonoscopy 2/11  . OSA (obstructive sleep apnea)     Mild w AHI 6.57/hr now on 13cm H2O  . Obesity (BMI 30-39.9)    Past Surgical History  Procedure Laterality Date  . Cti ablation  7/10    for atrial flutter by Dr Rayann Heman  . Hernia repair    . Cholecystectomy    . Tee without cardioversion  09/12/2011    Procedure: TRANSESOPHAGEAL ECHOCARDIOGRAM (TEE);  Surgeon: Jettie Booze, MD;  Location: [facility] ENDOSCOPY;  Service: Cardiovascular;  Laterality: N/A;  . Atrial fibrillation ablation  09/13/11    PVI by Dr Rayann Heman  .  Colonoscopy  08/2009  . Atrial fibrillation ablation N/A 09/13/2011    Procedure: ATRIAL FIBRILLATION ABLATION;  Surgeon: Thompson Grayer, MD;  Location: [facility] CATH LAB;  Service: Cardiovascular;  Laterality: N/A;    Current Outpatient Prescriptions  Medication Sig Dispense Refill  . diltiazem (CARTIA XT) 240 MG 24 hr capsule Take 1 capsule (240 mg total) by mouth daily. 30 capsule 11  . flecainide (TAMBOCOR) 100 MG tablet Take 1 tablet (100 mg total) by mouth 2 (two) times daily. 60 tablet 5  . omeprazole (PRILOSEC) 20 MG capsule Take 20 mg by mouth daily.    . rivaroxaban (XARELTO) 20 MG TABS tablet Take 1 tablet (20 mg total) by mouth daily with supper. 30 tablet 5   No current facility-administered medications for this visit.    Physical Exam: Filed Vitals:   07/04/14 1346  BP: 140/70  Pulse: 53  Height: 5\' 11"  (1.803 m)  Weight: 230 lb (104.327 kg)    GEN- The patient is well appearing, alert and oriented x 3 today.   Head- normocephalic, atraumatic Eyes-  Sclera clear, conjunctiva pink Ears- hearing intact Oropharynx- clear Lungs- Clear to ausculation bilaterally, normal work of breathing Heart- Regular rate and rhythm, no murmurs, rubs or gallops, PMI not laterally displaced GI-  soft, NT, ND, + BS Extremities- no clubbing, cyanosis, or edema  ekg today reveals sinus rhythm at 60 bpm, PR  int 140 ms, QRS 18ms, QTc 398 ms.  Echo 06/03/14-Left ventricle: The cavity size was normal. Systolic function was normal. The estimated ejection fraction was in the range of 55% to 60%. Wall motion was normal; there were no regional wall motion abnormalities. - Mitral valve: There was trivial regurgitation. - Right ventricle: The cavity size was mildly dilated. Wall thickness was normal. - Tricuspid valve: There was trivial regurgitation.  Assessment and Plan:  1. PAF Appears situational in the setting of URI/coughing and use of OTC decongestants, 11/15. Maintaining SR, now off  flecainide.  OTC decongestants discouraged. Continue regular use of CPAP. Continue Cardizem.  2. This patients CHA2DS2-VASc Score and unadjusted Ischemic Stroke Rate (% per year) is equal to 0.6 % stroke rate/year from a score of 1(age of 65)  Pt has decided to stop xarelto . Will keep on hand if afib returns.  Above score calculated as 1 point each if present [CHF, HTN, DM, Vascular=MI/PAD/Aortic Plaque, Age if 65-74, or Male] Above score calculated as 2 points each if present [Age > 75, or Stroke/TIA/TE]  3. Regular exercise encouraged.  4. F/u Dr.Skains in 6 months Afib clinic/Dr. Allred as needed.     Patient ID: Carlos Thomas, male   DOB: [birth year], 64 y.o.   MRN: [id number]

## 2014-10-07 NOTE — Patient Instructions (Signed)
Follow up with Dr. Marlou Porch in 6 months.  Your physician has recommended you make the following change in your medication:  1)stop xarelto

## 2014-10-28 ENCOUNTER — Ambulatory Visit
Admission: RE | Admit: 2014-10-28 | Discharge: 2014-10-28 | Disposition: A | Discharge status: Home / Self Care | Payer: BC Managed Care – PPO | Source: Ambulatory Visit | Attending: Family Medicine | Admitting: Family Medicine

## 2014-10-28 ENCOUNTER — Other Ambulatory Visit: Payer: Self-pay | Admitting: Family Medicine

## 2014-10-28 DIAGNOSIS — S20211A Contusion of right front wall of thorax, initial encounter: Secondary | ICD-10-CM

## 2014-12-01 ENCOUNTER — Ambulatory Visit (INDEPENDENT_AMBULATORY_CARE_PROVIDER_SITE_OTHER): Payer: BC Managed Care – PPO | Admitting: Cardiology

## 2014-12-01 ENCOUNTER — Encounter: Payer: Self-pay | Admitting: Cardiology

## 2014-12-01 VITALS — BP 156/70 | HR 60 | Ht 71.0 in | Wt 232.8 lb

## 2014-12-01 DIAGNOSIS — G4733 Obstructive sleep apnea (adult) (pediatric): Secondary | ICD-10-CM | POA: Diagnosis not present

## 2014-12-01 DIAGNOSIS — I10 Essential (primary) hypertension: Secondary | ICD-10-CM

## 2014-12-01 DIAGNOSIS — E669 Obesity, unspecified: Secondary | ICD-10-CM

## 2014-12-01 NOTE — Progress Notes (Signed)
Cardiology Office Note   Date:  12/01/2014   ID:  Carlos Thomas, DOB 1950/11/21, MRN [id number]  PCP:  Shirline Frees, MD    No chief complaint on file.     History of Present Illness: Carlos Thomas is a 64 y.o. male with a history of OSA and obesity who presents today for followup. He is doing well with his CPAP device. He tolerates his CPAP without any problems. He tolerates the full face mask and feels the pressure is adequate. He denies any nasal congestion or dryness except for his eyes and uses eye gtts at night. He feels rested in the am and has no daytime sleepiness. He does not think he snores at night. He does not get much aerobic exercise.    Past Medical History  Diagnosis Date  . HTN (hypertension)   . Atrial flutter     s/p CTI ablation 2010 by Dr Rayann Heman  . Atrial fibrillation     paroxysmal, s/p afib ablation 3/13 by Dr Rayann Heman  . GERD (gastroesophageal reflux disease)   . Pneumonia     hx of pneumonia  . H/O hiatal hernia   . Psychosocial stressors     chronic  . MRSA (methicillin resistant Staphylococcus aureus)     H/O flare ups  . Adenomatous polyps     tubular on colonoscopy 2/11  . OSA (obstructive sleep apnea)     Mild w AHI 6.57/hr now on 13cm H2O  . Obesity (BMI 30-39.9)     Past Surgical History  Procedure Laterality Date  . Cti ablation  7/10    for atrial flutter by Dr Rayann Heman  . Hernia repair    . Cholecystectomy    . Tee without cardioversion  09/12/2011    Procedure: TRANSESOPHAGEAL ECHOCARDIOGRAM (TEE);  Surgeon: Jettie Booze, MD;  Location: [facility] ENDOSCOPY;  Service: Cardiovascular;  Laterality: N/A;  . Atrial fibrillation ablation  09/13/11    PVI by Dr Rayann Heman  . Colonoscopy  08/2009  . Atrial fibrillation ablation N/A 09/13/2011    Procedure: ATRIAL FIBRILLATION ABLATION;  Surgeon: Thompson Grayer, MD;  Location[facility] CATH LAB;  Service: Cardiovascular;  Laterality: N/A;     Current Outpatient  Prescriptions  Medication Sig Dispense Refill  . diltiazem (CARTIA XT) 240 MG 24 hr capsule Take 1 capsule (240 mg total) by mouth daily. 30 capsule 5  . meloxicam (MOBIC) 15 MG tablet   0  . omeprazole (PRILOSEC) 20 MG capsule Take 20 mg by mouth daily.     No current facility-administered medications for this visit.    Allergies:   Review of patient's allergies indicates no known allergies.    Social History:  The patient  reports that he has never smoked. He does not have any smokeless tobacco history on file. He reports that he drinks about 0.6 oz of alcohol per week. He reports that he does not use illicit drugs.   Family History:  The patient's family history includes CAD in his father; Heart attack in his father. There is no history of Atrial fibrillation or Anesthesia problems.    ROS:  Please see the history of present illness.   Otherwise, review of systems are positive for none.   All other systems are reviewed and negative.    PHYSICAL EXAM: VS:  BP 156/70 mmHg  Pulse 60  Ht 5\' 11"  (1.803 m)  Wt 232  lb 12.8 oz (105.597 kg)  BMI 32.48 kg/m2 , BMI Body mass index is 32.48 kg/(m^2). GEN: Well nourished, well developed, in no acute distress HEENT: normal Neck: no JVD, carotid bruits, or masses Cardiac: RRR; no murmurs, rubs, or gallops,no edema  Respiratory:  clear to auscultation bilaterally, normal work of breathing GI: soft, nontender, nondistended, + BS MS: no deformity or atrophy Skin: warm and dry, no rash Neuro:  Strength and sensation are intact Psych: euthymic mood, full affect   EKG:  EKG is not ordered today.    Recent Labs: 05/24/2014: BUN 13; Creatinine 1.04; Hemoglobin 15.4; Magnesium 2.0; Platelets 139*; Potassium 4.8; Sodium 143    Lipid Panel No results found for: CHOL, TRIG, HDL, CHOLHDL, VLDL, LDLCALC, LDLDIRECT    Wt Readings from Last 3 Encounters:  12/01/14 232 lb 12.8 oz (105.597 kg)  07/04/14 230 lb (104.327 kg)  05/27/14 225 lb  (102.059 kg)       ASSESSMENT AND PLAN:  1. OSA on CPAP and tolerating well. His download today showed an AHI of 0.3 /hr on 7cm H2O and 95% compliance in using more than 4 hours nightly.  2. Obesity - he has gained about 5 lbs and I have encouraged him to try to get into a routine exercise program 3. HTN borderline controlled.  At home his BP runs 120-130/60's.   - continue diltiazem  Current medicines are reviewed at length with the patient today.  The patient does not have concerns regarding medicines.  The following changes have been made:  no change  Labs/ tests ordered today: See above Assessment and Plan No orders of the defined types were placed in this encounter.     Disposition:   FU with me in 1 year  Signed, Sueanne Margarita, MD  12/01/2014 2:29 PM    [address], [place], [place]  [address] Phone: [phone]; Fax: [phone]

## 2014-12-01 NOTE — Patient Instructions (Signed)

## 2014-12-15 ENCOUNTER — Encounter: Payer: Self-pay | Admitting: Cardiology

## 2015-01-06 ENCOUNTER — Other Ambulatory Visit: Payer: Self-pay | Admitting: Cardiology

## 2015-01-12 ENCOUNTER — Other Ambulatory Visit: Payer: Self-pay | Admitting: Cardiology

## 2015-01-12 ENCOUNTER — Telehealth: Payer: Self-pay | Admitting: Cardiology

## 2015-01-12 NOTE — Telephone Encounter (Signed)
New Message  Pt has sched appt w/ Dr. Marlou Porch on 9/30, calling about refill of Cartia (diltiazem). Please call back and discuss.

## 2015-01-13 ENCOUNTER — Other Ambulatory Visit: Payer: Self-pay

## 2015-01-13 MED ORDER — DILTIAZEM HCL ER COATED BEADS 240 MG PO CP24: 240.0000 mg | ORAL_CAPSULE | Freq: Every day | ORAL | Status: DC

## 2015-01-13 NOTE — Telephone Encounter (Signed)
Spoke with patient. Refilled his meds till Sept 30 appointment.

## 2015-02-15 DIAGNOSIS — H5203 Hypermetropia, bilateral: Secondary | ICD-10-CM | POA: Diagnosis not present

## 2015-02-15 DIAGNOSIS — H2513 Age-related nuclear cataract, bilateral: Secondary | ICD-10-CM | POA: Diagnosis not present

## 2015-02-15 DIAGNOSIS — H35033 Hypertensive retinopathy, bilateral: Secondary | ICD-10-CM | POA: Diagnosis not present

## 2015-03-03 DIAGNOSIS — H16213 Exposure keratoconjunctivitis, bilateral: Secondary | ICD-10-CM | POA: Diagnosis not present

## 2015-03-21 DIAGNOSIS — H16213 Exposure keratoconjunctivitis, bilateral: Secondary | ICD-10-CM | POA: Diagnosis not present

## 2015-03-31 ENCOUNTER — Encounter: Payer: Self-pay | Admitting: Cardiology

## 2015-03-31 ENCOUNTER — Ambulatory Visit (INDEPENDENT_AMBULATORY_CARE_PROVIDER_SITE_OTHER): Payer: Medicare Other | Admitting: Cardiology

## 2015-03-31 VITALS — BP 150/78 | HR 57 | Ht 71.0 in | Wt 233.0 lb

## 2015-03-31 DIAGNOSIS — I10 Essential (primary) hypertension: Secondary | ICD-10-CM

## 2015-03-31 DIAGNOSIS — E669 Obesity, unspecified: Secondary | ICD-10-CM | POA: Diagnosis not present

## 2015-03-31 DIAGNOSIS — G4733 Obstructive sleep apnea (adult) (pediatric): Secondary | ICD-10-CM

## 2015-03-31 DIAGNOSIS — I480 Paroxysmal atrial fibrillation: Secondary | ICD-10-CM

## 2015-03-31 MED ORDER — FLECAINIDE ACETATE 100 MG PO TABS: 100.0000 mg | ORAL_TABLET | ORAL | Status: DC | PRN

## 2015-03-31 MED ORDER — DILTIAZEM HCL ER COATED BEADS 240 MG PO CP24: 240.0000 mg | ORAL_CAPSULE | Freq: Every day | ORAL | Status: DC

## 2015-03-31 NOTE — Patient Instructions (Addendum)
Medication Instructions:  Your physician has recommended you make the following change in your medication:  1- START Flecainide 100 mg by mouth twice daily as needed   Labwork: NONE  Testing/Procedures: NONE  Follow-Up: Your physician wants you to follow-up in: 6 months with Dr. Marlou Porch. You will receive a reminder letter in the mail two months in advance. If you don't receive a letter, please call our office to schedule the follow-up appointment.   Any Other Special Instructions Will Be Listed Below (If Applicable).

## 2015-03-31 NOTE — Progress Notes (Signed)
[place]. [address], [address], [place]  [address] Phone: [phone] Fax:  [phone]  Date:  03/31/2015   ID:  Carlos Thomas, DOB October 26, 1950, MRN [id number]  PCP:  Shirline Frees, MD  Has been seen in atrial fibrillation clinic.  History of Present Illness: Carlos Thomas is a 64 y.o. male with paroxysmal atrial fibrillation last seen by Dr. Rayann Heman  status post ablation in 2013. An episode of atrial fibrillation with upper respiratory infection 11/15. Sinus rhythm was restored with flecainide and he was placed back on anticoagulation with Xarelto. He weaned off the flecainide however with Dr. Rayann Heman 07/2014 and has not had any return of atrial fibrillation. CHADSVAsc -1 and he does not require anticoagulation.  Minimal palpitations.  Had to use Flecainide night before last. [place] hunting dog foster. Constant barking. Felt anxiety. Took an extra Cardizem and 100mg  Flecainide.   L-arginine supplement - toes going numb. Good distal pulses. 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  Mild OSH.  divorce. Fostering puppies.    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     Denies any chest pain, syncope, bleeding, orthopnea, strokelike symptoms, PND  Wt Readings from Last 3 Encounters:  03/31/15 233 lb (105.688 kg)  12/01/14 232 lb 12.8 oz (105.597 kg)  10/07/14 226 lb 9.6 oz (102.785 kg)     Past Medical History  Diagnosis Date  . HTN (hypertension)   . Atrial flutter     s/p CTI ablation 2010 by Dr Rayann Heman  . Atrial fibrillation     paroxysmal, s/p afib ablation 3/13 by Dr Rayann Heman  . GERD (gastroesophageal reflux disease)   . Pneumonia     hx of pneumonia  . H/O hiatal hernia   . Psychosocial stressors     chronic  . MRSA (methicillin resistant Staphylococcus aureus)     H/O flare ups  . Adenomatous polyps     tubular on colonoscopy 2/11  . OSA (obstructive sleep apnea)     Mild w AHI 6.57/hr now on 13cm H2O  . Obesity (BMI 30-39.9)     Past Surgical History  Procedure Laterality Date  . Cti ablation  7/10    for atrial flutter by Dr Rayann Heman  . Hernia repair    . Cholecystectomy    . Tee without cardioversion  09/12/2011    Procedure: TRANSESOPHAGEAL ECHOCARDIOGRAM (TEE);  Surgeon: Jettie Booze, MD;  Location: [facility] ENDOSCOPY;  Service: Cardiovascular;  Laterality: N/A;  . Atrial fibrillation ablation  09/13/11    PVI by Dr Rayann Heman  . Colonoscopy  08/2009  . Atrial fibrillation ablation N/A 09/13/2011    Procedure: ATRIAL FIBRILLATION ABLATION;  Surgeon: Thompson Grayer, MD;  Location: [facility] CATH LAB;  Service: Cardiovascular;  Laterality: N/A;    Current Outpatient Prescriptions  Medication Sig Dispense Refill  . diltiazem (CARTIA XT) 240 MG 24 hr capsule Take 1 capsule (240 mg total) by mouth daily. 30 capsule 2  . L-Arginine 500 MG CAPS Take 1 capsule by mouth 2 (two) times daily.    Marland Kitchen omeprazole (PRILOSEC) 20 MG capsule Take 20 mg by mouth daily.     No current  facility-administered medications for this visit.    Allergies:   No Known Allergies  Social History:  The patient  reports that he has never smoked. He does not have any smokeless tobacco history on file. He reports that he drinks about 0.6 oz of alcohol per week. He reports that he does not use illicit drugs.   ROS:  Please see the history of present illness.   No syncope,  No CP, no SOB.   PHYSICAL EXAM: VS:  BP 150/78 mmHg  Pulse 57  Ht 5\' 11"  (1.803 m)  Wt 233 lb (105.688 kg)  BMI 32.51 kg/m2  SpO2 94% Well nourished, well developed, in no acute distress HEENT: normal Neck: no JVD Cardiac:  normal S1, S2; RRR; no murmur Lungs:  clear to auscultation bilaterally, no wheezing, rhonchi or rales Abd: soft, nontender, no hepatomegalyOverweight Ext: no edemaPulses distally normal Skin: warm and dry Neuro: no focal abnormalities noted  EKG:  Sinus bradycardia rate 57, vertical axis     ASSESSMENT AND PLAN:  1. Atrial fibrillation-paroxysmal-following ablation. Doing very well. Brief episode of palpitations. No prolonged episodes. Continue with diltiazem. Okay to take flecainide pill in the pocket approach we will prescribe 100 mg dosing. He can take 200 mg if necessary. No anticoagulation given her risk score of 1. Prior atrial fibrillation clinic visits. 2. Obstructive sleep apnea-continue with treatment. Dr. Radford Pax. Expressed to him how treatment of sleep apnea is important in prevention of arrhythmias. 3. Obesity-continue with weight loss, exercise. This will  help overall with his atrial fibrillation. 4. FAA-we will revisit in March. 5. Elevated blood pressure-monitor. At previous visit normal. He did not take his Cardizem this morning. 6. 6 month follow up.   Signed, Candee Furbish, MD [facility]  03/31/2015 8:35 AM

## 2015-04-10 ENCOUNTER — Other Ambulatory Visit: Payer: Self-pay | Admitting: Cardiology

## 2015-06-20 DIAGNOSIS — H16213 Exposure keratoconjunctivitis, bilateral: Secondary | ICD-10-CM | POA: Diagnosis not present

## 2015-09-07 ENCOUNTER — Other Ambulatory Visit: Payer: Self-pay | Admitting: Gastroenterology

## 2015-09-07 DIAGNOSIS — Z86010 Personal history of colonic polyps: Secondary | ICD-10-CM | POA: Diagnosis not present

## 2015-09-07 DIAGNOSIS — K5730 Diverticulosis of large intestine without perforation or abscess without bleeding: Secondary | ICD-10-CM | POA: Diagnosis not present

## 2015-09-07 DIAGNOSIS — D123 Benign neoplasm of transverse colon: Secondary | ICD-10-CM | POA: Diagnosis not present

## 2015-09-07 DIAGNOSIS — D125 Benign neoplasm of sigmoid colon: Secondary | ICD-10-CM | POA: Diagnosis not present

## 2015-09-07 DIAGNOSIS — K621 Rectal polyp: Secondary | ICD-10-CM | POA: Diagnosis not present

## 2015-09-18 ENCOUNTER — Other Ambulatory Visit: Payer: Self-pay | Admitting: Physician Assistant

## 2015-09-18 ENCOUNTER — Ambulatory Visit (INDEPENDENT_AMBULATORY_CARE_PROVIDER_SITE_OTHER): Payer: Medicare Other | Admitting: Physician Assistant

## 2015-09-18 ENCOUNTER — Encounter: Payer: Self-pay | Admitting: Physician Assistant

## 2015-09-18 VITALS — BP 124/80 | HR 76 | Ht 71.0 in | Wt 228.0 lb

## 2015-09-18 DIAGNOSIS — I480 Paroxysmal atrial fibrillation: Secondary | ICD-10-CM

## 2015-09-18 DIAGNOSIS — I10 Essential (primary) hypertension: Secondary | ICD-10-CM | POA: Diagnosis not present

## 2015-09-18 DIAGNOSIS — I4891 Unspecified atrial fibrillation: Secondary | ICD-10-CM | POA: Diagnosis not present

## 2015-09-18 LAB — CBC WITH DIFFERENTIAL/PLATELET
BASOS ABS: 0.1 10*3/uL (ref 0.0–0.1)
Basophils Relative: 1 % (ref 0–1)
Eosinophils Absolute: 0.1 10*3/uL (ref 0.0–0.7)
Eosinophils Relative: 1 % (ref 0–5)
HEMATOCRIT: 48.1 % (ref 39.0–52.0)
Hemoglobin: 16.5 g/dL (ref 13.0–17.0)
Lymphocytes Relative: 28 % (ref 12–46)
Lymphs Abs: 1.7 10*3/uL (ref 0.7–4.0)
MCH: 32.3 pg (ref 26.0–34.0)
MCHC: 34.3 g/dL (ref 30.0–36.0)
MCV: 94.1 fL (ref 78.0–100.0)
MPV: 11.1 fL (ref 8.6–12.4)
Monocytes Absolute: 0.5 10*3/uL (ref 0.1–1.0)
Monocytes Relative: 9 % (ref 3–12)
Neutro Abs: 3.7 10*3/uL (ref 1.7–7.7)
Neutrophils Relative %: 61 % (ref 43–77)
PLATELETS: 179 10*3/uL (ref 150–400)
RBC: 5.11 MIL/uL (ref 4.22–5.81)
RDW: 13.7 % (ref 11.5–15.5)
WBC: 6.1 10*3/uL (ref 4.0–10.5)

## 2015-09-18 LAB — BASIC METABOLIC PANEL
BUN: 14 mg/dL (ref 7–25)
CO2: 25 mmol/L (ref 20–31)
Calcium: 8.9 mg/dL (ref 8.6–10.3)
Chloride: 107 mmol/L (ref 98–110)
Creat: 1.02 mg/dL (ref 0.70–1.25)
Glucose, Bld: 114 mg/dL — ABNORMAL HIGH (ref 65–99)
POTASSIUM: 4.4 mmol/L (ref 3.5–5.3)
Sodium: 141 mmol/L (ref 135–146)

## 2015-09-18 MED ORDER — RIVAROXABAN 20 MG PO TABS: 20.0000 mg | ORAL_TABLET | Freq: Every day | ORAL | Status: DC

## 2015-09-18 NOTE — Assessment & Plan Note (Addendum)
Patient says he has no history of hypertension. He is on diltiazem for heart rate control.

## 2015-09-18 NOTE — Assessment & Plan Note (Signed)
Patient has had recurrent atrial fibrillation that has not converted with flecainide. I discussed with Dr. Rayann Heman who recommends Xarelto 20 mg daily, no more flecainide and schedule TEE guided cardioversion this Thursday 09/21/15. We'll check labs today. Patient is agreeable. If he converts before then he is to call us and come in for an EKG and will cancel cardioversion. He can tell when he converts.     [facility] has been requested to perform a transesophageal echocardiogram on 09/21/15 for atrial fibrillation.  After careful review of history and examination, the risks and benefits of transesophageal echocardiogram have been explained including risks of esophageal damage, perforation (1:10,000 risk), bleeding, pharyngeal hematoma as well as other potential complications associated with conscious sedation including aspiration, arrhythmia, respiratory failure and death. Alternatives to treatment were discussed, questions were answered. Patient is willing to proceed.   Carlos Thomas,  09/18/2015 1:45 PM

## 2015-09-18 NOTE — Patient Instructions (Addendum)
Medication Instructions:  Your physician has recommended you make the following change in your medication:  1) STOP Flecainide  2) START Xarelto 20mg  daily. An Rx has been sent to your pharmacy   Labwork: Bmet, Cbc today  Testing/Procedures: Your physician has requested that you have a TEE/Cardioversion. During a TEE, sound waves are used to create images of your heart. It provides your doctor with information about the size and shape of your heart and how well your heart's chambers and valves are working. In this test, a transducer is attached to the end of a flexible tube that is guided down you throat and into your esophagus (the tube leading from your mouth to your stomach) to get a more detailed image of your heart. Once the TEE has determined that a blood clot is not present, the cardioversion begins. Electrical Cardioversion uses a jolt of electricity to your heart either through paddles or wired patches attached to your chest. This is a controlled, usually prescheduled, procedure. This procedure is done at the hospital and you are not awake during the procedure. You usually go home the day of the procedure. Please see the instruction sheet given to you today for more information.    Follow-Up: Your physician recommends that you schedule a follow-up appointment in: 2-3 weeks with Dr.Allred   Any Other Special Instructions Will Be Listed Below (If Applicable). Please call the office if you feel that you have converted back to a normal heart rhythm, we will need to obtain a EKG     If you need a refill on your cardiac medications before your next appointment, please call your pharmacy.

## 2015-09-18 NOTE — Progress Notes (Signed)
Cardiology Office Note   Date:  09/18/2015   ID:  Carlos, Thomas [birth year], MRN [id number]  PCP:  Shirline Frees, MD  Cardiologist: Dr. Marlou Porch EPS Dr. Rayann Heman OSA Dr. Radford Pax Chief Complaint: Palpitations    History of Present Illness: Carlos Thomas is a 65 y.o. male who presents as a walk-in for palpitations. He has history of paroxysmal atrial fibrillation status post ablation by Dr. Rayann Heman in 2010 and 2013. He had an episode of atrial fibrillation with upper respiratory infection 05/2014. Sinus rhythm was restored with flecainide and was placed back on Xarelto. He was weaned off his flecainide by Dr. Rayann Heman in 07/2014 and had not had any return of atrial fibrillation.CHADSVASC=1 so Xarelto was stopped. He last saw Dr. Marlou Porch 03/31/15 and which time he said he has occasional palpitations for which he takes a flecainide and it resolves. Patient also has hypertension, OSA on C Pap treated by Dr. 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 About 6pm he felt himself going into afib. He went home and took an extra Cardizem and one Flecainide. He converted about 2 hrs later because he had frequent urination. Sunday am BP 124/81 and pulse 80 and regular.Also had emotion trigger with problem with ex-wife. Got up this am and felt it go back to afib. He took a cardizem and flecainide 6:30 this am. He took a second flecainide at 10:00 am today. Patient feels poorly when he is in atrial fibrillation.    Past Medical History  Diagnosis Date  . HTN (hypertension)   . Atrial flutter [facility])     s/p CTI ablation 2010 by Dr Rayann Heman  . Atrial fibrillation (HCC)     paroxysmal, s/p afib ablation 3/13 by Dr Rayann Heman  . GERD (gastroesophageal reflux disease)   . Pneumonia     hx of pneumonia  . H/O hiatal hernia   . Psychosocial stressors     chronic  . MRSA (methicillin resistant Staphylococcus aureus)     H/O flare ups  . Adenomatous polyps    tubular on colonoscopy 2/11  . OSA (obstructive sleep apnea)     Mild w AHI 6.57/hr now on 13cm H2O  . Obesity (BMI 30-39.9)     Past Surgical History  Procedure Laterality Date  . Cti ablation  7/10    for atrial flutter by Dr Rayann Heman  . Hernia repair    . Cholecystectomy    . Tee without cardioversion  09/12/2011    Procedure: TRANSESOPHAGEAL ECHOCARDIOGRAM (TEE);  Surgeon: Jettie Booze, MD;  Location: [facility] ENDOSCOPY;  Service: Cardiovascular;  Laterality: N/A;  . Atrial fibrillation ablation  09/13/11    PVI by Dr Rayann Heman  . Colonoscopy  08/2009  . Atrial fibrillation ablation N/A 09/13/2011    Procedure: ATRIAL FIBRILLATION ABLATION;  Surgeon: Thompson Grayer, MD;  Location: [facility] CATH LAB;  Service: Cardiovascular;  Laterality: N/A;     Current Outpatient Prescriptions  Medication Sig Dispense Refill  . diltiazem (CARTIA XT) 240 MG 24 hr capsule Take 1 capsule (240 mg total) by mouth daily. 30 capsule 11  . flecainide (TAMBOCOR) 100 MG tablet Take 1 tablet (100 mg total) by mouth as needed. 1 tablet by mouth twice daily 60 tablet 11  . L-Arginine 500 MG CAPS Take 1 capsule by mouth 2 (two) times daily.    Marland Kitchen omeprazole (PRILOSEC) 20 MG capsule Take 20 mg by mouth daily.     No current  facility-administered medications for this visit.    Allergies:   Review of patient's allergies indicates no known allergies.    Social History:  The patient  reports that he has never smoked. He does not have any smokeless tobacco history on file. He reports that he drinks about 0.6 oz of alcohol per week. He reports that he does not use illicit drugs.   Family History:  The patient's family history includes CAD in his father; Heart attack in his father. There is no history of Atrial fibrillation or Anesthesia problems.    ROS:  Please see the history of present illness.   Otherwise, review of systems are positive for back pain.   All other systems are reviewed and negative.    PHYSICAL  EXAM: VS:  BP 124/80 mmHg  Pulse 76  Ht 5\' 11"  (1.803 m)  Wt 228 lb (103.42 kg)  BMI 31.81 kg/m2 , BMI Body mass index is 31.81 kg/(m^2). GEN: Well nourished, well developed, in no acute distress Neck: no JVD, HJR, carotid bruits, or masses Cardiac:  Irregular irregular; no murmurs,gallop, rubs, thrill or heave,  Respiratory:  clear to auscultation bilaterally, normal work of breathing GI: soft, nontender, nondistended, + BS MS: no deformity or atrophy Extremities: without cyanosis, clubbing, edema, good distal pulses bilaterally.  Skin: warm and dry, no rash Neuro:  Strength and sensation are intact    EKG:  EKG is ordered today. The ekg ordered today demonstrates coarse atrial fib at 98 bpm   Recent Labs: No results found for requested labs within last 365 days.    Lipid Panel No results found for: CHOL, TRIG, HDL, CHOLHDL, VLDL, LDLCALC, LDLDIRECT    Wt Readings from Last 3 Encounters:  09/18/15 228 lb (103.42 kg)  03/31/15 233 lb (105.688 kg)  12/01/14 232 lb 12.8 oz (105.597 kg)      Other studies Reviewed: Additional studies/ records that were reviewed today include and review of the records demonstrates:  2-D echo 06/03/14 Study Conclusions  - Left ventricle: The cavity size was normal. Systolic function was   normal. The estimated ejection fraction was in the range of 55%   to 60%. Wall motion was normal; there were no regional wall   motion abnormalities. - Mitral valve: There was trivial regurgitation. - Right ventricle: The cavity size was mildly dilated. Wall   thickness was normal. - Tricuspid valve: There was trivial regurgitation   ASSESSMENT AND PLAN: PAF (paroxysmal atrial fibrillation) Patient has had recurrent atrial fibrillation that has not converted with flecainide. I discussed with Dr. Rayann Heman who recommends Xarelto 20 mg daily, no more flecainide and schedule TEE guided cardioversion this Thursday 09/21/15. We'll check labs today. Patient is  agreeable. If he converts before then he is to call us and come in for an EKG and will cancel cardioversion. He can tell when he converts.     [facility] has been requested to perform a transesophageal echocardiogram on 09/21/15 for atrial fibrillation.  After careful review of history and examination, the risks and benefits of transesophageal echocardiogram have been explained including risks of esophageal damage, perforation (1:10,000 risk), bleeding, pharyngeal hematoma as well as other potential complications associated with conscious sedation including aspiration, arrhythmia, respiratory failure and death. Alternatives to treatment were discussed, questions were answered. Patient is willing to proceed.   Ermalinda Barrios,  09/18/2015 1:45 PM    Essential hypertension Patient says he has no history of hypertension. He is on diltiazem for heart rate control.  Signed, Ermalinda Barrios, PA-C  09/18/2015 1:29 PM    [facility] [address], [place], [place]  [address] Phone: [phone]; Fax: [phone]

## 2015-09-21 ENCOUNTER — Ambulatory Visit (HOSPITAL_COMMUNITY)
Admission: RE | Admit: 2015-09-21 | Discharge: 2015-09-21 | Disposition: A | Discharge status: Home / Self Care | Payer: Medicare Other | Source: Ambulatory Visit | Attending: Cardiology | Admitting: Cardiology

## 2015-09-21 ENCOUNTER — Ambulatory Visit (HOSPITAL_COMMUNITY): Payer: Medicare Other | Admitting: Anesthesiology

## 2015-09-21 ENCOUNTER — Encounter (HOSPITAL_COMMUNITY)
Admission: RE | Disposition: A | Discharge status: Home / Self Care | Payer: Self-pay | Source: Ambulatory Visit | Attending: Cardiology

## 2015-09-21 ENCOUNTER — Encounter (HOSPITAL_COMMUNITY): Payer: Self-pay | Admitting: *Deleted

## 2015-09-21 ENCOUNTER — Ambulatory Visit (HOSPITAL_BASED_OUTPATIENT_CLINIC_OR_DEPARTMENT_OTHER): Payer: Medicare Other

## 2015-09-21 DIAGNOSIS — E669 Obesity, unspecified: Secondary | ICD-10-CM | POA: Diagnosis not present

## 2015-09-21 DIAGNOSIS — Z8614 Personal history of Methicillin resistant Staphylococcus aureus infection: Secondary | ICD-10-CM | POA: Insufficient documentation

## 2015-09-21 DIAGNOSIS — I480 Paroxysmal atrial fibrillation: Secondary | ICD-10-CM | POA: Insufficient documentation

## 2015-09-21 DIAGNOSIS — I10 Essential (primary) hypertension: Secondary | ICD-10-CM | POA: Insufficient documentation

## 2015-09-21 DIAGNOSIS — G4733 Obstructive sleep apnea (adult) (pediatric): Secondary | ICD-10-CM | POA: Insufficient documentation

## 2015-09-21 DIAGNOSIS — I4891 Unspecified atrial fibrillation: Secondary | ICD-10-CM | POA: Diagnosis not present

## 2015-09-21 DIAGNOSIS — Z6831 Body mass index (BMI) 31.0-31.9, adult: Secondary | ICD-10-CM | POA: Insufficient documentation

## 2015-09-21 HISTORY — PX: CARDIOVERSION: SHX1299

## 2015-09-21 HISTORY — PX: TEE WITHOUT CARDIOVERSION: SHX5443

## 2015-09-21 SURGERY — ECHOCARDIOGRAM, TRANSESOPHAGEAL
Anesthesia: Monitor Anesthesia Care

## 2015-09-21 MED ORDER — PROPOFOL 10 MG/ML IV BOLUS
INTRAVENOUS | Status: DC | PRN
  Administered 2015-09-21 (×2): 20 mg via INTRAVENOUS

## 2015-09-21 MED ORDER — SODIUM CHLORIDE 0.9 % IV SOLN: INTRAVENOUS | Status: DC

## 2015-09-21 MED ORDER — LIDOCAINE HCL (CARDIAC) 20 MG/ML IV SOLN
INTRAVENOUS | Status: DC | PRN
  Administered 2015-09-21: 50 mg via INTRATRACHEAL

## 2015-09-21 MED ORDER — BUTAMBEN-TETRACAINE-BENZOCAINE 2-2-14 % EX AERO
INHALATION_SPRAY | CUTANEOUS | Status: DC | PRN
  Administered 2015-09-21: 2 via TOPICAL

## 2015-09-21 MED ORDER — PROPOFOL 500 MG/50ML IV EMUL
INTRAVENOUS | Status: DC | PRN
  Administered 2015-09-21: 75 ug/kg/min via INTRAVENOUS

## 2015-09-21 NOTE — CV Procedure (Signed)
See full TEE report in [employer]; patient sedated by anesthesia with diprovan 300 mg IV total; normal LV function; moderate LAE; no LAA thrombus; synchronized DCCV with 120 J resulted in sinus rhythm. No immediate compliactions. Continue xarelto. Kirk Ruths

## 2015-09-21 NOTE — Discharge Instructions (Signed)
Transesophageal Echocardiogram °Transesophageal echocardiography (TEE) is a picture test of your heart using sound waves. The pictures taken can give very detailed pictures of your heart. This can help your doctor see if there are problems with your heart. TEE can check: °· If your heart has blood clots in it. °· How well your heart valves are working. °· If you have an infection on the inside of your heart. °· Some of the major arteries of your heart. °· If your heart valve is working after a repair. °· Your heart before a procedure that uses a shock to your heart to get the rhythm back to normal. °BEFORE THE PROCEDURE °· Do not eat or drink for 6 hours before the procedure or as told by your doctor. °· Make plans to have someone drive you home after the procedure. Do not drive yourself home. °· An IV tube will be put in your arm. °PROCEDURE °· You will be given a medicine to help you relax (sedative). It will be given through the IV tube. °· A numbing medicine will be sprayed or gargled in the back of your throat to help numb it. °· The tip of the probe is placed into the back of your mouth. You will be asked to swallow. This helps to pass the probe into your esophagus. °· Once the tip of the probe is in the right place, your doctor can take pictures of your heart. °· You may feel pressure at the back of your throat. °AFTER THE PROCEDURE °· You will be taken to a recovery area so the sedative can wear off. °· Your throat may be sore and scratchy. This will go away slowly over time. °· You will go home when you are fully awake and able to swallow liquids. °· You should have someone stay with you for the next 24 hours. °· Do not drive or operate machinery for the next 24 hours. °  °This information is not intended to replace advice given to you by your health care provider. Make sure you discuss any questions you have with your health care provider. °  °Document Released: 04/14/2009 Document Revised: 06/22/2013  Document Reviewed: 12/17/2012 °Elsevier Interactive Patient Education ©2016 [employer] °Electrical Cardioversion, Care After °Refer to this sheet in the next few weeks. These instructions provide you with information on caring for yourself after your procedure. Your health care provider may also give you more specific instructions. Your treatment has been planned according to current medical practices, but problems sometimes occur. Call your health care provider if you have any problems or questions after your procedure. °WHAT TO EXPECT AFTER THE PROCEDURE °After your procedure, it is typical to have the following sensations: °· Some redness on the skin where the shocks were delivered. If this is tender, a sunburn lotion or hydrocortisone cream may help. °· Possible return of an abnormal heart rhythm within hours or days after the procedure. °HOME CARE INSTRUCTIONS °· Take medicines only as directed by your health care provider. Be sure you understand how and when to take your medicine. °· Learn how to feel your pulse and check it often. °· Limit your activity for 48 hours after the procedure or as directed by your health care provider. °· Avoid or minimize caffeine and other stimulants as directed by your health care provider. °SEEK MEDICAL CARE IF: °· You feel like your heart is beating too fast or your pulse is not regular. °· You have any questions about your medicines. °·   You have bleeding that will not stop. °SEEK IMMEDIATE MEDICAL CARE IF: °· You are dizzy or feel faint. °· It is hard to breathe or you feel short of breath. °· There is a change in discomfort in your chest. °· Your speech is slurred or you have trouble moving an arm or leg on one side of your body. °· You get a serious muscle cramp that does not go away. °· Your fingers or toes turn cold or blue. °  °This information is not intended to replace advice given to you by your health care provider. Make sure you discuss any questions you have  with your health care provider. °  °Document Released: 04/07/2013 Document Revised: 07/08/2014 Document Reviewed: 04/07/2013 °Elsevier Interactive Patient Education ©2016 Elsevier Inc. ° °

## 2015-09-21 NOTE — Anesthesia Preprocedure Evaluation (Addendum)
Anesthesia Evaluation  Patient identified by MRN, date of birth, ID band Patient awake    Reviewed: Allergy & Precautions, H&P , NPO status , Patient's Chart, lab work & pertinent test results  Airway Mallampati: II  TM Distance: >3 FB Neck ROM: Full    Dental  (+) Dental Advisory Given, Teeth Intact   Pulmonary sleep apnea ,  Pneumonia 2004   Pulmonary exam normal        Cardiovascular hypertension, Pt. on medications + dysrhythmias Atrial Fibrillation  Rhythm:irregular Rate:Tachycardia  Echo is 55%, no sig valvular abnormalities   Neuro/Psych    GI/Hepatic hiatal hernia, GERD  Medicated and Controlled,  Endo/Other    Renal/GU      Musculoskeletal   Abdominal (+) + obese,   Peds  Hematology   Anesthesia Other Findings   Reproductive/Obstetrics                           Anesthesia Physical  Anesthesia Plan  ASA: II  Anesthesia Plan: MAC   Post-op Pain Management:    Induction: Intravenous  Airway Management Planned: Nasal Cannula  Additional Equipment:   Intra-op Plan:   Post-operative Plan:   Informed Consent: I have reviewed the patients History and Physical, chart, labs and discussed the procedure including the risks, benefits and alternatives for the proposed anesthesia with the patient or authorized representative who has indicated his/her understanding and acceptance.   Dental advisory given  Plan Discussed with: CRNA  Anesthesia Plan Comments: (Propofol mac)       Anesthesia Quick Evaluation

## 2015-09-21 NOTE — Anesthesia Procedure Notes (Signed)
Procedure Name: MAC Date/Time: 09/21/2015 9:58 AM Performed by: Maryland Pink Pre-anesthesia Checklist: Patient identified, Emergency Drugs available, Suction available, Patient being monitored and Timeout performed Patient Re-evaluated:Patient Re-evaluated prior to inductionOxygen Delivery Method: Nasal cannula Intubation Type: IV induction Placement Confirmation: positive ETCO2 Dental Injury: Teeth and Oropharynx as per pre-operative assessment

## 2015-09-21 NOTE — H&P (Signed)
Leja  09/18/2015 12:15 PM  Office Visit  MRN:  [id number]   Description: Male DOB: [birth year]  Provider: Imogene Burn, PA-C  Department: [address] [facility]       Vital Signs  Most recent update: 09/18/2015 1:12 PM by Anselm Pancoast, CMA    BP Pulse Ht Wt BMI    124/80 mmHg 76 5\' 11"  (1.803 m) 228 lb (103.42 kg) 31.81 kg/m2    Vitals History     Progress Notes      Imogene Burn, PA-C at 09/18/2015 12:41 PM     Status: Signed       Expand All Collapse All   Cardiology Office Note   Date: 09/18/2015   ID: RAKSHAN BEDINGER, DOB [birth year], MRN [id number]  PCP: Shirline Frees, MD Cardiologist: Dr. Marlou Porch EPS Dr. Rayann Heman OSA Dr. Radford Pax Chief Complaint: Palpitations   History of Present Illness: JEROLD CONSTANTINIDES is a 65 y.o. male who presents as a walk-in for palpitations. He has history of paroxysmal atrial fibrillation status post ablation by Dr. Rayann Heman in 2010 and 2013. He had an episode of atrial fibrillation with upper respiratory infection 05/2014. Sinus rhythm was restored with flecainide and was placed back on Xarelto. He was weaned off his flecainide by Dr. Rayann Heman in 07/2014 and had not had any return of atrial fibrillation.CHADSVASC=1 so Xarelto was stopped. He last saw Dr. Marlou Porch 03/31/15 and which time he said he has occasional palpitations for which he takes a flecainide and it resolves. Patient also has hypertension, OSA on C Pap treated by Dr. 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 About 6pm he felt himself going into afib. He went home and took an extra Cardizem and one Flecainide. He converted about 2 hrs later because he had frequent urination. Sunday am BP 124/81 and pulse 80 and regular.Also had emotion trigger with problem with ex-wife. Got up this am and felt it go back to afib. He took a cardizem and flecainide 6:30 this am. He took a second flecainide at 10:00 am today. Patient  feels poorly when he is in atrial fibrillation.    Past Medical History  Diagnosis Date  . HTN (hypertension)   . Atrial flutter [facility])     s/p CTI ablation 2010 by Dr Rayann Heman  . Atrial fibrillation (HCC)     paroxysmal, s/p afib ablation 3/13 by Dr Rayann Heman  . GERD (gastroesophageal reflux disease)   . Pneumonia     hx of pneumonia  . H/O hiatal hernia   . Psychosocial stressors     chronic  . MRSA (methicillin resistant Staphylococcus aureus)     H/O flare ups  . Adenomatous polyps     tubular on colonoscopy 2/11  . OSA (obstructive sleep apnea)     Mild w AHI 6.57/hr now on 13cm H2O  . Obesity (BMI 30-39.9)     Past Surgical History  Procedure Laterality Date  . Cti ablation  7/10    for atrial flutter by Dr Rayann Heman  . Hernia repair    . Cholecystectomy    . Tee without cardioversion  09/12/2011    Procedure: TRANSESOPHAGEAL ECHOCARDIOGRAM (TEE); Surgeon: Jettie Booze, MD; Location: [facility] ENDOSCOPY; Service: Cardiovascular; Laterality: N/A;  . Atrial fibrillation ablation  09/13/11    PVI by Dr Rayann Heman  . Colonoscopy  08/2009  . Atrial fibrillation ablation N/A 09/13/2011    Procedure: ATRIAL FIBRILLATION ABLATION; Surgeon: Thompson Grayer, MD;  Location: [facility] CATH LAB; Service: Cardiovascular; Laterality: N/A;     Current Outpatient Prescriptions  Medication Sig Dispense Refill  . diltiazem (CARTIA XT) 240 MG 24 hr capsule Take 1 capsule (240 mg total) by mouth daily. 30 capsule 11  . flecainide (TAMBOCOR) 100 MG tablet Take 1 tablet (100 mg total) by mouth as needed. 1 tablet by mouth twice daily 60 tablet 11  . L-Arginine 500 MG CAPS Take 1 capsule by mouth 2 (two) times daily.    Marland Kitchen omeprazole (PRILOSEC) 20 MG capsule Take 20 mg by mouth daily.     No current facility-administered medications for this visit.    Allergies:  Review of patient's allergies indicates no known allergies.    Social History: The patient  reports that he has never smoked. He does not have any smokeless tobacco history on file. He reports that he drinks about 0.6 oz of alcohol per week. He reports that he does not use illicit drugs.   Family History: The patient's family history includes CAD in his father; Heart attack in his father. There is no history of Atrial fibrillation or Anesthesia problems.    ROS: Please see the history of present illness. Otherwise, review of systems are positive for back pain. All other systems are reviewed and negative.    PHYSICAL EXAM: VS: BP 124/80 mmHg  Pulse 76  Ht 5\' 11"  (1.803 m)  Wt 228 lb (103.42 kg)  BMI 31.81 kg/m2 , BMI Body mass index is 31.81 kg/(m^2). GEN: Well nourished, well developed, in no acute distress  Neck: no JVD, HJR, carotid bruits, or masses Cardiac: Irregular irregular; no murmurs,gallop, rubs, thrill or heave,  Respiratory: clear to auscultation bilaterally, normal work of breathing GI: soft, nontender, nondistended, + BS MS: no deformity or atrophy  Extremities: without cyanosis, clubbing, edema, good distal pulses bilaterally.  Skin: warm and dry, no rash Neuro: Strength and sensation are intact    EKG: EKG is ordered today. The ekg ordered today demonstrates coarse atrial fib at 98 bpm   Recent Labs: No results found for requested labs within last 365 days.    Lipid Panel  Labs (Brief)    No results found for: CHOL, TRIG, HDL, CHOLHDL, VLDL, LDLCALC, LDLDIRECT     Wt Readings from Last 3 Encounters:  09/18/15 228 lb (103.42 kg)  03/31/15 233 lb (105.688 kg)  12/01/14 232 lb 12.8 oz (105.597 kg)      Other studies Reviewed: Additional studies/ records that were reviewed today include and review of the records demonstrates:  2-D echo 06/03/14 Study Conclusions  - Left ventricle: The cavity size was normal. Systolic function  was normal. The estimated ejection fraction was in the range of 55% to 60%. Wall motion was normal; there were no regional wall motion abnormalities. - Mitral valve: There was trivial regurgitation. - Right ventricle: The cavity size was mildly dilated. Wall thickness was normal. - Tricuspid valve: There was trivial regurgitation   ASSESSMENT AND PLAN: PAF (paroxysmal atrial fibrillation) Patient has had recurrent atrial fibrillation that has not converted with flecainide. I discussed with Dr. Rayann Heman who recommends Xarelto 20 mg daily, no more flecainide and schedule TEE guided cardioversion this Thursday 09/21/15. We'll check labs today. Patient is agreeable. If he converts before then he is to call us and come in for an EKG and will cancel cardioversion. He can tell when he converts.     [facility] has been requested to perform a transesophageal echocardiogram on 09/21/15 for atrial  fibrillation. After careful review of history and examination, the risks and benefits of transesophageal echocardiogram have been explained including risks of esophageal damage, perforation (1:10,000 risk), bleeding, pharyngeal hematoma as well as other potential complications associated with conscious sedation including aspiration, arrhythmia, respiratory failure and death. Alternatives to treatment were discussed, questions were answered. Patient is willing to proceed.   Ermalinda Barrios,  09/18/2015 1:45 PM    Essential hypertension Patient says he has no history of hypertension. He is on diltiazem for heart rate control.      Signed, Ermalinda Barrios, PA-C  09/18/2015 1:29 PM  [facility] [place], [place], [place] [address] Phone: [phone]; Fax: [phone]                PAF (paroxysmal atrial fibrillation) - Imogene Burn, PA-C at 09/18/2015 1:46 PM     Status: Written Related Problem: PAF (paroxysmal atrial fibrillation)   Expand  All Collapse All   Patient has had recurrent atrial fibrillation that has not converted with flecainide. I discussed with Dr. Rayann Heman who recommends Xarelto 20 mg daily, no more flecainide and schedule TEE guided cardioversion this Thursday 09/21/15. We'll check labs today. Patient is agreeable. If he converts before then he is to call us and come in for an EKG and will cancel cardioversion. He can tell when he converts.     [facility] has been requested to perform a transesophageal echocardiogram on 09/21/15 for atrial fibrillation. After careful review of history and examination, the risks and benefits of transesophageal echocardiogram have been explained including risks of esophageal damage, perforation (1:10,000 risk), bleeding, pharyngeal hematoma as well as other potential complications associated with conscious sedation including aspiration, arrhythmia, respiratory failure and death. Alternatives to treatment were discussed, questions were answered. Patient is willing to proceed.   Ermalinda Barrios,  09/18/2015 1:45 PM              Essential hypertension - Imogene Burn, PA-C at 09/18/2015 1:46 PM     Status: Alison Stalling Related Problem: Essential hypertension   Expand All Collapse All   Patient says he has no history of hypertension. He is on diltiazem for heart rate control.       For TEE/DCCV; no changes Kirk Ruths

## 2015-09-21 NOTE — Anesthesia Postprocedure Evaluation (Signed)
Anesthesia Post Note  Patient: Carlos Thomas  Procedure(s) Performed: Procedure(s) (LRB): TRANSESOPHAGEAL ECHOCARDIOGRAM (TEE) (N/A) CARDIOVERSION (N/A)  Patient location during evaluation: Endoscopy Anesthesia Type: MAC Level of consciousness: awake and alert and oriented Pain management: pain level controlled Vital Signs Assessment: post-procedure vital signs reviewed and stable Respiratory status: spontaneous breathing Cardiovascular status: blood pressure returned to baseline and stable Anesthetic complications: no    Last Vitals:  Filed Vitals:   09/21/15 0925 09/21/15 1021  BP:    Temp: 36.6 C   Resp:  19    Last Pain: There were no vitals filed for this visit.               Maryland Pink

## 2015-09-21 NOTE — Transfer of Care (Signed)
Immediate Anesthesia Transfer of Care Note  Patient: Carlos Thomas  Procedure(s) Performed: Procedure(s): TRANSESOPHAGEAL ECHOCARDIOGRAM (TEE) (N/A) CARDIOVERSION (N/A)  Patient Location: Endoscopy Unit  Anesthesia Type:MAC  Level of Consciousness: awake, alert  and oriented  Airway & Oxygen Therapy: Patient Spontanous Breathing  Post-op Assessment: Report given to RN and Post -op Vital signs reviewed and stable  Post vital signs: Reviewed and stable  Last Vitals:  Filed Vitals:   09/21/15 0925 09/21/15 1021  BP:    Temp: 36.6 C   Resp:  19    Complications: No apparent anesthesia complications

## 2015-09-28 ENCOUNTER — Telehealth: Payer: Self-pay | Admitting: Internal Medicine

## 2015-09-28 NOTE — Telephone Encounter (Signed)
Pt called due to recently having a DCCV , had a rx from [facility] for Flecinide, started taking it Sat night-1 tab bid due to feeling pressure going up and going into Afib, feels better now after a couple days of taking it, wants to know if okay to continue--also he has a fu with [facility]red 10-06-15

## 2015-09-28 NOTE — Telephone Encounter (Signed)
Discussed with Chanetta Marshall, NP and she says he can continue taking the Flecainide 100 mg bid until his follow up with Dr Rayann Heman next week.  He is still taking the Diltiazem also.

## 2015-10-02 ENCOUNTER — Ambulatory Visit: Payer: Medicare Other | Admitting: Cardiology

## 2015-10-06 ENCOUNTER — Encounter: Payer: Self-pay | Admitting: Internal Medicine

## 2015-10-06 ENCOUNTER — Ambulatory Visit (INDEPENDENT_AMBULATORY_CARE_PROVIDER_SITE_OTHER): Payer: Medicare Other | Admitting: Internal Medicine

## 2015-10-06 VITALS — BP 140/64 | HR 51 | Ht 71.0 in | Wt 233.2 lb

## 2015-10-06 DIAGNOSIS — I480 Paroxysmal atrial fibrillation: Secondary | ICD-10-CM

## 2015-10-06 DIAGNOSIS — E669 Obesity, unspecified: Secondary | ICD-10-CM | POA: Diagnosis not present

## 2015-10-06 DIAGNOSIS — G4733 Obstructive sleep apnea (adult) (pediatric): Secondary | ICD-10-CM

## 2015-10-06 DIAGNOSIS — I10 Essential (primary) hypertension: Secondary | ICD-10-CM | POA: Diagnosis not present

## 2015-10-06 MED ORDER — FLECAINIDE ACETATE 100 MG PO TABS: 100.0000 mg | ORAL_TABLET | Freq: Two times a day (BID) | ORAL | Status: DC

## 2015-10-06 NOTE — Patient Instructions (Signed)
Medication Instructions:  Your physician recommends that you continue on your current medications as directed. Please refer to the Current Medication list given to you today.   Labwork: None ordered   Testing/Procedures: None ordered   Follow-Up:  Your physician recommends that you schedule a follow-up appointment in: 3 months with Donna Carroll, NP   Any Other Special Instructions Will Be Listed Below (If Applicable).     If you need a refill on your cardiac medications before your next appointment, please call your pharmacy.   

## 2015-10-07 NOTE — Progress Notes (Signed)
Electrophysiology Office Note   Date:  10/07/2015   ID:  LJ SLIMAN, DOB [birth year], MRN [id number]  PCP:  Shirline Frees, MD  Cardiologist: Dr Marlou Porch Primary Electrophysiologist: Thompson Grayer, MD    Chief Complaint  Patient presents with  . Follow-up     History of Present Illness: Carlos Thomas is a 65 y.o. male who presents today for electrophysiology evaluation.  Recently with recurrence of atrial fibrillation after "over working" in the yard.  He was cardioverted and is now doing "much better">  He is pleased with his current state.  He takes flecanide 100mg  BID.  Today, he denies symptoms of palpitations, chest pain, shortness of breath, orthopnea, PND, lower extremity edema, claudication, dizziness, presyncope, syncope, bleeding, or neurologic sequela. The patient is tolerating medications without difficulties and is otherwise without complaint today.    Past Medical History  Diagnosis Date  . HTN (hypertension)   . Atrial flutter [facility])     s/p CTI ablation 2010 by Dr Rayann Heman  . Atrial fibrillation (HCC)     paroxysmal, s/p afib ablation 3/13 by Dr Rayann Heman  . GERD (gastroesophageal reflux disease)   . Pneumonia     hx of pneumonia  . H/O hiatal hernia   . Psychosocial stressors     chronic  . MRSA (methicillin resistant Staphylococcus aureus)     H/O flare ups  . Adenomatous polyps     tubular on colonoscopy 2/11  . OSA (obstructive sleep apnea)     Mild w AHI 6.57/hr now on 13cm H2O  . Obesity (BMI 30-39.9)    Past Surgical History  Procedure Laterality Date  . Cti ablation  7/10    for atrial flutter by Dr Rayann Heman  . Hernia repair    . Cholecystectomy    . Tee without cardioversion  09/12/2011    Procedure: TRANSESOPHAGEAL ECHOCARDIOGRAM (TEE);  Surgeon: Jettie Booze, MD;  Location: [facility] ENDOSCOPY;  Service: Cardiovascular;  Laterality: N/A;  . Atrial fibrillation ablation  09/13/11    PVI by Dr Rayann Heman  . Colonoscopy  08/2009  . Atrial  fibrillation ablation N/A 09/13/2011    Procedure: ATRIAL FIBRILLATION ABLATION;  Surgeon: Thompson Grayer, MD;  Location: [facility] CATH LAB;  Service: Cardiovascular;  Laterality: N/A;  . Tee without cardioversion N/A 09/21/2015    Procedure: TRANSESOPHAGEAL ECHOCARDIOGRAM (TEE);  Surgeon: Lelon Perla, MD;  Location: [facility] ENDOSCOPY;  Service: Cardiovascular;  Laterality: N/A;  . Cardioversion N/A 09/21/2015    Procedure: CARDIOVERSION;  Surgeon: Lelon Perla, MD;  Location: [facility] ENDOSCOPY;  Service: Cardiovascular;  Laterality: N/A;     Current Outpatient Prescriptions  Medication Sig Dispense Refill  . diltiazem (CARTIA XT) 240 MG 24 hr capsule Take 1 capsule (240 mg total) by mouth daily. 30 capsule 11  . flecainide (TAMBOCOR) 100 MG tablet Take 1 tablet (100 mg total) by mouth 2 (two) times daily. 180 tablet 3  . L-Arginine 500 MG CAPS Take 1 capsule by mouth 2 (two) times daily.    Marland Kitchen omeprazole (PRILOSEC) 20 MG capsule Take 20 mg by mouth daily.    . rivaroxaban (XARELTO) 20 MG TABS tablet Take 1 tablet (20 mg total) by mouth daily with supper. 30 tablet 11   No current facility-administered medications for this visit.    Allergies:   Review of patient's allergies indicates no known allergies.   Social History:  The patient  reports that he has never smoked. He does not have any smokeless tobacco history on  file. He reports that he drinks about 0.6 oz of alcohol per week. He reports that he does not use illicit drugs.   Family History:  The patient's  family history includes CAD in his father; Heart attack in his father. There is no history of Atrial fibrillation or Anesthesia problems.    ROS:  Please see the history of present illness.   All other systems are reviewed and negative.    PHYSICAL EXAM: VS:  BP 140/64 mmHg  Pulse 51  Ht 5\' 11"  (1.803 m)  Wt 233 lb 3.2 oz (105.779 kg)  BMI 32.54 kg/m2 , BMI Body mass index is 32.54 kg/(m^2). GEN: Well nourished, well developed, in no  acute distress HEENT: normal Neck: no JVD, carotid bruits, or masses Cardiac: RRR; no murmurs, rubs, or gallops,no edema  Respiratory:  clear to auscultation bilaterally, normal work of breathing GI: soft, nontender, nondistended, + BS MS: no deformity or atrophy Skin: warm and dry  Neuro:  Strength and sensation are intact Psych: euthymic mood, full affect  EKG:  EKG is ordered today. The ekg ordered today shows sinus rhythm   Recent Labs: 09/18/2015: BUN 14; Creat 1.02; Hemoglobin 16.5; Platelets 179; Potassium 4.4; Sodium 141    Lipid Panel  No results found for: CHOL, TRIG, HDL, CHOLHDL, VLDL, LDLCALC, LDLDIRECT   Wt Readings from Last 3 Encounters:  10/06/15 233 lb 3.2 oz (105.779 kg)  09/18/15 228 lb (103.42 kg)  03/31/15 233 lb (105.688 kg)      Other studies Reviewed: Additional studies/ records that were reviewed today include: epic records, recent cardioversion   ASSESSMENT AND PLAN:  1.  Persistent atrial fibrillation Currently maintaining sinus rhythm on flecainide On xarelto If his afib burden increases, I would advise repeat ablation  2. Obesity Body mass index is 32.54 kg/(m^2).  Lifestyle modification is encouraged  3. OSA Compliance with CPAP encouraged  4. HTN Stable No change required today  Follow-up with Butch Penny in the AF clinic every 3 months I will see when needed  Current medicines are reviewed at length with the patient today.   The patient does not have concerns regarding his medicines.  The following changes were made today:  none  Labs/ tests ordered today include:  Orders Placed This Encounter  Procedures  . EKG 12-Lead     Signed, Thompson Grayer, MD    [place] [phone] (office) [phone] (fax)

## 2015-12-01 ENCOUNTER — Encounter: Payer: Self-pay | Admitting: Cardiology

## 2015-12-01 ENCOUNTER — Ambulatory Visit (INDEPENDENT_AMBULATORY_CARE_PROVIDER_SITE_OTHER): Payer: Medicare Other | Admitting: Cardiology

## 2015-12-01 VITALS — BP 146/80 | HR 58 | Ht 71.0 in | Wt 232.8 lb

## 2015-12-01 DIAGNOSIS — E669 Obesity, unspecified: Secondary | ICD-10-CM

## 2015-12-01 DIAGNOSIS — I10 Essential (primary) hypertension: Secondary | ICD-10-CM | POA: Diagnosis not present

## 2015-12-01 DIAGNOSIS — G4733 Obstructive sleep apnea (adult) (pediatric): Secondary | ICD-10-CM | POA: Diagnosis not present

## 2015-12-01 NOTE — Patient Instructions (Signed)

## 2015-12-01 NOTE — Progress Notes (Signed)
Cardiology Office Note    Date:  12/01/2015   ID:  Carlos Thomas, DOB 11-02-1950, MRN [id number]  PCP:  Shirline Frees, MD  Cardiologist:  Fransico Him, MD   Chief Complaint  Patient presents with  . Sleep Apnea  . Hypertension    History of Present Illness:  Carlos Thomas is a 65 y.o. male with a history of OSA and obesity who presents today for followup. He is doing well with his CPAP device. He tolerates his CPAP without any problems. He tolerates the full face mask and feels the pressure is adequate. He denies any nasal congestion or dryness but gets some mouth dryness at night . He feels rested in the am and has no significant daytime sleepiness. He does not think he snores at night. He does not get much aerobic exercise.    Past Medical History  Diagnosis Date  . HTN (hypertension)   . Atrial flutter [facility])     s/p CTI ablation 2010 by Dr Rayann Heman  . Atrial fibrillation (HCC)     paroxysmal, s/p afib ablation 3/13 by Dr Rayann Heman  . GERD (gastroesophageal reflux disease)   . Pneumonia     hx of pneumonia  . H/O hiatal hernia   . Psychosocial stressors     chronic  . MRSA (methicillin resistant Staphylococcus aureus)     H/O flare ups  . Adenomatous polyps     tubular on colonoscopy 2/11  . OSA (obstructive sleep apnea)     Mild w AHI 6.57/hr now on 13cm H2O  . Obesity (BMI 30-39.9)     Past Surgical History  Procedure Laterality Date  . Cti ablation  7/10    for atrial flutter by Dr Rayann Heman  . Hernia repair    . Cholecystectomy    . Tee without cardioversion  09/12/2011    Procedure: TRANSESOPHAGEAL ECHOCARDIOGRAM (TEE);  Surgeon: Jettie Booze, MD;  Location: [facility] ENDOSCOPY;  Service: Cardiovascular;  Laterality: N/A;  . Atrial fibrillation ablation  09/13/11    PVI by Dr Rayann Heman  . Colonoscopy  08/2009  . Atrial fibrillation ablation N/A 09/13/2011    Procedure: ATRIAL FIBRILLATION ABLATION;  Surgeon: Thompson Grayer, MD;  Location: [facility] CATH LAB;   Service: Cardiovascular;  Laterality: N/A;  . Tee without cardioversion N/A 09/21/2015    Procedure: TRANSESOPHAGEAL ECHOCARDIOGRAM (TEE);  Surgeon: Lelon Perla, MD;  Location: [facility] ENDOSCOPY;  Service: Cardiovascular;  Laterality: N/A;  . Cardioversion N/A 09/21/2015    Procedure: CARDIOVERSION;  Surgeon: Lelon Perla, MD;  Location: [facility] ENDOSCOPY;  Service: Cardiovascular;  Laterality: N/A;    Current Medications: Outpatient Prescriptions Prior to Visit  Medication Sig Dispense Refill  . diltiazem (CARTIA XT) 240 MG 24 hr capsule Take 1 capsule (240 mg total) by mouth daily. 30 capsule 11  . flecainide (TAMBOCOR) 100 MG tablet Take 1 tablet (100 mg total) by mouth 2 (two) times daily. 180 tablet 3  . L-Arginine 500 MG CAPS Take 1 capsule by mouth 2 (two) times daily.    Marland Kitchen omeprazole (PRILOSEC) 20 MG capsule Take 20 mg by mouth daily.    . rivaroxaban (XARELTO) 20 MG TABS tablet Take 1 tablet (20 mg total) by mouth daily with supper. 30 tablet 11   No facility-administered medications prior to visit.     Allergies:   Review of patient's allergies indicates no known allergies.   Social History   Social History  . Marital Status: Divorced    Spouse Name:  N/A  . Number of Children: N/A  . Years of Education: N/A   Social History Main Topics  . Smoking status: Never Smoker   . Smokeless tobacco: None     Comment: denies any tobacco use   . Alcohol Use: 0.6 oz/week    1 Cans of beer per week     Comment: lass than 1 alcoholic beverage/week   . Drug Use: No  . Sexual Activity: Not Asked   Other Topics Concern  . 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      Family History:  The patient's family history includes CAD in his father; Heart attack in his father. There is no history of Atrial fibrillation or Anesthesia problems.   ROS:   Please see the history of present illness.    ROS All other systems reviewed and are  negative.   PHYSICAL EXAM:   VS:  BP 146/80 mmHg  Pulse 58  Ht 5\' 11"  (1.803 m)  Wt 232 lb 12.8 oz (105.597 kg)  BMI 32.48 kg/m2  SpO2 96%   GEN: Well nourished, well developed, in no acute distress HEENT: normal Neck: no JVD, carotid bruits, or masses Cardiac: RRR; no murmurs, rubs, or gallops,no edema.  Intact distal pulses bilaterally.  Respiratory:  clear to auscultation bilaterally, normal work of breathing GI: soft, nontender, nondistended, + BS MS: no deformity or atrophy Skin: warm and dry, no rash Neuro:  Alert and Oriented x 3, Strength and sensation are intact Psych: euthymic mood, full affect  Wt Readings from Last 3 Encounters:  12/01/15 232 lb 12.8 oz (105.597 kg)  10/06/15 233 lb 3.2 oz (105.779 kg)  09/18/15 228 lb (103.42 kg)      Studies/Labs Reviewed:   EKG:  EKG is not ordered today.   Recent Labs: 09/18/2015: BUN 14; Creat 1.02; Hemoglobin 16.5; Platelets 179; Potassium 4.4; Sodium 141   Lipid Panel No results found for: CHOL, TRIG, HDL, CHOLHDL, VLDL, LDLCALC, LDLDIRECT  Additional studies/ records that were reviewed today include:  CPAP download    ASSESSMENT:    1. OSA (obstructive sleep apnea)   2. Essential hypertension   3. Obesity (BMI 30-39.9)      PLAN:  In order of problems listed above:  OSA - the patient is tolerating PAP therapy well without any problems. The PAP download was reviewed today and showed an AHI of 0.4/hr on 7 cm H2O with 99% compliance in using more than 4 hours nightly.  The patient has been using and benefiting from CPAP use and will continue to benefit from therapy.  HTN - BP controlled on current medical regimen.  Continue CCB Obesity - I have encouraged him to get into a routine exercise program and cut back on carbs and portions.     Medication Adjustments/Labs and Tests Ordered: Current medicines are reviewed at length with the patient today.  Concerns regarding medicines are outlined above.  Medication  changes, Labs and Tests ordered today are listed in the Patient Instructions below.  Patient Instructions  Medication Instructions:  Your physician recommends that you continue on your current medications as directed. Please refer to the Current Medication list given to you today.   Labwork: None  Testing/Procedures: None  Follow-Up: Your physician wants you to follow-up in: 1 year with Dr. Radford Pax. You will receive a reminder letter in the mail two months in advance. If you don't receive a letter, please call our  office to schedule the follow-up appointment.   Any Other Special Instructions Will Be Listed Below (If Applicable).     If you need a refill on your cardiac medications before your next appointment, please call your pharmacy.       Signed, Fransico Him, MD  12/01/2015 9:09 AM    [address], [place], [place]  [address] Phone: [phone]; Fax: [phone]

## 2015-12-05 DIAGNOSIS — S61200A Unspecified open wound of right index finger without damage to nail, initial encounter: Secondary | ICD-10-CM | POA: Diagnosis not present

## 2015-12-15 DIAGNOSIS — Z4802 Encounter for removal of sutures: Secondary | ICD-10-CM | POA: Diagnosis not present

## 2016-01-09 ENCOUNTER — Encounter (HOSPITAL_COMMUNITY): Payer: Self-pay | Admitting: Nurse Practitioner

## 2016-01-09 ENCOUNTER — Ambulatory Visit (HOSPITAL_COMMUNITY)
Admission: RE | Admit: 2016-01-09 | Discharge: 2016-01-09 | Disposition: A | Discharge status: Home / Self Care | Payer: Medicare Other | Source: Ambulatory Visit | Attending: Nurse Practitioner | Admitting: Nurse Practitioner

## 2016-01-09 VITALS — BP 134/68 | HR 57 | Ht 71.0 in | Wt 234.4 lb

## 2016-01-09 DIAGNOSIS — I491 Atrial premature depolarization: Secondary | ICD-10-CM | POA: Insufficient documentation

## 2016-01-09 DIAGNOSIS — Z79899 Other long term (current) drug therapy: Secondary | ICD-10-CM | POA: Insufficient documentation

## 2016-01-09 DIAGNOSIS — K219 Gastro-esophageal reflux disease without esophagitis: Secondary | ICD-10-CM | POA: Diagnosis not present

## 2016-01-09 DIAGNOSIS — R001 Bradycardia, unspecified: Secondary | ICD-10-CM | POA: Insufficient documentation

## 2016-01-09 DIAGNOSIS — E669 Obesity, unspecified: Secondary | ICD-10-CM | POA: Diagnosis not present

## 2016-01-09 DIAGNOSIS — G4733 Obstructive sleep apnea (adult) (pediatric): Secondary | ICD-10-CM | POA: Diagnosis not present

## 2016-01-09 DIAGNOSIS — I10 Essential (primary) hypertension: Secondary | ICD-10-CM | POA: Insufficient documentation

## 2016-01-09 DIAGNOSIS — Z6830 Body mass index (BMI) 30.0-30.9, adult: Secondary | ICD-10-CM | POA: Diagnosis not present

## 2016-01-09 DIAGNOSIS — I480 Paroxysmal atrial fibrillation: Secondary | ICD-10-CM

## 2016-01-09 DIAGNOSIS — R9431 Abnormal electrocardiogram [ECG] [EKG]: Secondary | ICD-10-CM | POA: Diagnosis not present

## 2016-01-09 DIAGNOSIS — Z7901 Long term (current) use of anticoagulants: Secondary | ICD-10-CM | POA: Insufficient documentation

## 2016-01-09 DIAGNOSIS — I4891 Unspecified atrial fibrillation: Secondary | ICD-10-CM | POA: Diagnosis present

## 2016-01-09 NOTE — Progress Notes (Signed)
Patient ID: Carlos Thomas, male   DOB: 1950-11-04, 65 y.o.   MRN: [id number]     Primary Care Physician: Shirline Frees, MD Referring Physician: Dr. Loney Hering Carlos Thomas is a 65 y.o. male with a h/o PAF for f/u in the afib clinic. He reports that he had a  successful cardioversion in March of this year. He has not had any more significant afib. Taking flecainide consistently as well as xarelto, no bleeding issues. Uses CPAP for OSA.  Today, he denies symptoms of palpitations, chest pain, shortness of breath, orthopnea, PND, lower extremity edema, dizziness, presyncope, syncope, or neurologic sequela. The patient is tolerating medications without difficulties and is otherwise without complaint today.   Past Medical History  Diagnosis Date  . HTN (hypertension)   . Atrial flutter [facility])     s/p CTI ablation 2010 by Dr Rayann Heman  . Atrial fibrillation (HCC)     paroxysmal, s/p afib ablation 3/13 by Dr Rayann Heman  . GERD (gastroesophageal reflux disease)   . Pneumonia     hx of pneumonia  . H/O hiatal hernia   . Psychosocial stressors     chronic  . MRSA (methicillin resistant Staphylococcus aureus)     H/O flare ups  . Adenomatous polyps     tubular on colonoscopy 2/11  . OSA (obstructive sleep apnea)     Mild w AHI 6.57/hr now on 13cm H2O  . Obesity (BMI 30-39.9)    Past Surgical History  Procedure Laterality Date  . Cti ablation  7/10    for atrial flutter by Dr Rayann Heman  . Hernia repair    . Cholecystectomy    . Tee without cardioversion  09/12/2011    Procedure: TRANSESOPHAGEAL ECHOCARDIOGRAM (TEE);  Surgeon: Jettie Booze, MD;  Location: [facility] ENDOSCOPY;  Service: Cardiovascular;  Laterality: N/A;  . Atrial fibrillation ablation  09/13/11    PVI by Dr Rayann Heman  . Colonoscopy  08/2009  . Atrial fibrillation ablation N/A 09/13/2011    Procedure: ATRIAL FIBRILLATION ABLATION;  Surgeon: Thompson Grayer, MD;  Location: [facility] CATH LAB;  Service: Cardiovascular;  Laterality: N/A;  .  Tee without cardioversion N/A 09/21/2015    Procedure: TRANSESOPHAGEAL ECHOCARDIOGRAM (TEE);  Surgeon: Lelon Perla, MD;  Location: [facility] ENDOSCOPY;  Service: Cardiovascular;  Laterality: N/A;  . Cardioversion N/A 09/21/2015    Procedure: CARDIOVERSION;  Surgeon: Lelon Perla, MD;  Location: [facility] ENDOSCOPY;  Service: Cardiovascular;  Laterality: N/A;    Current Outpatient Prescriptions  Medication Sig Dispense Refill  . diltiazem (CARTIA XT) 240 MG 24 hr capsule Take 1 capsule (240 mg total) by mouth daily. 30 capsule 11  . flecainide (TAMBOCOR) 100 MG tablet Take 1 tablet (100 mg total) by mouth 2 (two) times daily. 180 tablet 3  . L-Arginine 500 MG CAPS Take 1 capsule by mouth 2 (two) times daily.    Marland Kitchen omeprazole (PRILOSEC) 20 MG capsule Take 20 mg by mouth daily.    . rivaroxaban (XARELTO) 20 MG TABS tablet Take 1 tablet (20 mg total) by mouth daily with supper. 30 tablet 11   No current facility-administered medications for this encounter.    No Known Allergies  Social History   Social History  . Marital Status: Divorced    Spouse Name: N/A  . Number of Children: N/A  . Years of Education: N/A   Occupational History  . Not on file.   Social History Main Topics  . Smoking status: Never Smoker   . Smokeless  tobacco: Not on file     Comment: denies any tobacco use   . Alcohol Use: 0.6 oz/week    1 Cans of beer per week     Comment: lass than 1 alcoholic beverage/week   . Drug Use: No  . Sexual Activity: Not on file   Other Topics Concern  . 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     Family History  Problem Relation Age of Onset  . Atrial fibrillation Neg Hx   . Anesthesia problems Neg Hx   . CAD Father   . Heart attack Father     ROS- All systems are reviewed and negative except as per the HPI above  Physical Exam: Filed Vitals:   01/09/16 1543  BP: 134/68  Pulse: 57  Height: 5\' 11"  (1.803 m)    Weight: 234 lb 6.4 oz (106.323 kg)    GEN- The patient is well appearing, alert and oriented x 3 today.   Head- normocephalic, atraumatic Eyes-  Sclera clear, conjunctiva pink Ears- hearing intact Oropharynx- clear Neck- supple, no JVP Lymph- no cervical lymphadenopathy Lungs- Clear to ausculation bilaterally, normal work of breathing Heart- Regular rate and rhythm, no murmurs, rubs or gallops, PMI not laterally displaced GI- soft, NT, ND, + BS Extremities- no clubbing, cyanosis, or edema MS- no significant deformity or atrophy Skin- no rash or lesion Psych- euthymic mood, full affect Neuro- strength and sensation are intact  EKG-Sinus brady, at 57 bpm, pr int 178 ms, qrs int 92 ms, qtc 389 ms Epic records reviewed.  Assessment and Plan: 1. PAF Doing well with flecainide 100 mg bid Continue diltiazem Continue xarelto, last Creatinine 1.02 3/17  F/u in afib clinic 3 months   Butch Penny C. Justin Buechner, [facility] [address] [place],  [address] [phone]

## 2016-02-28 ENCOUNTER — Telehealth: Payer: Self-pay | Admitting: Cardiology

## 2016-02-28 NOTE — Telephone Encounter (Signed)
New message         *STAT* If patient is at the pharmacy, call can be transferred to refill team.   1. Which medications need to be refilled? (please list name of each medication and dose if known) xarelto 20mg  2. Which pharmacy/location (including street and city if local pharmacy) is medication to be sent to? [facility]  3. Do they need a 30 day or 90 day supply? 30 Pt had refills but called pharmacy some time ago and told them that he did not take this medication any more.  [facility] closed out the presc.  Now, pt has called wanting refills, but [facility] needs a new presc

## 2016-02-28 NOTE — Telephone Encounter (Signed)
Called pt and informed him that he has refills left at his pharmacy and I also called the pharmacy to confirm that they did have refills and it was confirmed. I advised the pt that if he has any other problems, questions or concerns to call our office. Pt verbalized understanding.

## 2016-03-18 DIAGNOSIS — Z13220 Encounter for screening for lipoid disorders: Secondary | ICD-10-CM | POA: Diagnosis not present

## 2016-03-18 DIAGNOSIS — Z125 Encounter for screening for malignant neoplasm of prostate: Secondary | ICD-10-CM | POA: Diagnosis not present

## 2016-03-18 DIAGNOSIS — N481 Balanitis: Secondary | ICD-10-CM | POA: Diagnosis not present

## 2016-03-18 DIAGNOSIS — M25562 Pain in left knee: Secondary | ICD-10-CM | POA: Diagnosis not present

## 2016-03-18 DIAGNOSIS — G609 Hereditary and idiopathic neuropathy, unspecified: Secondary | ICD-10-CM | POA: Diagnosis not present

## 2016-03-19 ENCOUNTER — Other Ambulatory Visit: Payer: Self-pay | Admitting: *Deleted

## 2016-03-19 DIAGNOSIS — G609 Hereditary and idiopathic neuropathy, unspecified: Secondary | ICD-10-CM

## 2016-03-21 ENCOUNTER — Ambulatory Visit (INDEPENDENT_AMBULATORY_CARE_PROVIDER_SITE_OTHER): Payer: Medicare Other | Admitting: Neurology

## 2016-03-21 DIAGNOSIS — G609 Hereditary and idiopathic neuropathy, unspecified: Secondary | ICD-10-CM

## 2016-03-21 NOTE — Procedures (Signed)
[facility]  [place], [place]  [place], [place] [address] Tel: [phone] Fax:  [phone] Test Date:  03/21/2016  Patient: Carlos Thomas DOB: [birth year] Physician: Narda Amber, DO  Sex: Male Height: 5'11" Ref Phys: Shirline Frees, MD  ID#: [id number] Temp: 32.6C Technician: Jerilynn Mages. Dean   Patient Complaints: This is a 65 year old gentleman referred for evaluation of bilateral foot numbness.  NCV & EMG Findings: Extensive electrodiagnostic testing of the right lower extremity and additional studies of the left shows: 1. Bilateral sural sensory responses are absent.  Bilateral superficial peroneal sensory responses are within normal limits. 2. Left tibial motor response shows reduced amplitude. Right tibial and bilateral peroneal motor responses are within the limits. 3. Bilateral tibial H reflex studies are prolonged. 4. Sparse chronic motor axon loss changes are isolated to the tibialis anterior and medial gastrocnemius muscles bilaterally. There is no evidence of active denervation.  Impression: The electrophysiologic findings are most consistent with a distal and symmetric sensorimotor polyneuropathy, axon loss in type, affecting the lower extremities. Overall, these findings are mild to moderate in degree electrically.   ___________________________ Narda Amber, DO    Nerve Conduction Studies Anti Sensory Summary Table   Stim Site NR Peak (ms) Norm Peak (ms) P-T Amp (V) Norm P-T Amp  Left Sup Peroneal Anti Sensory (Ant Lat Mall)  32.6C  12 cm    2.4 <4.6 5.1 >3  Right Sup Peroneal Anti Sensory (Ant Lat Mall)  32.6C  12 cm    2.4 <4.6 5.9 >3  Left Sural Anti Sensory (Lat Mall)  32.6C  Calf NR  <4.6  >3  Right Sural Anti Sensory (Lat Mall)  32.6C  Calf NR  <4.6  >3   Motor Summary Table   Stim Site NR Onset (ms) Norm Onset (ms) O-P Amp (mV) Norm O-P Amp Site1 Site2 Delta-0 (ms) Dist (cm) Vel (m/s) Norm Vel (m/s)  Left Peroneal Motor (Ext Dig  Brev)  32.6C  Ankle    3.7 <6.0 2.7 >2.5 B Fib Ankle 8.6 36.0 42 >40  B Fib    12.3  2.1  Poplt B Fib 2.2 10.0 45 >40  Poplt    14.5  2.0         Right Peroneal Motor (Ext Dig Brev)  32.6C  Ankle    4.5 <6.0 3.5 >2.5 B Fib Ankle 7.5 33.0 44 >40  B Fib    12.0  2.6  Poplt B Fib 1.8 10.0 56 >40  Poplt    13.8  2.5         Left Tibial Motor (Abd Hall Brev)  32.6C  Ankle    3.9 <6.0 2.1 >4 Knee Ankle 10.1 44.0 44 >40  Knee    14.0  0.9         Right Tibial Motor (Abd Hall Brev)  32.6C  Ankle    4.2 <6.0 5.8 >4 Knee Ankle 10.0 41.0 41 >40  Knee    14.2  3.9          H Reflex Studies   NR H-Lat (ms) Lat Norm (ms) L-R H-Lat (ms) M-Lat (ms) HLat-MLat (ms)  Left Tibial (Gastroc)  32.6C     38.23 <35 1.36 4.08 34.15  Right Tibial (Gastroc)  32.6C     36.87 <35 1.36 4.08 32.79   EMG   Side Muscle Ins Act Fibs Psw Fasc Number Recrt Dur Dur. Amp Amp. Poly Poly. Comment  Right AntTibialis Nml Nml Nml Nml 1-  Rapid Few 1+ Few 1+ Nml Nml N/A  Right Gastroc Nml Nml Nml Nml 1- Rapid Few 1+ Few 1+ Nml Nml N/A  Right RectFemoris Nml Nml Nml Nml Nml Nml Nml Nml Nml Nml Nml Nml N/A  Right BicepsFemS Nml Nml Nml Nml Nml Nml Nml Nml Nml Nml Nml Nml N/A  Left BicepsFemS Nml Nml Nml Nml Nml Nml Nml Nml Nml Nml Nml Nml N/A  Left Gastroc Nml Nml Nml Nml 1- Rapid Few 1+ Few 1+ Nml Nml N/A  Left AntTibialis Nml Nml Nml Nml 1- Rapid Few 1+ Few 1+ Nml Nml N/A  Left RectFemoris Nml Nml Nml Nml Nml Nml Nml Nml Nml Nml Nml Nml N/A      Waveforms:

## 2016-03-26 ENCOUNTER — Other Ambulatory Visit: Payer: Self-pay | Admitting: Cardiology

## 2016-04-04 DIAGNOSIS — Z125 Encounter for screening for malignant neoplasm of prostate: Secondary | ICD-10-CM | POA: Diagnosis not present

## 2016-04-04 DIAGNOSIS — R7309 Other abnormal glucose: Secondary | ICD-10-CM | POA: Diagnosis not present

## 2016-04-04 DIAGNOSIS — G609 Hereditary and idiopathic neuropathy, unspecified: Secondary | ICD-10-CM | POA: Diagnosis not present

## 2016-04-04 DIAGNOSIS — Z136 Encounter for screening for cardiovascular disorders: Secondary | ICD-10-CM | POA: Diagnosis not present

## 2016-04-19 ENCOUNTER — Other Ambulatory Visit: Payer: Self-pay | Admitting: Cardiology

## 2016-04-24 ENCOUNTER — Ambulatory Visit (HOSPITAL_COMMUNITY)
Admission: RE | Admit: 2016-04-24 | Discharge: 2016-04-24 | Disposition: A | Discharge status: Home / Self Care | Payer: Medicare Other | Source: Ambulatory Visit | Attending: Nurse Practitioner | Admitting: Nurse Practitioner

## 2016-04-24 ENCOUNTER — Encounter (HOSPITAL_COMMUNITY): Payer: Self-pay | Admitting: Nurse Practitioner

## 2016-04-24 VITALS — BP 146/78 | HR 59

## 2016-04-24 DIAGNOSIS — E669 Obesity, unspecified: Secondary | ICD-10-CM | POA: Diagnosis not present

## 2016-04-24 DIAGNOSIS — Z8249 Family history of ischemic heart disease and other diseases of the circulatory system: Secondary | ICD-10-CM | POA: Insufficient documentation

## 2016-04-24 DIAGNOSIS — K449 Diaphragmatic hernia without obstruction or gangrene: Secondary | ICD-10-CM | POA: Diagnosis not present

## 2016-04-24 DIAGNOSIS — G4733 Obstructive sleep apnea (adult) (pediatric): Secondary | ICD-10-CM | POA: Diagnosis not present

## 2016-04-24 DIAGNOSIS — K219 Gastro-esophageal reflux disease without esophagitis: Secondary | ICD-10-CM | POA: Diagnosis not present

## 2016-04-24 DIAGNOSIS — I10 Essential (primary) hypertension: Secondary | ICD-10-CM | POA: Diagnosis not present

## 2016-04-24 DIAGNOSIS — Z6839 Body mass index (BMI) 39.0-39.9, adult: Secondary | ICD-10-CM | POA: Diagnosis not present

## 2016-04-24 DIAGNOSIS — Z9889 Other specified postprocedural states: Secondary | ICD-10-CM | POA: Insufficient documentation

## 2016-04-24 DIAGNOSIS — I445 Left posterior fascicular block: Secondary | ICD-10-CM | POA: Insufficient documentation

## 2016-04-24 DIAGNOSIS — Z7901 Long term (current) use of anticoagulants: Secondary | ICD-10-CM | POA: Diagnosis not present

## 2016-04-24 DIAGNOSIS — I4892 Unspecified atrial flutter: Secondary | ICD-10-CM | POA: Insufficient documentation

## 2016-04-24 DIAGNOSIS — I480 Paroxysmal atrial fibrillation: Secondary | ICD-10-CM

## 2016-04-24 DIAGNOSIS — A4902 Methicillin resistant Staphylococcus aureus infection, unspecified site: Secondary | ICD-10-CM | POA: Insufficient documentation

## 2016-04-24 DIAGNOSIS — R001 Bradycardia, unspecified: Secondary | ICD-10-CM | POA: Diagnosis not present

## 2016-04-24 NOTE — Progress Notes (Signed)
Patient ID: Carlos Thomas, male   DOB: 1951/01/30, 65 y.o.   MRN: [id number]     Primary Care Physician: Carlos Frees, MD Referring Physician: Dr. Loney Hering Carlos Thomas is a 65 y.o. male with a h/o paroxsymal afib for f/u in the afib clinic. He reports that he had a  successful cardioversion in March of this year. He has not had any more significant afib. Taking flecainide consistently as well as xarelto, no bleeding issues. Uses CPAP for OSA consistently.  Today, he denies symptoms of palpitations, chest pain, shortness of breath, orthopnea, PND, lower extremity edema, dizziness, presyncope, syncope, or neurologic sequela. The patient is tolerating medications without difficulties and is otherwise without complaint today.   Past Medical History:  Diagnosis Date  . Adenomatous polyps    tubular on colonoscopy 2/11  . Atrial fibrillation (HCC)    paroxysmal, s/p afib ablation 3/13 by Dr Rayann Heman  . Atrial flutter [facility])    s/p CTI ablation 2010 by Dr Rayann Heman  . GERD (gastroesophageal reflux disease)   . H/O hiatal hernia   . HTN (hypertension)   . MRSA (methicillin resistant Staphylococcus aureus)    H/O flare ups  . Obesity (BMI 30-39.9)   . OSA (obstructive sleep apnea)    Mild w AHI 6.57/hr now on 13cm H2O  . Pneumonia    hx of pneumonia  . Psychosocial stressors    chronic   Past Surgical History:  Procedure Laterality Date  . Atrial fibrillation ablation  09/13/11   PVI by Dr Rayann Heman  . ATRIAL FIBRILLATION ABLATION N/A 09/13/2011   Procedure: ATRIAL FIBRILLATION ABLATION;  Surgeon: Thompson Grayer, MD;  Location: [facility] CATH LAB;  Service: Cardiovascular;  Laterality: N/A;  . CARDIOVERSION N/A 09/21/2015   Procedure: CARDIOVERSION;  Surgeon: Lelon Perla, MD;  Location: [facility] ENDOSCOPY;  Service: Cardiovascular;  Laterality: N/A;  . CHOLECYSTECTOMY    . COLONOSCOPY  08/2009  . CTI ablation  7/10   for atrial flutter by Dr Rayann Heman  . HERNIA REPAIR    . TEE WITHOUT  CARDIOVERSION  09/12/2011   Procedure: TRANSESOPHAGEAL ECHOCARDIOGRAM (TEE);  Surgeon: Jettie Booze, MD;  Location: [place];  Service: Cardiovascular;  Laterality: N/A;  . TEE WITHOUT CARDIOVERSION N/A 09/21/2015   Procedure: TRANSESOPHAGEAL ECHOCARDIOGRAM (TEE);  Surgeon: Lelon Perla, MD;  Location: [facility] ENDOSCOPY;  Service: Cardiovascular;  Laterality: N/A;    Current Outpatient Prescriptions  Medication Sig Dispense Refill  . CARTIA XT 240 MG 24 hr capsule TAKE 1 CAPSULE(240 MG) BY MOUTH DAILY 30 capsule 11  . flecainide (TAMBOCOR) 100 MG tablet Take 1 tablet (100 mg total) by mouth 2 (two) times daily. 180 tablet 3  . L-Arginine 500 MG CAPS Take 1 capsule by mouth 2 (two) times daily.    Marland Kitchen omeprazole (PRILOSEC) 20 MG capsule Take 20 mg by mouth daily.    . rivaroxaban (XARELTO) 20 MG TABS tablet Take 1 tablet (20 mg total) by mouth daily with supper. 30 tablet 11   No current facility-administered medications for this encounter.     No Known Allergies  Social History   Social History  . Marital status: Divorced    Spouse name: N/A  . Number of children: N/A  . Years of education: N/A   Occupational History  . Not on file.   Social History Main Topics  . Smoking status: Never Smoker  . Smokeless tobacco: Not on file     Comment: denies any tobacco use   .  Alcohol use 0.6 oz/week    1 Cans of beer per week     Comment: lass than 1 alcoholic beverage/week   . Drug use: No  . Sexual activity: Not on file   Other Topics Concern  . 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     Family History  Problem Relation Age of Onset  . Atrial fibrillation Neg Hx   . Anesthesia problems Neg Hx   . CAD Father   . Heart attack Father     ROS- All systems are reviewed and negative except as per the HPI above  Physical Exam: Vitals:   04/24/16 1527  BP: (!) 146/78  Pulse: (!) 59    GEN- The patient is  well appearing, alert and oriented x 3 today.   Head- normocephalic, atraumatic Eyes-  Sclera clear, conjunctiva pink Ears- hearing intact Oropharynx- clear Neck- supple, no JVP Lymph- no cervical lymphadenopathy Lungs- Clear to ausculation bilaterally, normal work of breathing Heart- Regular rate and rhythm, no murmurs, rubs or gallops, PMI not laterally displaced GI- soft, NT, ND, + BS Extremities- no clubbing, cyanosis, or edema MS- no significant deformity or atrophy Skin- no rash or lesion Psych- euthymic mood, full affect Neuro- strength and sensation are intact  EKG-Sinus brady, at 59 bpm, pr int 172 ms, qrs int 94 ms, qtc 390 ms Epic records reviewed.  Assessment and Plan: 1. PAF Doing well with flecainide 100 mg bid Continue diltiazem Continue xarelto   F/u in afib clinic 3 months   Butch Penny C. Eily Louvier, [facility] [address] [place], [place] [address] [phone]

## 2016-05-23 ENCOUNTER — Other Ambulatory Visit: Payer: Self-pay | Admitting: Cardiology

## 2016-05-27 ENCOUNTER — Other Ambulatory Visit: Payer: Self-pay | Admitting: *Deleted

## 2016-05-27 DIAGNOSIS — N481 Balanitis: Secondary | ICD-10-CM | POA: Diagnosis not present

## 2016-05-27 DIAGNOSIS — N476 Balanoposthitis: Secondary | ICD-10-CM | POA: Diagnosis not present

## 2016-05-27 MED ORDER — FLECAINIDE ACETATE 100 MG PO TABS: 100.0000 mg | ORAL_TABLET | Freq: Two times a day (BID) | ORAL | 3 refills | Status: DC

## 2016-05-28 ENCOUNTER — Telehealth: Payer: Self-pay | Admitting: Cardiology

## 2016-05-28 NOTE — Telephone Encounter (Signed)
Request for surgical clearance:  1. What type of surgery is being performed? Circumcision   2. When is this surgery scheduled? Not scheduled yet   3. Are there any medications that need to be held prior to surgery and how long?Can he hold his Xarelto and for how long?   4. Name of physician performing surgery? Dr SigmondTannenbaum   5. What is your office phone and fax number? [phone] and fax is [phone]

## 2016-05-28 NOTE — Telephone Encounter (Signed)
He may proceed with surgery. Hold Xarelto for 2 days. Resume one day post op. Candee Furbish, MD

## 2016-05-29 NOTE — Telephone Encounter (Signed)
faxed

## 2016-05-31 ENCOUNTER — Other Ambulatory Visit: Payer: Self-pay | Admitting: Urology

## 2016-06-03 ENCOUNTER — Encounter: Payer: Self-pay | Admitting: Neurology

## 2016-06-03 ENCOUNTER — Ambulatory Visit (INDEPENDENT_AMBULATORY_CARE_PROVIDER_SITE_OTHER): Payer: Medicare Other | Admitting: Neurology

## 2016-06-03 ENCOUNTER — Other Ambulatory Visit: Payer: Self-pay | Admitting: Neurology

## 2016-06-03 ENCOUNTER — Other Ambulatory Visit: Payer: Medicare Other

## 2016-06-03 VITALS — BP 124/70 | HR 58 | Ht 71.0 in | Wt 232.1 lb

## 2016-06-03 DIAGNOSIS — G609 Hereditary and idiopathic neuropathy, unspecified: Secondary | ICD-10-CM | POA: Diagnosis not present

## 2016-06-03 NOTE — Patient Instructions (Addendum)
1.  Check blood work 2.  Return to clinic in 6 months

## 2016-06-03 NOTE — Progress Notes (Signed)
[facility] Clinic Note - Initial Visit   Date: 06/03/16  Carlos Thomas MRN: [id number] DOB: [birth year]   Dear Dr. Kenton Kingfisher:  Thank you for your kind referral of Carlos Thomas for consultation of feet presthesias. Although his history is well known to you, please allow [place] to reiterate it for the purpose of our medical record. The patient was accompanied to the clinic by self.    History of Present Illness: Carlos Thomas is a 65 y.o. Caucasian male with GERD, OSA on CPAP, and atrial fibrillation presenting for evaluation of bilateral feet paresthesias.  Starting in 2016, he began noticing numbness of the great toe, which gradually progressed to the based of the toes and a few months later developed the same symptoms over the right toe.  Symptoms are constant and not painful.  There is no tingling or burning.  He denies any imbalance, and walks independently without any falls.  He started taking L-arginine about 6 months ago at the recommendation of his chiropractor. Electrodiagnostic testing in September 2016 showed axonal sensorimotor polyneuropathy affecting the legs.  He reports taking welding course in August of 2016 and now works part-time as a Building control surveyor at [employer].  He has no personal history of diabetes.  He stopped drinking alcohol in 2010, when he was drinking on the 2-3 liquor drinks on the weekends.  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  No family history of neuropathy.    Out-side paper records, electronic medical record, and images have been reviewed where available and summarized as:  NCS/EMG of the legs 03/21/2016:  The electrophysiologic findings are most consistent with a distal and symmetric sensorimotor polyneuropathy, axon loss in type, affecting the lower extremities. Overall, these findings are mild to moderate in degree electrically.  Labs 04/04/2016:  HbA1c 5.8, ESR 4, TSH 1.26      Past Medical History:  Diagnosis Date  . Adenomatous polyps    tubular on colonoscopy 2/11  . Atrial fibrillation (HCC)    paroxysmal, s/p afib ablation 3/13 by Dr Rayann Heman  . Atrial flutter [facility])    s/p CTI ablation 2010 by Dr Rayann Heman  . GERD (gastroesophageal reflux disease)   . H/O hiatal hernia   . HTN (hypertension)   . MRSA (methicillin resistant Staphylococcus aureus)    H/O flare ups  . Obesity (BMI 30-39.9)   . OSA (obstructive sleep apnea)    Mild w AHI 6.57/hr now on 13cm H2O  . Pneumonia    hx of pneumonia  . Psychosocial stressors    chronic    Past Surgical History:  Procedure Laterality Date  . Atrial fibrillation ablation  09/13/11   PVI by Dr Rayann Heman  . ATRIAL FIBRILLATION ABLATION N/A 09/13/2011   Procedure: ATRIAL FIBRILLATION ABLATION;  Surgeon: Thompson Grayer, MD;  Location: [facility] CATH LAB;  Service: Cardiovascular;  Laterality: N/A;  . CARDIOVERSION N/A 09/21/2015   Procedure: CARDIOVERSION;  Surgeon: Lelon Perla, MD;  Location: [facility] ENDOSCOPY;  Service: Cardiovascular;  Laterality: N/A;  . CHOLECYSTECTOMY    . COLONOSCOPY  08/2009  . CTI ablation  7/10   for atrial flutter by Dr Rayann Heman  . HERNIA REPAIR    . TEE WITHOUT CARDIOVERSION  09/12/2011   Procedure: TRANSESOPHAGEAL ECHOCARDIOGRAM (TEE);  Surgeon: Jettie Booze, MD;  Location: [place];  Service: Cardiovascular;  Laterality: N/A;  . TEE WITHOUT CARDIOVERSION N/A 09/21/2015   Procedure: TRANSESOPHAGEAL ECHOCARDIOGRAM (TEE);  Surgeon: Lelon Perla, MD;  Location: [facility] ENDOSCOPY;  Service: Cardiovascular;  Laterality: N/A;     Medications:  Outpatient Encounter Prescriptions as of 06/03/2016  Medication Sig  . CARTIA XT 240 MG 24 hr capsule TAKE 1 CAPSULE(240 MG) BY MOUTH DAILY  . flecainide (TAMBOCOR) 100 MG tablet Take 1 tablet (100 mg total) by mouth 2 (two) times daily.  Marland Kitchen omeprazole (PRILOSEC) 20 MG capsule Take 20 mg by mouth daily.  . rivaroxaban (XARELTO) 20 MG TABS tablet Take 1  tablet (20 mg total) by mouth daily with supper.  . [DISCONTINUED] L-Arginine 500 MG CAPS Take 1 capsule by mouth 2 (two) times daily.   No facility-administered encounter medications on file as of 06/03/2016.      Allergies: No Known Allergies  Family History: Family History  Problem Relation Age of Onset  . CAD Father   . Heart attack Father   . Atrial fibrillation Neg Hx   . Anesthesia problems Neg Hx     Social History: Social History  Substance Use Topics  . Smoking status: Never Smoker  . Smokeless tobacco: Never Used     Comment: denies any tobacco use   . 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  Education: college.    Review of Systems:  CONSTITUTIONAL: No fevers, chills, night sweats, or weight loss.   EYES: No visual changes or eye pain ENT: No hearing changes.  No history of nose bleeds.   RESPIRATORY: No cough, wheezing and shortness of breath.   CARDIOVASCULAR: Negative for chest pain, and palpitations.   GI: Negative for abdominal discomfort, blood in stools or black stools.  No recent change in bowel habits.   GU:  No history of incontinence.   MUSCLOSKELETAL: No history of joint pain or swelling.  No myalgias.   SKIN: Negative for lesions, rash, and itching.   HEMATOLOGY/ONCOLOGY: Negative for prolonged bleeding, bruising easily, and swollen nodes.  No history of cancer.   ENDOCRINE: Negative for cold or heat intolerance, polydipsia or goiter.   PSYCH:  No depression or anxiety symptoms.   NEURO: As Above.   Vital Signs:  BP 124/70   Pulse (!) 58   Ht _0  (1.803 m)   Wt 232 lb 2 oz (105.3 kg)   SpO2 96%   BMI 32.37 kg/m   Neurological Exam: MENTAL STATUS including orientation to time, place, person, recent and remote memory,  attention span and concentration, language, and fund of knowledge is normal.  Speech is not dysarthric.  CRANIAL NERVES: II:  No visual field defects.  Unremarkable fundi.   III-IV-VI: Pupils equal round and reactive to light.  Normal conjugate, extra-ocular eye movements in all directions of gaze.  No nystagmus.  No ptosis.   V:  Normal facial sensation.   VII:  Normal facial symmetry and movements.   VIII:  Normal hearing and vestibular function.   IX-X:  Normal palatal movement.   XI:  Normal shoulder shrug and head rotation.   XII:  Normal tongue strength and range of motion, no deviation or fasciculation.  MOTOR:  No atrophy, fasciculations or abnormal movements.  No pronator drift.  Tone is normal.    Right Upper Extremity:  Left Upper Extremity:    Deltoid  5/5   Deltoid  5/5   Biceps  5/5   Biceps  5/5   Triceps  5/5   Triceps  5/5   Wrist extensors  5/5   Wrist extensors  5/5   Wrist flexors  5/5   Wrist flexors  5/5   Finger extensors  5/5   Finger extensors  5/5   Finger flexors  5/5   Finger flexors  5/5   Dorsal interossei  5/5   Dorsal interossei  5/5   Abductor pollicis  5/5   Abductor pollicis  5/5   Tone (Ashworth scale)  0  Tone (Ashworth scale)  0   Right Lower Extremity:    Left Lower Extremity:    Hip flexors  5/5   Hip flexors  5/5   Hip extensors  5/5   Hip extensors  5/5   Knee flexors  5/5   Knee flexors  5/5   Knee extensors  5/5   Knee extensors  5/5   Dorsiflexors  5/5   Dorsiflexors  5/5   Plantarflexors  5/5   Plantarflexors  5/5   Toe extensors  5/5   Toe extensors  5/5   Toe flexors  5/5   Toe flexors  5/5   Tone (Ashworth scale)  0  Tone (Ashworth scale)  0   MSRs:  Right                                                                 Left brachioradialis 2+  brachioradialis 2+  biceps 2+  biceps 2+  triceps 2+  triceps 2+  patellar 2+  patellar 2+  ankle jerk 0  ankle jerk 0  Hoffman no  Hoffman no  plantar response down  plantar  response down   SENSORY:  Vibration is absent at the great toe bilaterally.  Light touch, temperature, and pin prick is intact.  Romberg's sign absent.   COORDINATION/GAIT: Normal finger-to- nose-finger and heel-to-shin.  Intact rapid alternating movements bilaterally.  Able to rise from a chair without using arms.  Gait narrow based and stable. Stressed gait intact. There is mild unsteadiness with tandem gait   IMPRESSION: 1. Predominately sensory peripheral neuropathy, partly contributed by his history of alcohol use - now consumes alcohol very seldom for the past 7 years I had extensive discussion with the patient regarding the pathogenesis, etiology, management, and natural course of neuropathy. Neuropathy tends to be slowly progressive, especially if a treatable etiology is not identified.  I would like to test for treatable causes of neuropathy. I discussed that in the vast majority of cases, despite checking for reversible causes, we are unable to find the underlying etiology and management is symptomatic.  EDX showed mild-moderate sensorimotor axonal neuropathy.   - Check heavy metal screen, vitamin B1, copper, ceruloplasmin, SPEP with IFE  - HbA1c, vitamin B12, and TSH is normal  - No role for medications given symptoms are not painful  2.  Atrial fibrillation on anticoagulation therapy  - No signs of sensory ataxia to suggest increased risk of falls at this time  Return to clinic in 6 months.   The duration of this appointment visit was 45 minutes of face-to-face time with the  patient.  Greater than 50% of this time was spent in counseling, explanation of diagnosis, planning of further management, and coordination of care.   Thank you for allowing me to participate in patient's care.  If I can answer any additional questions, I would be pleased to do so.    Sincerely,    Donika K. Posey Pronto, DO

## 2016-06-05 LAB — PROTEIN ELECTROPHORESIS, SERUM
ALPHA-2-GLOBULIN: 0.6 g/dL (ref 0.5–0.9)
Albumin ELP: 4.2 g/dL (ref 3.8–4.8)
Alpha-1-Globulin: 0.3 g/dL (ref 0.2–0.3)
BETA 2: 0.4 g/dL (ref 0.2–0.5)
BETA GLOBULIN: 0.4 g/dL (ref 0.4–0.6)
GAMMA GLOBULIN: 1.1 g/dL (ref 0.8–1.7)
Total Protein, Serum Electrophoresis: 7 g/dL (ref 6.1–8.1)

## 2016-06-05 LAB — IMMUNOFIXATION ELECTROPHORESIS
IGM, SERUM: 73 mg/dL (ref 48–271)
IgA: 268 mg/dL (ref 81–463)
IgG (Immunoglobin G), Serum: 1155 mg/dL (ref 694–1618)

## 2016-06-05 LAB — CERULOPLASMIN: Ceruloplasmin: 21 mg/dL (ref 18–36)

## 2016-06-06 LAB — HEAVY METALS, BLOOD
ARSENIC: 9 ug/L (ref 2–23)
Lead, Blood: NOT DETECTED ug/dL (ref 0–19)
Mercury: NOT DETECTED ug/L (ref 0.0–14.9)

## 2016-06-06 LAB — COPPER, SERUM: Copper: 76 ug/dL (ref 72–166)

## 2016-06-08 LAB — VITAMIN B1: VITAMIN B1 (THIAMINE): 15 nmol/L (ref 8–30)

## 2016-06-20 ENCOUNTER — Encounter (HOSPITAL_BASED_OUTPATIENT_CLINIC_OR_DEPARTMENT_OTHER): Payer: Self-pay | Admitting: *Deleted

## 2016-06-20 NOTE — Progress Notes (Signed)
NPO AFTER MN.  ARRIVE AT 0800 GETTING  LAB WORK DONE Friday 06-21-2016 (CBC, BMET).  CURRENT EKG IN CHART AND EPIC.  WILL TAKE AM MEDS W/ SIPS OF WATER DOS.

## 2016-06-21 DIAGNOSIS — I10 Essential (primary) hypertension: Secondary | ICD-10-CM | POA: Diagnosis not present

## 2016-06-21 DIAGNOSIS — Z7901 Long term (current) use of anticoagulants: Secondary | ICD-10-CM | POA: Diagnosis not present

## 2016-06-21 DIAGNOSIS — G4730 Sleep apnea, unspecified: Secondary | ICD-10-CM | POA: Diagnosis not present

## 2016-06-21 DIAGNOSIS — K219 Gastro-esophageal reflux disease without esophagitis: Secondary | ICD-10-CM | POA: Diagnosis not present

## 2016-06-21 DIAGNOSIS — N471 Phimosis: Secondary | ICD-10-CM | POA: Diagnosis not present

## 2016-06-21 DIAGNOSIS — Z79899 Other long term (current) drug therapy: Secondary | ICD-10-CM | POA: Diagnosis not present

## 2016-06-21 DIAGNOSIS — N481 Balanitis: Secondary | ICD-10-CM | POA: Diagnosis not present

## 2016-06-21 DIAGNOSIS — I4891 Unspecified atrial fibrillation: Secondary | ICD-10-CM | POA: Diagnosis not present

## 2016-06-21 LAB — BASIC METABOLIC PANEL
ANION GAP: 6 (ref 5–15)
BUN: 11 mg/dL (ref 6–20)
CALCIUM: 8.6 mg/dL — AB (ref 8.9–10.3)
CO2: 28 mmol/L (ref 22–32)
Chloride: 106 mmol/L (ref 101–111)
Creatinine, Ser: 1.01 mg/dL (ref 0.61–1.24)
GFR calc Af Amer: >60 mL/min (ref >60)
GLUCOSE: 100 mg/dL — AB (ref 65–99)
Potassium: 4.2 mmol/L (ref 3.5–5.1)
SODIUM: 140 mmol/L (ref 135–145)

## 2016-06-21 LAB — CBC
HCT: 45 % (ref 39.0–52.0)
HEMOGLOBIN: 15.4 g/dL (ref 13.0–17.0)
MCH: 32.8 pg (ref 26.0–34.0)
MCHC: 34.2 g/dL (ref 30.0–36.0)
MCV: 95.9 fL (ref 78.0–100.0)
Platelets: 162 10*3/uL (ref 150–400)
RBC: 4.69 MIL/uL (ref 4.22–5.81)
RDW: 13.4 % (ref 11.5–15.5)
WBC: 5.2 10*3/uL (ref 4.0–10.5)

## 2016-06-27 ENCOUNTER — Ambulatory Visit (HOSPITAL_BASED_OUTPATIENT_CLINIC_OR_DEPARTMENT_OTHER)
Admission: RE | Admit: 2016-06-27 | Discharge: 2016-06-27 | Disposition: A | Discharge status: Home / Self Care | Payer: Medicare Other | Source: Ambulatory Visit | Attending: Urology | Admitting: Urology

## 2016-06-27 ENCOUNTER — Encounter (HOSPITAL_BASED_OUTPATIENT_CLINIC_OR_DEPARTMENT_OTHER): Payer: Self-pay | Admitting: *Deleted

## 2016-06-27 ENCOUNTER — Encounter (HOSPITAL_BASED_OUTPATIENT_CLINIC_OR_DEPARTMENT_OTHER)
Admission: RE | Disposition: A | Discharge status: Home / Self Care | Payer: Self-pay | Source: Ambulatory Visit | Attending: Urology

## 2016-06-27 ENCOUNTER — Ambulatory Visit (HOSPITAL_BASED_OUTPATIENT_CLINIC_OR_DEPARTMENT_OTHER): Payer: Medicare Other | Admitting: Certified Registered"

## 2016-06-27 DIAGNOSIS — I10 Essential (primary) hypertension: Secondary | ICD-10-CM | POA: Insufficient documentation

## 2016-06-27 DIAGNOSIS — Z7901 Long term (current) use of anticoagulants: Secondary | ICD-10-CM | POA: Diagnosis not present

## 2016-06-27 DIAGNOSIS — K219 Gastro-esophageal reflux disease without esophagitis: Secondary | ICD-10-CM | POA: Diagnosis not present

## 2016-06-27 DIAGNOSIS — Z79899 Other long term (current) drug therapy: Secondary | ICD-10-CM | POA: Diagnosis not present

## 2016-06-27 DIAGNOSIS — IMO0001 Reserved for inherently not codable concepts without codable children: Secondary | ICD-10-CM

## 2016-06-27 DIAGNOSIS — N471 Phimosis: Secondary | ICD-10-CM | POA: Insufficient documentation

## 2016-06-27 DIAGNOSIS — I4891 Unspecified atrial fibrillation: Secondary | ICD-10-CM | POA: Diagnosis not present

## 2016-06-27 DIAGNOSIS — G4730 Sleep apnea, unspecified: Secondary | ICD-10-CM | POA: Insufficient documentation

## 2016-06-27 DIAGNOSIS — G4733 Obstructive sleep apnea (adult) (pediatric): Secondary | ICD-10-CM | POA: Diagnosis not present

## 2016-06-27 DIAGNOSIS — I480 Paroxysmal atrial fibrillation: Secondary | ICD-10-CM | POA: Diagnosis not present

## 2016-06-27 DIAGNOSIS — N481 Balanitis: Secondary | ICD-10-CM | POA: Diagnosis not present

## 2016-06-27 HISTORY — DX: Obstructive sleep apnea (adult) (pediatric): G47.33

## 2016-06-27 HISTORY — DX: Personal history of other diseases of the digestive system: Z87.19

## 2016-06-27 HISTORY — DX: Personal history of other diseases of the circulatory system: Z98.890

## 2016-06-27 HISTORY — DX: Personal history of other diseases of the circulatory system: Z86.79

## 2016-06-27 HISTORY — PX: CIRCUMCISION: SHX1350

## 2016-06-27 HISTORY — DX: Long term (current) use of anticoagulants: Z79.01

## 2016-06-27 HISTORY — DX: Personal history of Methicillin resistant Staphylococcus aureus infection: Z86.14

## 2016-06-27 HISTORY — DX: Personal history of colonic polyps: Z86.010

## 2016-06-27 HISTORY — DX: Dependence on other enabling machines and devices: Z99.89

## 2016-06-27 HISTORY — DX: Presence of spectacles and contact lenses: Z97.3

## 2016-06-27 HISTORY — DX: Personal history of adenomatous and serrated colon polyps: Z86.0101

## 2016-06-27 HISTORY — DX: Paroxysmal atrial fibrillation: I48.0

## 2016-06-27 HISTORY — DX: Hereditary and idiopathic neuropathy, unspecified: G60.9

## 2016-06-27 HISTORY — DX: Balanitis: N48.1

## 2016-06-27 SURGERY — CIRCUMCISION, ADULT
Anesthesia: General | Site: Penis

## 2016-06-27 MED ORDER — OXYCODONE HCL 5 MG/5ML PO SOLN
5.0000 mg | Freq: Once | ORAL | Status: DC | PRN
  Filled 2016-06-27: qty 5

## 2016-06-27 MED ORDER — KETOROLAC TROMETHAMINE 30 MG/ML IJ SOLN
INTRAMUSCULAR | Status: AC
  Filled 2016-06-27: qty 1

## 2016-06-27 MED ORDER — OXYCODONE HCL 5 MG PO TABS
5.0000 mg | ORAL_TABLET | Freq: Once | ORAL | Status: DC | PRN
  Filled 2016-06-27: qty 1

## 2016-06-27 MED ORDER — BUPIVACAINE HCL 0.25 % IJ SOLN
INTRAMUSCULAR | Status: DC | PRN
  Administered 2016-06-27: 20 mL

## 2016-06-27 MED ORDER — ONDANSETRON HCL 4 MG/2ML IJ SOLN
INTRAMUSCULAR | Status: AC
  Filled 2016-06-27: qty 2

## 2016-06-27 MED ORDER — FENTANYL CITRATE (PF) 100 MCG/2ML IJ SOLN
INTRAMUSCULAR | Status: AC
  Filled 2016-06-27: qty 2

## 2016-06-27 MED ORDER — CEFAZOLIN SODIUM-DEXTROSE 2-4 GM/100ML-% IV SOLN
INTRAVENOUS | Status: AC
  Filled 2016-06-27: qty 100

## 2016-06-27 MED ORDER — FENTANYL CITRATE (PF) 100 MCG/2ML IJ SOLN
25.0000 ug | INTRAMUSCULAR | Status: DC | PRN
  Filled 2016-06-27: qty 1

## 2016-06-27 MED ORDER — ACETAMINOPHEN 500 MG PO TABS
ORAL_TABLET | ORAL | Status: AC
Start: 2016-06-27 — End: 2016-06-27
  Filled 2016-06-27: qty 2

## 2016-06-27 MED ORDER — ONDANSETRON HCL 4 MG/2ML IJ SOLN
INTRAMUSCULAR | Status: DC | PRN
  Administered 2016-06-27: 4 mg via INTRAVENOUS

## 2016-06-27 MED ORDER — LIDOCAINE 2% (20 MG/ML) 5 ML SYRINGE
INTRAMUSCULAR | Status: DC | PRN
  Administered 2016-06-27: 80 mg via INTRAVENOUS

## 2016-06-27 MED ORDER — ONDANSETRON HCL 4 MG/2ML IJ SOLN
4.0000 mg | Freq: Once | INTRAMUSCULAR | Status: DC | PRN
  Filled 2016-06-27: qty 2

## 2016-06-27 MED ORDER — TRAMADOL-ACETAMINOPHEN 37.5-325 MG PO TABS: 1.0000 | ORAL_TABLET | Freq: Four times a day (QID) | ORAL | 0 refills | Status: DC | PRN

## 2016-06-27 MED ORDER — MIDAZOLAM HCL 2 MG/2ML IJ SOLN
INTRAMUSCULAR | Status: AC
  Filled 2016-06-27: qty 2

## 2016-06-27 MED ORDER — MIDAZOLAM HCL 5 MG/5ML IJ SOLN
INTRAMUSCULAR | Status: DC | PRN
  Administered 2016-06-27: 2 mg via INTRAVENOUS

## 2016-06-27 MED ORDER — KETOROLAC TROMETHAMINE 30 MG/ML IJ SOLN
30.0000 mg | INTRAMUSCULAR | Status: DC
  Administered 2016-06-27: 30 mg via INTRAVENOUS
  Filled 2016-06-27: qty 1

## 2016-06-27 MED ORDER — PROPOFOL 10 MG/ML IV BOLUS
INTRAVENOUS | Status: AC
  Filled 2016-06-27: qty 20

## 2016-06-27 MED ORDER — ACETAMINOPHEN 500 MG PO TABS
1000.0000 mg | ORAL_TABLET | ORAL | Status: AC
  Administered 2016-06-27: 1000 mg via ORAL
  Filled 2016-06-27: qty 2

## 2016-06-27 MED ORDER — DEXAMETHASONE SODIUM PHOSPHATE 4 MG/ML IJ SOLN
INTRAMUSCULAR | Status: DC | PRN
  Administered 2016-06-27: 10 mg via INTRAVENOUS

## 2016-06-27 MED ORDER — DEXAMETHASONE SODIUM PHOSPHATE 10 MG/ML IJ SOLN
INTRAMUSCULAR | Status: AC
  Filled 2016-06-27: qty 1

## 2016-06-27 MED ORDER — SODIUM CHLORIDE 0.9 % IR SOLN
Status: DC | PRN
  Administered 2016-06-27: 500 mL

## 2016-06-27 MED ORDER — CEFAZOLIN SODIUM-DEXTROSE 2-4 GM/100ML-% IV SOLN
2.0000 g | INTRAVENOUS | Status: AC
  Administered 2016-06-27: 2 g via INTRAVENOUS
  Filled 2016-06-27: qty 100

## 2016-06-27 MED ORDER — FENTANYL CITRATE (PF) 100 MCG/2ML IJ SOLN
INTRAMUSCULAR | Status: DC | PRN
  Administered 2016-06-27 (×2): 25 ug via INTRAVENOUS
  Administered 2016-06-27: 50 ug via INTRAVENOUS

## 2016-06-27 MED ORDER — LACTATED RINGERS IV SOLN
INTRAVENOUS | Status: DC
  Administered 2016-06-27: 09:00:00 via INTRAVENOUS
  Filled 2016-06-27: qty 1000

## 2016-06-27 MED ORDER — PROPOFOL 10 MG/ML IV BOLUS
INTRAVENOUS | Status: DC | PRN
  Administered 2016-06-27: 200 mg via INTRAVENOUS

## 2016-06-27 SURGICAL SUPPLY — 34 items
ADH SKN CLS APL DERMABOND .7 (GAUZE/BANDAGES/DRESSINGS)
BANDAGE CO FLEX L/F 2IN X 5YD (GAUZE/BANDAGES/DRESSINGS) ×2 IMPLANT
BLADE CLIPPER SURG (BLADE) ×1 IMPLANT
BLADE SURG 15 STRL LF DISP TIS (BLADE) ×1 IMPLANT
BLADE SURG 15 STRL SS (BLADE) ×2
BNDG CONFORM 2 STRL LF (GAUZE/BANDAGES/DRESSINGS) ×1 IMPLANT
COVER BACK TABLE 60X90IN (DRAPES) ×2 IMPLANT
COVER MAYO STAND STRL (DRAPES) ×2 IMPLANT
DERMABOND ADVANCED (GAUZE/BANDAGES/DRESSINGS)
DERMABOND ADVANCED .7 DNX12 (GAUZE/BANDAGES/DRESSINGS) ×1 IMPLANT
DRAPE LAPAROTOMY 100X72 PEDS (DRAPES) ×2 IMPLANT
ELECT NDL TIP 2.8 STRL (NEEDLE) ×1 IMPLANT
ELECT NEEDLE TIP 2.8 STRL (NEEDLE) ×2 IMPLANT
ELECT REM PT RETURN 9FT ADLT (ELECTROSURGICAL) ×2
ELECTRODE REM PT RTRN 9FT ADLT (ELECTROSURGICAL) ×1 IMPLANT
GAUZE XEROFORM 1X8 LF (GAUZE/BANDAGES/DRESSINGS) ×1 IMPLANT
GLOVE BIO SURGEON STRL SZ7.5 (GLOVE) ×2 IMPLANT
GLOVE INDICATOR 7.5 STRL GRN (GLOVE) ×3 IMPLANT
GOWN STRL REUS W/ TWL LRG LVL3 (GOWN DISPOSABLE) ×1 IMPLANT
GOWN STRL REUS W/ TWL XL LVL3 (GOWN DISPOSABLE) ×1 IMPLANT
GOWN STRL REUS W/TWL LRG LVL3 (GOWN DISPOSABLE) ×3 IMPLANT
GOWN STRL REUS W/TWL XL LVL3 (GOWN DISPOSABLE) ×2
KIT ROOM TURNOVER WOR (KITS) ×2 IMPLANT
NDL HYPO 25X5/8 SAFETYGLIDE (NEEDLE) ×1 IMPLANT
NEEDLE HYPO 25X5/8 SAFETYGLIDE (NEEDLE) ×2 IMPLANT
NS IRRIG 500ML POUR BTL (IV SOLUTION) ×2 IMPLANT
PACK BASIN DAY SURGERY FS (CUSTOM PROCEDURE TRAY) ×2 IMPLANT
PENCIL BUTTON HOLSTER BLD 10FT (ELECTRODE) ×2 IMPLANT
SPONGE GAUZE 4X4 12PLY STER LF (GAUZE/BANDAGES/DRESSINGS) ×1 IMPLANT
SUT MON AB 4-0 PC3 18 (SUTURE) ×6 IMPLANT
SYR CONTROL 10ML LL (SYRINGE) ×2 IMPLANT
TOWEL OR 17X24 6PK STRL BLUE (TOWEL DISPOSABLE) ×4 IMPLANT
TRAY DSU PREP LF (CUSTOM PROCEDURE TRAY) ×2 IMPLANT
WATER STERILE IRR 500ML POUR (IV SOLUTION) ×1 IMPLANT

## 2016-06-27 NOTE — Anesthesia Preprocedure Evaluation (Signed)
Anesthesia Evaluation  Patient identified by MRN, date of birth, ID band Patient awake    Reviewed: Allergy & Precautions, NPO status , Patient's Chart, lab work & pertinent test results  Airway Mallampati: II  TM Distance: >3 FB Neck ROM: Full    Dental  (+) Teeth Intact, Dental Advisory Given   Pulmonary    breath sounds clear to auscultation       Cardiovascular hypertension,  Rhythm:Regular Rate:Normal     Neuro/Psych    GI/Hepatic   Endo/Other    Renal/GU      Musculoskeletal   Abdominal   Peds  Hematology   Anesthesia Other Findings   Reproductive/Obstetrics                             Anesthesia Physical Anesthesia Plan  ASA: III  Anesthesia Plan: General   Post-op Pain Management:    Induction: Intravenous  Airway Management Planned: LMA  Additional Equipment:   Intra-op Plan:   Post-operative Plan:   Informed Consent: I have reviewed the patients History and Physical, chart, labs and discussed the procedure including the risks, benefits and alternatives for the proposed anesthesia with the patient or authorized representative who has indicated his/her understanding and acceptance.   Dental advisory given  Plan Discussed with: CRNA and Anesthesiologist  Anesthesia Plan Comments: (Balanitis H/O Afib S/P ablation now in SR, on Xarelto held since 12/26 Sleep apnea on CPAP htn GERD  Roberts Gaudy)        Anesthesia Quick Evaluation

## 2016-06-27 NOTE — H&P (Signed)
Office Visit Report     05/27/2016   --------------------------------------------------------------------------------   Carlos Thomas  MRN: [id number]  PRIMARY CARE:  Minette Brine, MD  DOB: 1951/05/25, 65 year old Male  REFERRING:  W Azalia Bilis, MD  SSN:   PROVIDER:  Carolan Clines, M.D.    LOCATION:  [facility] [phone]   --------------------------------------------------------------------------------   CC: I have irritation and discomfort of the head of my penis.  HPI: Carlos Thomas is a 65 year-old male patient who was referred by Dr. Minette Brine, MD who is here for irritation and discomfort of the head of his penis.  He first stated noticing pain on approximately 10/30/2015. His symptoms did begin gradually. His symptoms have been better over the last year.   He was not circumcised at birth. He does not have difficulty retracting his foreskin. He has had to use creams on the head of his penis. He does not have diabetes.   He is not currently having trouble urinating.     CC: AUA Questions Scoring.  HPI:     AUA Symptom Score: Less than 50% of the time he has the sensation of not emptying his bladder completely when finished urinating. 50% of the time he has to urinate again fewer than two hours after he has finished urinating. 50% of the time he has to start and stop again several times when he urinates. 50% of the time he finds it difficult to postpone urination. 50% of the time he has a weak urinary stream. He never has to push or strain to begin urination. He has to get up to urinate 1 time from the time he goes to bed until the time he gets up in the morning.   Calculated AUA Symptom Score: 15    IIEF-5 Score: The patient's confidence that he can get an erection is very high. The patient's erections were hard enough for penetration almost always or always. The patient was able to maintain his erection after he had penetrated his partner  almost always or always. During sexual intercouse, it was not difficult to maintain his erection to the completion of intercourse. The patient found sexual intercourse satisfactory almost always or always.   Calculated IIEF-5 Symptom Score: 25    ALLERGIES: None   MEDICATIONS: Omeprazole 20 mg tablet, delayed release  Cartia Xt 240 mg capsule, ext release 24 hr  Desitin  Diclofenac Sodium 1 % gel  Flecainide Acetate 100 mg tablet  L-Arginine 1,000 mg tablet  Xarelto 20 mg tablet     GU PSH: Hernia Repair      PSH Notes: cardiac ablation x2   NON-GU PSH: Cholecystectomy    GU PMH: None   NON-GU PMH: Atrial Fibrillation GERD Sleep Apnea    FAMILY HISTORY: Congestive Heart Failure - Father Death of family member - Father   SOCIAL HISTORY: Marital Status: Divorced Current Smoking Status: Patient has never smoked.  Has never drank.  Drinks 2 caffeinated drinks per day. 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    REVIEW OF SYSTEMS:    GU Review Male:   Patient reports get up at night to urinate, stream starts and stops, frequent urination, and hard to postpone urination. Patient denies burning/ pain with urination, erection problems, leakage of urine, penile pain, have to strain to urinate , and trouble starting your stream.  Gastrointestinal (Upper):   Patient reports indigestion/ heartburn. Patient denies nausea and vomiting.  Gastrointestinal (Lower):  Patient denies diarrhea and constipation.  Constitutional:   Patient denies fever, night sweats, weight loss, and fatigue.  Skin:   Patient reports skin rash/ lesion and itching.   Eyes:   Patient denies blurred vision and double vision.  Ears/ Nose/ Throat:   Patient reports sinus problems. Patient denies sore throat.  Hematologic/Lymphatic:   Patient reports easy bruising. Patient denies swollen glands.  Cardiovascular:   Patient denies leg swelling and chest pains.  Respiratory:   Patient  reports cough. Patient denies shortness of breath.  Endocrine:   Patient denies excessive thirst.  Musculoskeletal:   Patient reports joint pain. Patient denies back pain.  Neurological:   Patient denies headaches and dizziness.  Psychologic:   Patient denies depression and anxiety.   VITAL SIGNS:      05/27/2016 02:43 PM  Weight 230 lb / 104.33 kg  Height 71 in / 180.34 cm  BP 128/68 mmHg  Pulse 62 /min  Temperature 98.0 F / 37 C  BMI 32.1 kg/m   GU PHYSICAL EXAMINATION:    Anus and Perineum: No hemorrhoids. No anal stenosis. No rectal fissure, no anal fissure. No edema, no dimple, no perineal tenderness, no anal tenderness.  Scrotum: No lesions. No edema. No cysts. No warts.  Epididymides: Right: no spermatocele, no masses, no cysts, no tenderness, no induration, no enlargement. Left: no spermatocele, no masses, no cysts, no tenderness, no induration, no enlargement.  Testes: No tenderness, no swelling, no enlargement left testes. No tenderness, no swelling, no enlargement right testes. Normal location left testes. Normal location right testes. No mass, no cyst, no varicocele, no hydrocele left testes. No mass, no cyst, no varicocele, no hydrocele right testes.  Urethral Meatus: Normal size. No lesion, no wart, no discharge, no polyp. Normal location.  Penis: Penis uncircumcised, phimosis, cracks and fissures. Balanitis. No foreskin warts. No dorsal peyronie's plaques, no left corporal peyronie's plaques, no right corporal peyronie's plaques, no scarring, no shaft warts. No meatal stenosis.    MULTI-SYSTEM PHYSICAL EXAMINATION:    Constitutional: Well-nourished. No physical deformities. Normally developed. Good grooming.  Neck: Neck symmetrical, not swollen. Normal tracheal position.  Respiratory: No labored breathing, no use of accessory muscles.   Cardiovascular: Normal temperature, normal extremity pulses, no swelling, no varicosities.  Lymphatic: No enlargement of neck, axillae,  groin.  Skin: No paleness, no jaundice, no cyanosis. No lesion, no ulcer, no rash.  Neurologic / Psychiatric: Oriented to time, oriented to place, oriented to person. No depression, no anxiety, no agitation.  Gastrointestinal: No mass, no tenderness, no rigidity, non obese abdomen.  Eyes: Normal conjunctivae. Normal eyelids.  Ears, Nose, Mouth, and Throat: Left ear no scars, no lesions, no masses. Right ear no scars, no lesions, no masses. Nose no scars, no lesions, no masses. Normal hearing. Normal lips.  Musculoskeletal: Normal gait and station of head and neck.     PAST DATA REVIEWED:  Source Of History:  Patient  Records Review:   AUA Symptom Score   PROCEDURES: None   ASSESSMENT:      ICD-10 Details  1 GU:   Balanitis - N48.1   2   Balanoposthitis - N47.6    PLAN:           Schedule Return Visit: ASAP - Schedule Surgery          Document Letter(s):  Created for Patient: Clinical Summary         Notes:   cortisone/chlortrimazole cream to glans ( [facility]): verbal script  Schedule circumcision.   Dr. Ottie Glazier, [facility]     Signed by Carolan Clines, M.D. on 05/27/16 at 5:41 PM (EST)     The information contained in this medical record document is considered private and confidential patient information. This information can only be used for the medical diagnosis and/or medical services that are being provided by the patient's selected caregivers. This information can only be distributed outside of the patient's care if the patient agrees and signs waivers of authorization for this information to be sent to an outside source or route.

## 2016-06-27 NOTE — Discharge Instructions (Addendum)
Post Anesthesia Home Care Instructions  Activity: Get plenty of rest for the remainder of the day. A responsible adult should stay with you for 24 hours following the procedure.  For the next 24 hours, DO NOT: -Drive a car -Paediatric nurse -Drink alcoholic beverages -Take any medication unless instructed by your physician -Make any legal decisions or sign important papers.  Meals: Start with liquid foods such as gelatin or soup. Progress to regular foods as tolerated. Avoid greasy, spicy, heavy foods. If nausea and/or vomiting occur, drink only clear liquids until the nausea and/or vomiting subsides. Call your physician if vomiting continues.  Special Instructions/Symptoms: Your throat may feel dry or sore from the anesthesia or the breathing tube placed in your throat during surgery. If this causes discomfort, gargle with warm salt water. The discomfort should disappear within 24 hours.  If you had a scopolamine patch placed behind your ear for the management of post- operative nausea and/or vomiting:  1. The medication in the patch is effective for 72 hours, after which it should be removed.  Wrap patch in a tissue and discard in the trash. Wash hands thoroughly with soap and water. 2. You may remove the patch earlier than 72 hours if you experience unpleasant side effects which may include dry mouth, dizziness or visual disturbances. 3. Avoid touching the patch. Wash your hands with soap and water after contact with the patch.   Circumcision, Adult, Care After These instructions give you information on caring for yourself after your procedure. Your doctor may also give you more specific instructions. Call your doctor if you have any problems or questions after your procedure. Follow these instructions at home:  Only take medicine as told by your doctor.  Any bandages (dressings) should stay on for at least 24 hours.  You may take the bandages off at night to let air get to the  area where your doctor made a cut (incision site). Once a scab forms over the cut, you will not need to use bandages.  Carefully remove the bandage if it gets dirty. Apply medicated cream to the cut. Carefully put a new bandage on if a scab has not formed.  Do not have sex until your doctor says it is okay.  Do not get your cut wet for 24 hours or as told by your doctor.  You may take a sponge bath. Clean around the cut gently with mild soap and water.  You may take a shower after 24 hours or as told by your doctor. Do not take a tub bath. After you shower, gently pat your cut dry. Do not rub it.  Avoid heavy lifting.  Avoid contact sports, biking, or swimming until you have healed. This usually takes 10-14 days. Contact a doctor if:  You have pain that does not go away after you take medicine for it.  You have puffiness (swelling) or redness that is unexpected.  You have a fever. Get help right away if:  You cannot pee (urinate).  You have pain when you pee.  Your pain is not helped by medicines.  There is redness, puffiness, and soreness spreading up the shaft of your penis, your thighs, or your lower belly (abdomen).  There is yellowish-white fluid (pus) coming from your cut.  You have bleeding that does not stop when you press on it. This information is not intended to replace advice given to you by your health care provider. Make sure you discuss any questions you have with  your health care provider. Document Released: 12/04/2007 Document Revised: 11/23/2015 Document Reviewed: 02/11/2013 Elsevier Interactive Patient Education  2017 [employer].

## 2016-06-27 NOTE — Interval H&P Note (Signed)
History and Physical Interval Note:  06/27/2016 9:17 AM  Carlos Thomas  has presented today for surgery, with the diagnosis of balantitis  The various methods of treatment have been discussed with the patient and family. After consideration of risks, benefits and other options for treatment, the patient has consented to  Procedure(s): CIRCUMCISION ADULT (N/A) as a surgical intervention .  The patient's history has been reviewed, patient examined, no change in status, stable for surgery.  I have reviewed the patient's chart and labs.  Questions were answered to the patient's satisfaction.     Melisse Caetano I Azarion Hove

## 2016-06-27 NOTE — Anesthesia Postprocedure Evaluation (Signed)
Anesthesia Post Note  Patient: Carlos Thomas  Procedure(s) Performed: Procedure(s) (LRB): CIRCUMCISION ADULT (N/A)  Patient location during evaluation: PACU Anesthesia Type: General Level of consciousness: awake, awake and alert and oriented Pain management: pain level controlled Vital Signs Assessment: post-procedure vital signs reviewed and stable Respiratory status: spontaneous breathing, nonlabored ventilation and respiratory function stable Cardiovascular status: blood pressure returned to baseline Postop Assessment: no headache Anesthetic complications: no       Last Vitals:  Vitals:   06/27/16 1115 06/27/16 1136  BP: (!) 115/56 120/68  Pulse: (!) 47 (!) 47  Resp: 12 16  Temp:  36.4 C    Last Pain:  Vitals:   06/27/16 1136  TempSrc: Oral  PainSc: 0-No pain                 Garwood Wentzell COKER

## 2016-06-27 NOTE — Op Note (Signed)
Pre-operative diagnosis :  Phimosis  Postoperative diagnosis: Same  Operation: Circumcision  Surgeon:  S. Gaynelle Arabian, MD  First assistant: None  Anesthesia:  Gen. LMA  Preparation: After appropriate preanesthesia, the patient was brought the operating room, placed on the operating table in the dorsal supine position where general LMA anesthesia was introduced. He remain in this position, where the penis was prepped with Betadine solution and draped in usual fashion.  Review history:  Carlos Thomas is a 65 year-old male patient who was referred by Dr. Minette Brine, MD who is here for irritation and discomfort of the head of his penis.  He first stated noticing pain on approximately 10/30/2015. His symptoms did begin gradually. His symptoms have been better over the last year.   He was not circumcised at birth. He does not have difficulty retracting his foreskin. He has had to use creams on the head of his penis. He does not have diabetes.   He is not currently having trouble urinating.    Statement of  Likelihood of Success: Excellent. TIME-OUT observed.:  Procedure: Using a marking pen, the peri-coronal, and periglandular tissues were marked. 0.25% Marcaine solution was then injected at the base of the penis, and also circumferentially around the base of the penis, to afford a penile block. A total of 20 mL of local anesthetic was used.  Following this, circumcising incisions were made, following the marking lines. Using Metzenbaum scissors, the foreskin was incised, and using the electrosurgical unit, the foreskin was removed and discarded. Bleeding points were electrocoagulated.  Using 4-0 Monocryl suture, 4 separate quadrants were created, and each quadrant was closed with interrupted simple Monocryl suture. Following closure, the wound was sealed with Dermabond, and a sterile dressing was applied using Coban band. Following this, mesh pants were placed. The patient received IV  Toradol. He received IV antibiotic at the beginning of the procedure. He was awakened, and taken to recovery room in good condition.

## 2016-06-27 NOTE — Transfer of Care (Signed)
Immediate Anesthesia Transfer of Care Note  Patient: Carlos Thomas  Procedure(s) Performed: Procedure(s) (LRB): CIRCUMCISION ADULT (N/A)  Patient Location: PACU  Anesthesia Type: General  Level of Consciousness: awake, oriented, sedated and patient cooperative  Airway & Oxygen Therapy: Patient Spontanous Breathing and Patient connected to face mask oxygen  Post-op Assessment: Report given to PACU RN and Post -op Vital signs reviewed and stable  Post vital signs: Reviewed and stable  Complications: No apparent anesthesia complications  Last Vitals:  Vitals:   06/27/16 0825 06/27/16 1015  BP: 138/71 (P) 128/72  Pulse: 60 (!) (P) 59  Resp: 18 (P) 12  Temp: 36.4 C (P) 36.4 C

## 2016-06-27 NOTE — Anesthesia Procedure Notes (Signed)
Procedure Name: LMA Insertion Date/Time: 06/27/2016 9:16 AM Performed by: Denna Haggard D Pre-anesthesia Checklist: Patient identified, Emergency Drugs available, Suction available and Patient being monitored Patient Re-evaluated:Patient Re-evaluated prior to inductionOxygen Delivery Method: Circle system utilized Preoxygenation: Pre-oxygenation with 100% oxygen Intubation Type: IV induction Ventilation: Mask ventilation without difficulty LMA: LMA inserted LMA Size: 4.0 Number of attempts: 1 Airway Equipment and Method: Bite block Placement Confirmation: positive ETCO2 Tube secured with: Tape Dental Injury: Teeth and Oropharynx as per pre-operative assessment

## 2016-06-28 ENCOUNTER — Encounter (HOSPITAL_BASED_OUTPATIENT_CLINIC_OR_DEPARTMENT_OTHER): Payer: Self-pay | Admitting: Urology

## 2016-08-06 ENCOUNTER — Ambulatory Visit (HOSPITAL_COMMUNITY)
Admission: RE | Admit: 2016-08-06 | Discharge: 2016-08-06 | Disposition: A | Discharge status: Home / Self Care | Payer: Medicare Other | Source: Ambulatory Visit | Attending: Nurse Practitioner | Admitting: Nurse Practitioner

## 2016-08-06 ENCOUNTER — Encounter (HOSPITAL_COMMUNITY): Payer: Self-pay | Admitting: Nurse Practitioner

## 2016-08-06 VITALS — BP 126/72 | HR 52 | Ht 71.0 in | Wt 239.0 lb

## 2016-08-06 DIAGNOSIS — Z8249 Family history of ischemic heart disease and other diseases of the circulatory system: Secondary | ICD-10-CM | POA: Diagnosis not present

## 2016-08-06 DIAGNOSIS — I270 Primary pulmonary hypertension: Secondary | ICD-10-CM

## 2016-08-06 DIAGNOSIS — E669 Obesity, unspecified: Secondary | ICD-10-CM | POA: Diagnosis not present

## 2016-08-06 DIAGNOSIS — I481 Persistent atrial fibrillation: Secondary | ICD-10-CM

## 2016-08-06 DIAGNOSIS — R001 Bradycardia, unspecified: Secondary | ICD-10-CM | POA: Insufficient documentation

## 2016-08-06 DIAGNOSIS — G4733 Obstructive sleep apnea (adult) (pediatric): Secondary | ICD-10-CM | POA: Insufficient documentation

## 2016-08-06 DIAGNOSIS — I10 Essential (primary) hypertension: Secondary | ICD-10-CM | POA: Insufficient documentation

## 2016-08-06 DIAGNOSIS — K219 Gastro-esophageal reflux disease without esophagitis: Secondary | ICD-10-CM | POA: Insufficient documentation

## 2016-08-06 DIAGNOSIS — Z6839 Body mass index (BMI) 39.0-39.9, adult: Secondary | ICD-10-CM | POA: Diagnosis not present

## 2016-08-06 DIAGNOSIS — Z7901 Long term (current) use of anticoagulants: Secondary | ICD-10-CM | POA: Insufficient documentation

## 2016-08-06 DIAGNOSIS — G629 Polyneuropathy, unspecified: Secondary | ICD-10-CM | POA: Diagnosis not present

## 2016-08-06 DIAGNOSIS — I4819 Other persistent atrial fibrillation: Secondary | ICD-10-CM

## 2016-08-06 NOTE — Progress Notes (Signed)
Primary Care Physician: Shirline Frees, MD Primary Cardiologist: Marlou Porch Sleep: Radford Pax Primary Electrophysiologist: Laney Potash is a 66 y.o. male with a history of persistent atrial fibrillation who presents for follow up in the [facility].  Since last being seen in clinic, the patient reports doing very well.  Today, he  denies symptoms of palpitations, chest pain, shortness of breath, orthopnea, PND, lower extremity edema, dizziness, presyncope, syncope, snoring, daytime somnolence, bleeding, or neurologic sequela. The patient is tolerating medications without difficulties and is otherwise without complaint today.    Atrial Fibrillation Risk Factors:  he does have symptoms or diagnosis of sleep apnea. he is compliant with CPAP therapy.  he does not have a history of rheumatic fever.  he does not have a history of alcohol use.  he has a BMI of Body mass index is 33.33 kg/m.Marland Kitchen Filed Weights   08/06/16 0858  Weight: 239 lb (108.4 kg)    LA size: 41   Atrial Fibrillation Management history:  Previous antiarrhythmic drugs: Flecainide  Previous cardioversions: 01/2010; 08/2015  Previous ablations: 2010, 2013  CHADS2VASC score: 2  Anticoagulation history: Xarelto   Past Medical History:  Diagnosis Date  . Anticoagulant long-term use    xarelto  . Balanitis   . GERD (gastroesophageal reflux disease)   . History of adenomatous polyp of colon    2006  tubular adenoma's and hyperplastic polyp's;   2011  tubular adenoma  . History of atrial flutter    s/p ablation atrial flutter/atrial fib in 2010  . History of MRSA infection    1990's  infected wound near surgerical area   . History of repair of hiatal hernia   . HTN (hypertension)   . Obesity (BMI 30-39.9)   . OSA on CPAP    Mild OSA  per study 05-07-2012-- w AHI 6.57/hr now on 13cm H2O  . PAF (paroxysmal atrial fibrillation) [facility]) primary cardiologist-  dr Marlou Porch   ep  cardiologist-  dr Rayann Heman---  s/p  ablation afib and aflutter 2010  and  ablation afib/svt  2013  . Peripheral neuropathy, hereditary/idiopathic    bilateral feet---  neurologist- dr patel  . 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 S/P radiofrequency ablation operation for arrhythmia    01-09-2009  cardiac ablation afib/flutter;   09-13-2011  ablation afib/svt and in ther right atrium  . Wears glasses    Past Surgical History:  Procedure Laterality Date  . ATRIAL FIBRILLATION ABLATION N/A 09/13/2011   Procedure: ATRIAL FIBRILLATION ABLATION;  Surgeon: Thompson Grayer, MD;  Location: [facility] CATH LAB;  Service: Cardiovascular;  Laterality: N/A;  . CARDIAC ELECTROPHYSIOLOGY MAPPING AND ABLATION  [phone]   dr allred   successful ablation atrial fibrillation /  atrial flutter  . CARDIAC ELECTROPHYSIOLOGY MAPPING AND ABLATION  09-13-2011   dr Rayann Heman   afib mapping/ ablation and  mapping/ablation in the right atrium  . CARDIOVASCULAR STRESS TEST  09/2006   per dr Marlou Porch note nuclear study normal w/ no ischemia  . CARDIOVERSION N/A 09/21/2015   Procedure: CARDIOVERSION;  Surgeon: Lelon Perla, MD;  Location: [facility] ENDOSCOPY;  Service: Cardiovascular;  Laterality: N/A;  . CIRCUMCISION N/A 06/27/2016   Procedure: CIRCUMCISION ADULT;  Surgeon: Carolan Clines, MD;  Location: [facility];  Service: Urology;  Laterality: N/A;  . COLONOSCOPY  last one 02/  2011  . DIRECT CURRENT CARDIOVERSION  02/02/2010   dr Leonia Reeves  . LAPAROSCOPIC  CHOLECYSTECTOMY  1990's  . OPEN HIATAL HERNIA REPAIR  1970's  . TEE WITHOUT CARDIOVERSION  09/12/2011   Procedure: TRANSESOPHAGEAL ECHOCARDIOGRAM (TEE);  Surgeon: Jettie Booze, MD;  Location: [facility] ENDOSCOPY;  Service: Cardiovascular;  Laterality: N/A;  trace AR, no LA/LAA thrombus, normal LVfunction, moderate descending aortic atherosclerosis  . TEE WITHOUT CARDIOVERSION N/A 09/21/2015   Procedure: TRANSESOPHAGEAL  ECHOCARDIOGRAM (TEE);  Surgeon: Lelon Perla, MD;  Location: [facility] ENDOSCOPY;  Service: Cardiovascular;  Laterality: N/A; normal LV systolic funciton,  moderate LAE,  no LAA thrombus,  trace MR and TR  . TRANSTHORACIC ECHOCARDIOGRAM  06/03/2014   ef 55-60%/  trivial MR and TR    Current Outpatient Prescriptions  Medication Sig Dispense Refill  . Amino Acids (AMINO ACID PO) Take by mouth daily.    Marland Kitchen CARTIA XT 240 MG 24 hr capsule TAKE 1 CAPSULE(240 MG) BY MOUTH DAILY (Patient taking differently: TAKE 1 CAPSULE(240 MG) BY MOUTH DAILY--- takes in am) 30 capsule 11  . flecainide (TAMBOCOR) 100 MG tablet Take 1 tablet (100 mg total) by mouth 2 (two) times daily. 180 tablet 3  . NONFORMULARY OR COMPOUNDED ITEM Apply topically 2 (two) times daily. Per Dr. Gaynelle Arabian prescription apply to head of penis bid    . omeprazole (PRILOSEC) 20 MG capsule Take 20 mg by mouth every morning.     . rivaroxaban (XARELTO) 20 MG TABS tablet Take 1 tablet (20 mg total) by mouth daily with supper. 30 tablet 11  . tamsulosin (FLOMAX) 0.4 MG CAPS capsule Take 0.4 mg by mouth daily.     No current facility-administered medications for this encounter.     No Known Allergies  Social History   Social History  . Marital status: Divorced    Spouse name: N/A  . Number of children: N/A  . Years of education: N/A   Occupational History  . Not on file.   Social History Main Topics  . Smoking status: Never Smoker  . Smokeless tobacco: Never Used  . Alcohol use No     Comment: rare  . Drug use: No  . Sexual activity: Not on file   Other Topics Concern  . 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  Education: college.    Family History  Problem Relation Age of Onset  . CAD Father   . Heart attack Father   . Atrial fibrillation Neg Hx   . Anesthesia problems Neg Hx     ROS- All systems are  reviewed and negative except as per the HPI above.  Physical Exam: Vitals:   08/06/16 0858  BP: 126/72  Pulse: (!) 52  Weight: 239 lb (108.4 kg)  Height: 5\' 11"  (1.803 m)    GEN- The patient is well appearing, alert and oriented x 3 today.   Head- normocephalic, atraumatic Eyes-  Sclera clear, conjunctiva pink Ears- hearing intact Oropharynx- clear Neck- supple  Lungs- Clear to ausculation bilaterally, normal work of breathing Heart- Regular rate and rhythm, no murmurs, rubs or gallops  GI- soft, NT, ND, + BS Extremities- no clubbing, cyanosis, or edema MS- no significant deformity or atrophy Skin- no rash or lesion Psych- euthymic mood, full affect Neuro- strength and sensation are intact  Wt Readings from Last 3 Encounters:  08/06/16 239 lb (108.4 kg)  06/27/16 231 lb 8 oz (105 kg)  06/03/16 232 lb 2  oz (105.3 kg)    EKG today demonstrates sinus brady, rate 52, QRS 151msec, PR 118msec Echo 05/2014 demonstrated EF 55-60%, LA 41  Epic records are reviewed at length today  Assessment and Plan:  1. Persistent atrial fibrillation Doing well on Flecainide and Cardizem He asks today about decreasing Flecainide dose but wants to avoid repeat DCCV or ablation if able. Since he is doing well on current regimen, we have not made changes today Continue Xarelto for CHADS2VASC of 2 Recent BMET, CBC stable  2. Obstructive sleep apnea The importance of adequate treatment of sleep apnea was discussed today in order to improve our ability to maintain sinus rhythm long term. The patient reports compliance with CPAP  Follows with Dr Radford Pax - due for follow up in June  3.  HTN Stable No change required today  Follow up in AF clinic in 3 months   Chanetta Marshall, NP 08/06/2016 9:29 AM

## 2016-09-03 ENCOUNTER — Encounter (HOSPITAL_COMMUNITY): Payer: Self-pay | Admitting: *Deleted

## 2016-10-20 ENCOUNTER — Encounter (HOSPITAL_BASED_OUTPATIENT_CLINIC_OR_DEPARTMENT_OTHER): Payer: Self-pay

## 2016-10-20 ENCOUNTER — Emergency Department (HOSPITAL_BASED_OUTPATIENT_CLINIC_OR_DEPARTMENT_OTHER)
Admission: EM | Admit: 2016-10-20 | Discharge: 2016-10-20 | Disposition: A | Discharge status: Home / Self Care | Payer: Medicare Other | Attending: Emergency Medicine | Admitting: Emergency Medicine

## 2016-10-20 ENCOUNTER — Emergency Department (HOSPITAL_BASED_OUTPATIENT_CLINIC_OR_DEPARTMENT_OTHER): Payer: Medicare Other

## 2016-10-20 DIAGNOSIS — R05 Cough: Secondary | ICD-10-CM | POA: Diagnosis not present

## 2016-10-20 DIAGNOSIS — I10 Essential (primary) hypertension: Secondary | ICD-10-CM | POA: Insufficient documentation

## 2016-10-20 DIAGNOSIS — R059 Cough, unspecified: Secondary | ICD-10-CM

## 2016-10-20 DIAGNOSIS — I480 Paroxysmal atrial fibrillation: Secondary | ICD-10-CM | POA: Diagnosis not present

## 2016-10-20 DIAGNOSIS — Z79899 Other long term (current) drug therapy: Secondary | ICD-10-CM | POA: Insufficient documentation

## 2016-10-20 DIAGNOSIS — I4891 Unspecified atrial fibrillation: Secondary | ICD-10-CM

## 2016-10-20 DIAGNOSIS — R002 Palpitations: Secondary | ICD-10-CM | POA: Diagnosis present

## 2016-10-20 LAB — BASIC METABOLIC PANEL
ANION GAP: 10 (ref 5–15)
BUN: 10 mg/dL (ref 6–20)
CHLORIDE: 101 mmol/L (ref 101–111)
CO2: 24 mmol/L (ref 22–32)
Calcium: 8.6 mg/dL — ABNORMAL LOW (ref 8.9–10.3)
Creatinine, Ser: 1.05 mg/dL (ref 0.61–1.24)
GFR calc non Af Amer: >60 mL/min (ref >60)
Glucose, Bld: 196 mg/dL — ABNORMAL HIGH (ref 65–99)
Potassium: 4 mmol/L (ref 3.5–5.1)
Sodium: 135 mmol/L (ref 135–145)

## 2016-10-20 LAB — CBC
HCT: 46.7 % (ref 39.0–52.0)
Hemoglobin: 15.7 g/dL (ref 13.0–17.0)
MCH: 32.6 pg (ref 26.0–34.0)
MCHC: 33.6 g/dL (ref 30.0–36.0)
MCV: 97.1 fL (ref 78.0–100.0)
PLATELETS: 158 10*3/uL (ref 150–400)
RBC: 4.81 MIL/uL (ref 4.22–5.81)
RDW: 13 % (ref 11.5–15.5)
WBC: 8.9 10*3/uL (ref 4.0–10.5)

## 2016-10-20 LAB — PROTIME-INR
INR: 1.26
Prothrombin Time: 15.9 seconds — ABNORMAL HIGH (ref 11.4–15.2)

## 2016-10-20 LAB — TROPONIN I: Troponin I: <0.03 ng/mL (ref <0.03)

## 2016-10-20 MED ORDER — DILTIAZEM LOAD VIA INFUSION
10.0000 mg | Freq: Once | INTRAVENOUS | Status: AC
  Administered 2016-10-20: 10 mg via INTRAVENOUS
  Filled 2016-10-20: qty 10

## 2016-10-20 MED ORDER — DOXYCYCLINE HYCLATE 100 MG PO CAPS: 100.0000 mg | ORAL_CAPSULE | Freq: Two times a day (BID) | ORAL | 0 refills | Status: DC

## 2016-10-20 MED ORDER — PROPOFOL 1000 MG/100ML IV EMUL
INTRAVENOUS | Status: AC
  Filled 2016-10-20: qty 100

## 2016-10-20 MED ORDER — PROPOFOL 10 MG/ML IV BOLUS
40.0000 mg | Freq: Once | INTRAVENOUS | Status: DC
  Filled 2016-10-20: qty 20

## 2016-10-20 MED ORDER — PROPOFOL 1000 MG/100ML IV EMUL
INTRAVENOUS | Status: AC | PRN
  Administered 2016-10-20: 40 ug/kg/min via INTRAVENOUS

## 2016-10-20 MED ORDER — DILTIAZEM HCL 100 MG IV SOLR
5.0000 mg/h | INTRAVENOUS | Status: DC
  Administered 2016-10-20: 10 mg/h via INTRAVENOUS
  Filled 2016-10-20: qty 100

## 2016-10-20 MED ORDER — PROPOFOL 1000 MG/100ML IV EMUL
5.0000 ug/kg/min | Freq: Once | INTRAVENOUS | Status: AC
  Administered 2016-10-20: 40 ug/kg/min via INTRAVENOUS

## 2016-10-20 MED ORDER — HYDROCODONE-HOMATROPINE 5-1.5 MG/5ML PO SYRP: 5.0000 mL | ORAL_SOLUTION | Freq: Four times a day (QID) | ORAL | 0 refills | Status: DC | PRN

## 2016-10-20 NOTE — Discharge Instructions (Signed)
Doxycycline as prescribed.  Hycodan as prescribed as needed for cough.  Follow up with your cardiologist next week.

## 2016-10-20 NOTE — ED Notes (Signed)
Consent signed and verified.

## 2016-10-20 NOTE — ED Triage Notes (Signed)
Pt reports feeling palpitations since this morning. Has taken cardizem and 2 flecanide. Reports being treated this week for URI & cough this week. Denies shortness of breath, reports cough.

## 2016-10-20 NOTE — ED Notes (Signed)
Pt alert, oriented and talking. VSS.

## 2016-10-20 NOTE — ED Provider Notes (Signed)
Blood pressure 117/72, pulse 63, temperature (!) 100.5 F (38.1 C), resp. rate (!) 24, height 5\' 11"  (1.803 m), weight 235 lb (106.6 kg), SpO2 98 %.  Assuming care from Dr. Stark Jock.  In short, Carlos Thomas is a 66 y.o. male with a chief complaint of Palpitations .  Refer to the original H&P for additional details.  The current plan of care is to follow up after sedation and cardioversion.  05:30 PM Patient is awake, alert, sitting on the edge of the bed. He is drinking fluids and doing well. Review the monitor he remains in a sinus rhythm. Patient is on Xarelto at home. Will follow with Cardiology in the AM.   Nanda Quinton, MD    Margette Fast, MD 10/20/16 [phone]

## 2016-10-20 NOTE — ED Notes (Signed)
O2 decreased to 2l/m, SpO2 97%

## 2016-10-20 NOTE — ED Provider Notes (Signed)
[facility] DEPT [facility] Provider Note   CSN: [id number] Arrival date & time: 10/20/16  1402     History   Chief Complaint Chief Complaint  Patient presents with  . Palpitations    HPI Carlos Thomas is a 66 y.o. male.  Patient is a 66 year old male with history of paroxysmal atrial fibrillation status post ablation currently taking Xarelto. He presents today for evaluation of palpitations. He reports upper respiratory symptoms for the past week. He was prescribed cough medication which has not helped. He reports doing any good bit of coughing this morning, developed palpitations and what he believes his A. fib. He denies any chest pain or shortness of breath. He took his Cardizem as well as a dose of flank and night at home, however continues with rapid, irregular heartbeat.   The history is provided by the patient.  Palpitations   This is a new problem. Episode onset: 6 hours ago. The problem occurs constantly. The problem has not changed since onset.Pertinent negatives include no fever, no chest pain and no shortness of breath. He has tried nothing for the symptoms.    Past Medical History:  Diagnosis Date  . Anticoagulant long-term use    xarelto  . Balanitis   . GERD (gastroesophageal reflux disease)   . History of adenomatous polyp of colon    2006  tubular adenoma's and hyperplastic polyp's;   2011  tubular adenoma  . History of atrial flutter    s/p ablation atrial flutter/atrial fib in 2010  . History of MRSA infection    1990's  infected wound near surgerical area   . History of repair of hiatal hernia   . HTN (hypertension)   . Obesity (BMI 30-39.9)   . OSA on CPAP    Mild OSA  per study 05-07-2012-- w AHI 6.57/hr now on 13cm H2O  . PAF (paroxysmal atrial fibrillation) [facility]) primary cardiologist-  dr Marlou Porch   ep cardiologist-  dr Rayann Heman---  s/p  ablation afib and aflutter 2010  and  ablation afib/svt  2013  . Peripheral neuropathy, hereditary/idiopathic    bilateral feet---  neurologist- dr patel  . 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 S/P radiofrequency ablation operation for arrhythmia    01-09-2009  cardiac ablation afib/flutter;   09-13-2011  ablation afib/svt and in ther right atrium  . Wears glasses     Patient Active Problem List   Diagnosis Date Noted  . Hereditary and idiopathic peripheral neuropathy 06/03/2016  . URI (upper respiratory infection) 05/27/2014  . Obesity (BMI 30-39.9)   . OSA (obstructive sleep apnea) 06/11/2013  . Essential hypertension 02/07/2009  . PAF (paroxysmal atrial fibrillation) ([place]) 02/07/2009  . Atrial flutter ([place]) 02/07/2009    Past Surgical History:  Procedure Laterality Date  . ATRIAL FIBRILLATION ABLATION N/A 09/13/2011   Procedure: ATRIAL FIBRILLATION ABLATION;  Surgeon: Thompson Grayer, MD;  Location: [facility] CATH LAB;  Service: Cardiovascular;  Laterality: N/A;  . CARDIAC ELECTROPHYSIOLOGY MAPPING AND ABLATION  [phone]   dr allred   successful ablation atrial fibrillation /  atrial flutter  . CARDIAC ELECTROPHYSIOLOGY MAPPING AND ABLATION  09-13-2011   dr Rayann Heman   afib mapping/ ablation and  mapping/ablation in the right atrium  . CARDIOVASCULAR STRESS TEST  09/2006   per dr Marlou Porch note nuclear study normal w/ no ischemia  . CARDIOVERSION N/A 09/21/2015   Procedure: CARDIOVERSION;  Surgeon: Lelon Perla, MD;  Location: [facility];  Service: Cardiovascular;  Laterality:  N/A;  . CIRCUMCISION N/A 06/27/2016   Procedure: CIRCUMCISION ADULT;  Surgeon: Carolan Clines, MD;  Location: [facility];  Service: Urology;  Laterality: N/A;  . COLONOSCOPY  last one 02/  2011  . DIRECT CURRENT CARDIOVERSION  02/02/2010   dr Leonia Reeves  . LAPAROSCOPIC CHOLECYSTECTOMY  1990's  . OPEN HIATAL HERNIA REPAIR  1970's  . TEE WITHOUT CARDIOVERSION  09/12/2011   Procedure: TRANSESOPHAGEAL ECHOCARDIOGRAM (TEE);  Surgeon: Jettie Booze, MD;  Location: [facility]  ENDOSCOPY;  Service: Cardiovascular;  Laterality: N/A;  trace AR, no LA/LAA thrombus, normal LVfunction, moderate descending aortic atherosclerosis  . TEE WITHOUT CARDIOVERSION N/A 09/21/2015   Procedure: TRANSESOPHAGEAL ECHOCARDIOGRAM (TEE);  Surgeon: Lelon Perla, MD;  Location: [facility] ENDOSCOPY;  Service: Cardiovascular;  Laterality: N/A; normal LV systolic funciton,  moderate LAE,  no LAA thrombus,  trace MR and TR  . TRANSTHORACIC ECHOCARDIOGRAM  06/03/2014   ef 55-60%/  trivial MR and TR       Home Medications    Prior to Admission medications   Medication Sig Start Date End Date Taking? Authorizing Provider  Amino Acids (AMINO ACID PO) Take by mouth daily.    Historical Provider, MD  CARTIA XT 240 MG 24 hr capsule TAKE 1 CAPSULE(240 MG) BY MOUTH DAILY Patient taking differently: TAKE 1 CAPSULE(240 MG) BY MOUTH DAILY--- takes in am 03/26/16   Jerline Pain, MD  flecainide (TAMBOCOR) 100 MG tablet Take 1 tablet (100 mg total) by mouth 2 (two) times daily. 05/27/16   Thompson Grayer, MD  NONFORMULARY OR COMPOUNDED ITEM Apply topically 2 (two) times daily. Per Dr. Gaynelle Arabian prescription apply to head of penis bid    Historical Provider, MD  omeprazole (PRILOSEC) 20 MG capsule Take 20 mg by mouth every morning.     Historical Provider, MD  rivaroxaban (XARELTO) 20 MG TABS tablet Take 1 tablet (20 mg total) by mouth daily with supper. 09/18/15   Imogene Burn, PA-C  tamsulosin (FLOMAX) 0.4 MG CAPS capsule Take 0.4 mg by mouth daily.    Historical Provider, MD    Family History Family History  Problem Relation Age of Onset  . CAD Father   . Heart attack Father   . Atrial fibrillation Neg Hx   . Anesthesia problems Neg Hx     Social History Social History  Substance Use Topics  . Smoking status: Never Smoker  . Smokeless tobacco: Never Used  . Alcohol use No     Comment: rare     Allergies   Patient has no known allergies.   Review of Systems Review of Systems    Constitutional: Negative for fever.  Respiratory: Negative for shortness of breath.   Cardiovascular: Positive for palpitations. Negative for chest pain.  All other systems reviewed and are negative.    Physical Exam Updated Vital Signs BP 139/84 (BP Location: Right Arm)   Pulse (!) 120 Comment: 115-145  Temp 98.9 F (37.2 C) (Oral)   Resp 18   Ht 5\' 11"  (1.803 m)   Wt 235 lb (106.6 kg)   SpO2 95%   BMI 32.78 kg/m   Physical Exam  Constitutional: He is oriented to person, place, and time. He appears well-developed and well-nourished. No distress.  HENT:  Head: Normocephalic and atraumatic.  Mouth/Throat: Oropharynx is clear and moist.  Neck: Normal range of motion. Neck supple.  Cardiovascular: Exam reveals no friction rub.   No murmur heard. Heart is irregularly irregular and rapid.  Pulmonary/Chest: Effort normal  and breath sounds normal. No respiratory distress. He has no wheezes. He has no rales.  Abdominal: Soft. Bowel sounds are normal. He exhibits no distension. There is no tenderness.  Musculoskeletal: Normal range of motion. He exhibits no edema.  Neurological: He is alert and oriented to person, place, and time. Coordination normal.  Skin: Skin is warm and dry. He is not diaphoretic.  Nursing note and vitals reviewed.    ED Treatments / Results  Labs (all labs ordered are listed, but only abnormal results are displayed) Labs Reviewed  BASIC METABOLIC PANEL  CBC  PROTIME-INR  TROPONIN I    EKG  EKG Interpretation None       Radiology No results found.  Procedures .Cardioversion Date/Time: 10/20/2016 3:51 PM Performed by: Veryl Speak Authorized by: Veryl Speak   Consent:    Consent obtained:  Written   Consent given by:  Patient and spouse   Risks discussed:  Induced arrhythmia and pain   Alternatives discussed:  No treatment and rate-control medication Pre-procedure details:    Cardioversion basis:  Emergent   Rhythm:  Atrial  fibrillation   Electrode placement:  Anterior-posterior Attempt one:    Cardioversion mode:  Synchronous   Waveform:  Monophasic   Shock (Joules):  200   Shock outcome:  Conversion to normal sinus rhythm Post-procedure details:    Patient status:  Awake   Patient tolerance of procedure:  Tolerated well, no immediate complications   (including critical care time)  Medications Ordered in ED Medications  diltiazem (CARDIZEM) 1 mg/mL load via infusion 10 mg (not administered)    And  diltiazem (CARDIZEM) 100 mg in dextrose 5 % 100 mL (1 mg/mL) infusion (not administered)     Initial Impression / Assessment and Plan / ED Course  I have reviewed the triage vital signs and the nursing notes.  Pertinent labs & imaging results that were available during my care of the patient were reviewed by me and considered in my medical decision making (see chart for details).  Patient presents here with atrial fibrillation with a history of the same. He is on anticoagulation Xarelto. Laboratory studies are reassuring and chest x-ray is clear. His EKG shows A. fib with no acute changes. He was started on a Cardizem drip which controlled his rate somewhat, however his A. fib persisted. I discussed this case with Dr. Jeffie Pollock from cardiology. It was his recommendation that this patient was appropriate for cardioversion. This was performed using propofol for conscious sedation and was returned to a sinus rhythm.  He will be observed until completely awake, then discharged if he remains in sinus rhythm. Care signed out to Dr. Laverta Baltimore at shift change.  CRITICAL CARE Performed by: Veryl Speak Total critical care time: 70 minutes Critical care time was exclusive of separately billable procedures and treating other patients. Critical care was necessary to treat or prevent imminent or life-threatening deterioration. Critical care was time spent personally by me on the following activities: development of  treatment plan with patient and/or surrogate as well as nursing, discussions with consultants, evaluation of patient's response to treatment, examination of patient, obtaining history from patient or surrogate, ordering and performing treatments and interventions, ordering and review of laboratory studies, ordering and review of radiographic studies, pulse oximetry and re-evaluation of patient's condition.   Final Clinical Impressions(s) / ED Diagnoses   Final diagnoses:  None    New Prescriptions New Prescriptions   No medications on file     Veryl Speak, MD  10/20/16 1554  

## 2016-10-20 NOTE — ED Notes (Signed)
ED Provider at bedside. 

## 2016-10-20 NOTE — ED Notes (Signed)
Pt verbalized understanding of discharge instructions and denies any further questions at this time.   

## 2016-10-20 NOTE — Sedation Documentation (Signed)
Pt cardioverted at 200J

## 2016-10-24 ENCOUNTER — Other Ambulatory Visit (HOSPITAL_COMMUNITY): Payer: Self-pay | Admitting: *Deleted

## 2016-10-24 DIAGNOSIS — I480 Paroxysmal atrial fibrillation: Secondary | ICD-10-CM

## 2016-10-24 MED ORDER — RIVAROXABAN 20 MG PO TABS: 20.0000 mg | ORAL_TABLET | Freq: Every day | ORAL | 6 refills | Status: DC

## 2016-11-05 ENCOUNTER — Inpatient Hospital Stay (HOSPITAL_COMMUNITY): Admission: RE | Admit: 2016-11-05 | Payer: Medicare Other | Source: Ambulatory Visit | Admitting: Nurse Practitioner

## 2016-11-12 ENCOUNTER — Ambulatory Visit (HOSPITAL_COMMUNITY)
Admission: RE | Admit: 2016-11-12 | Discharge: 2016-11-12 | Disposition: A | Discharge status: Home / Self Care | Payer: Medicare Other | Source: Ambulatory Visit | Attending: Nurse Practitioner | Admitting: Nurse Practitioner

## 2016-11-12 ENCOUNTER — Encounter (HOSPITAL_COMMUNITY): Payer: Self-pay | Admitting: Nurse Practitioner

## 2016-11-12 VITALS — BP 136/76 | HR 56 | Ht 71.0 in | Wt 236.4 lb

## 2016-11-12 DIAGNOSIS — Z9889 Other specified postprocedural states: Secondary | ICD-10-CM | POA: Insufficient documentation

## 2016-11-12 DIAGNOSIS — G4733 Obstructive sleep apnea (adult) (pediatric): Secondary | ICD-10-CM | POA: Diagnosis not present

## 2016-11-12 DIAGNOSIS — Z8249 Family history of ischemic heart disease and other diseases of the circulatory system: Secondary | ICD-10-CM | POA: Insufficient documentation

## 2016-11-12 DIAGNOSIS — I480 Paroxysmal atrial fibrillation: Secondary | ICD-10-CM | POA: Diagnosis not present

## 2016-11-12 DIAGNOSIS — I10 Essential (primary) hypertension: Secondary | ICD-10-CM | POA: Diagnosis not present

## 2016-11-12 DIAGNOSIS — K219 Gastro-esophageal reflux disease without esophagitis: Secondary | ICD-10-CM | POA: Insufficient documentation

## 2016-11-12 DIAGNOSIS — Z7901 Long term (current) use of anticoagulants: Secondary | ICD-10-CM | POA: Insufficient documentation

## 2016-11-12 DIAGNOSIS — E669 Obesity, unspecified: Secondary | ICD-10-CM | POA: Insufficient documentation

## 2016-11-12 DIAGNOSIS — Z6839 Body mass index (BMI) 39.0-39.9, adult: Secondary | ICD-10-CM | POA: Insufficient documentation

## 2016-11-12 DIAGNOSIS — Z79899 Other long term (current) drug therapy: Secondary | ICD-10-CM | POA: Diagnosis not present

## 2016-11-12 NOTE — Progress Notes (Signed)
Patient ID: Carlos Thomas, male   DOB: September 05, 1950, 66 y.o.   MRN: [id number]     Primary Care Physician: Shirline Frees, MD Referring Physician: Dr. Loney Hering Carlos Thomas is a 66 y.o. male with a h/o paroxsymal afib for f/u in the afib clinic. He reports that he had a  successful cardioversion in March of this year. He has not had any more significant afib. Taking flecainide consistently as well as xarelto, no bleeding issues. Uses CPAP for OSA consistently.  F/u in afib clinic, 5/15. He states that he had a successful cardioversion 4/22 in the setting of URI and decongestants. No further afib since that time. Being compliant with anticoagulant. On xarelto at appropriate dose of 20 mg a day with a crcl cal of 103.37.  Today, he denies symptoms of palpitations, chest pain, shortness of breath, orthopnea, PND, lower extremity edema, dizziness, presyncope, syncope, or neurologic sequela. The patient is tolerating medications without difficulties and is otherwise without complaint today.   Past Medical History:  Diagnosis Date  . Anticoagulant long-term use    xarelto  . Balanitis   . GERD (gastroesophageal reflux disease)   . History of adenomatous polyp of colon    2006  tubular adenoma's and hyperplastic polyp's;   2011  tubular adenoma  . History of atrial flutter    s/p ablation atrial flutter/atrial fib in 2010  . History of MRSA infection    1990's  infected wound near surgerical area   . History of repair of hiatal hernia   . HTN (hypertension)   . Obesity (BMI 30-39.9)   . OSA on CPAP    Mild OSA  per study 05-07-2012-- w AHI 6.57/hr now on 13cm H2O  . PAF (paroxysmal atrial fibrillation) [facility]) primary cardiologist-  dr Marlou Porch   ep cardiologist-  dr Rayann Heman---  s/p  ablation afib and aflutter 2010  and  ablation afib/svt  2013  . Peripheral neuropathy, hereditary/idiopathic    bilateral feet---  neurologist- dr patel  . 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 S/P radiofrequency ablation operation for arrhythmia    01-09-2009  cardiac ablation afib/flutter;   09-13-2011  ablation afib/svt and in ther right atrium  . Wears glasses    Past Surgical History:  Procedure Laterality Date  . ATRIAL FIBRILLATION ABLATION N/A 09/13/2011   Procedure: ATRIAL FIBRILLATION ABLATION;  Surgeon: Thompson Grayer, MD;  Location[facility] CATH LAB;  Service: Cardiovascular;  Laterality: N/A;  . CARDIAC ELECTROPHYSIOLOGY MAPPING AND ABLATION  [phone]   dr allred   successful ablation atrial fibrillation /  atrial flutter  . CARDIAC ELECTROPHYSIOLOGY MAPPING AND ABLATION  09-13-2011   dr Rayann Heman   afib mapping/ ablation and  mapping/ablation in the right atrium  . CARDIOVASCULAR STRESS TEST  09/2006   per dr Marlou Porch note nuclear study normal w/ no ischemia  . CARDIOVERSION N/A 09/21/2015   Procedure: CARDIOVERSION;  Surgeon: Lelon Perla, MD;  Location: [facility] ENDOSCOPY;  Service: Cardiovascular;  Laterality: N/A;  . CIRCUMCISION N/A 06/27/2016   Procedure: CIRCUMCISION ADULT;  Surgeon: Carolan Clines, MD;  Location: [facility];  Service: Urology;  Laterality: N/A;  . COLONOSCOPY  last one 02/  2011  . DIRECT CURRENT CARDIOVERSION  02/02/2010   dr Leonia Reeves  . LAPAROSCOPIC CHOLECYSTECTOMY  1990's  . OPEN HIATAL HERNIA REPAIR  1970's  . TEE WITHOUT CARDIOVERSION  09/12/2011   Procedure: TRANSESOPHAGEAL ECHOCARDIOGRAM (TEE);  Surgeon: Charlann Lange.  Irish Lack, MD;  Location: [facility];  Service: Cardiovascular;  Laterality: N/A;  trace AR, no LA/LAA thrombus, normal LVfunction, moderate descending aortic atherosclerosis  . TEE WITHOUT CARDIOVERSION N/A 09/21/2015   Procedure: TRANSESOPHAGEAL ECHOCARDIOGRAM (TEE);  Surgeon: Lelon Perla, MD;  Location: [facility] ENDOSCOPY;  Service: Cardiovascular;  Laterality: N/A; normal LV systolic funciton,  moderate LAE,  no LAA thrombus,  trace MR and TR  . TRANSTHORACIC ECHOCARDIOGRAM  06/03/2014   ef  55-60%/  trivial MR and TR    Current Outpatient Prescriptions  Medication Sig Dispense Refill  . CARTIA XT 240 MG 24 hr capsule TAKE 1 CAPSULE(240 MG) BY MOUTH DAILY (Patient taking differently: TAKE 1 CAPSULE(240 MG) BY MOUTH DAILY--- takes in am) 30 capsule 11  . flecainide (TAMBOCOR) 100 MG tablet Take 1 tablet (100 mg total) by mouth 2 (two) times daily. 180 tablet 3  . omeprazole (PRILOSEC) 20 MG capsule Take 20 mg by mouth every morning.     . rivaroxaban (XARELTO) 20 MG TABS tablet Take 1 tablet (20 mg total) by mouth daily with supper. 30 tablet 6  . tamsulosin (FLOMAX) 0.4 MG CAPS capsule Take 0.4 mg by mouth daily.    . Amino Acids (AMINO ACID PO) Take by mouth daily.     No current facility-administered medications for this encounter.     No Known Allergies  Social History   Social History  . Marital status: Divorced    Spouse name: N/A  . Number of children: N/A  . Years of education: N/A   Occupational History  . Not on file.   Social History Main Topics  . Smoking status: Never Smoker  . Smokeless tobacco: Never Used  . Alcohol use No     Comment: rare  . Drug use: No  . Sexual activity: Not on file   Other Topics Concern  . 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  Education: college.    Family History  Problem Relation Age of Onset  . CAD Father   . Heart attack Father   . Atrial fibrillation Neg Hx   . Anesthesia problems Neg Hx     ROS- All systems are reviewed and negative except as per the HPI above  Physical Exam: Vitals:   11/12/16 1028  BP: 136/76  Pulse: (!) 56  Weight: 236 lb 6.4 oz (107.2 kg)  Height: 5\' 11"  (1.803 m)    GEN- The patient is well appearing, alert and oriented x 3 today.   Head- normocephalic, atraumatic Eyes-  Sclera clear, conjunctiva pink Ears- hearing intact Oropharynx- clear Neck- supple, no  JVP Lymph- no cervical lymphadenopathy Lungs- Clear to ausculation bilaterally, normal work of breathing Heart- Regular rate and rhythm, no murmurs, rubs or gallops, PMI not laterally displaced GI- soft, NT, ND, + BS Extremities- no clubbing, cyanosis, or edema MS- no significant deformity or atrophy Skin- no rash or lesion Psych- euthymic mood, full affect Neuro- strength and sensation are intact  EKG-Sinus brady, at 56 bpm, pr int 184 ms, qrs int 98 ms, qtc 405 ms Epic records reviewed.  Assessment and Plan: 1. PAF Doing well with flecainide 100 mg bid Continue diltiazem Continue xarelto   F/u in afib clinic 6 months F/u with Dr. Marlou Porch  3-4 months  Geroge Baseman. Mattheu Brodersen, [facility] [place], [place]  [id number] [phone]  

## 2016-12-02 ENCOUNTER — Ambulatory Visit (INDEPENDENT_AMBULATORY_CARE_PROVIDER_SITE_OTHER): Payer: Medicare Other | Admitting: Neurology

## 2016-12-02 ENCOUNTER — Ambulatory Visit: Payer: Medicare Other | Admitting: Cardiology

## 2016-12-02 ENCOUNTER — Encounter: Payer: Self-pay | Admitting: Neurology

## 2016-12-02 VITALS — BP 128/70 | HR 54 | Ht 71.0 in | Wt 238.6 lb

## 2016-12-02 DIAGNOSIS — G609 Hereditary and idiopathic neuropathy, unspecified: Secondary | ICD-10-CM

## 2016-12-02 NOTE — Progress Notes (Signed)
Follow-up Visit   Date: 12/02/16    [name] MRN: [id number] DOB: July 31, 1950   Interim History: Carlos Thomas is a 66 y.o. Caucasian male with GERD, OSA on CPAP, and atrial fibrillation returning to the clinic for follow-up of neuropathy.  The patient was accompanied to the clinic by self.  History of present illness: Starting in 2016, he began noticing numbness of the great toe, which gradually progressed to the based of the toes and a few months later developed the same symptoms over the right toe.  Symptoms are constant and not painful.  There is no tingling or burning.  He denies any imbalance, and walks independently without any falls.  He started taking L-arginine in the summer of 2017 at the recommendation of his chiropractor. Electrodiagnostic testing in September 2017 showed axonal sensorimotor polyneuropathy affecting the legs.    He has no personal history of diabetes.  He stopped drinking alcohol in 2010, when he was drinking on the 2-3 liquor drinks on the weekends.  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  No family history of neuropathy.    UPDATE 12/02/2016:  He is here for 6 month follow-up visit.  He feels that numbness is worse and affects more of the sole of the feet.  He denies any painful tingling or burning.  He has some imbalance, but walks unassisted and has not had any falls.  He is frustrated that his neuropathy cannot be "fixed".   Medications:  Current Outpatient Prescriptions on File Prior to Visit  Medication Sig Dispense Refill  . Amino Acids (AMINO ACID PO) Take by mouth daily.    Marland Kitchen CARTIA XT 240 MG 24 hr capsule TAKE 1 CAPSULE(240 MG) BY MOUTH DAILY (Patient taking differently: TAKE 1 CAPSULE(240 MG) BY MOUTH DAILY--- takes in am) 30 capsule 11  . flecainide (TAMBOCOR) 100 MG tablet Take 1 tablet (100 mg total) by mouth 2 (two) times daily. 180 tablet 3  . omeprazole (PRILOSEC) 20 MG  capsule Take 20 mg by mouth every morning.     . rivaroxaban (XARELTO) 20 MG TABS tablet Take 1 tablet (20 mg total) by mouth daily with supper. 30 tablet 6  . tamsulosin (FLOMAX) 0.4 MG CAPS capsule Take 0.4 mg by mouth daily.     No current facility-administered medications on file prior to visit.     Allergies: No Known Allergies  Review of Systems:  CONSTITUTIONAL: No fevers, chills, night sweats, or weight loss.  EYES: No visual changes or eye pain ENT: No hearing changes.  No history of nose bleeds.   RESPIRATORY: No cough, wheezing and shortness of breath.   CARDIOVASCULAR: Negative for chest pain, and palpitations.   GI: Negative for abdominal discomfort, blood in stools or black stools.  No recent change in bowel habits.   GU:  No history of incontinence.   MUSCLOSKELETAL: No history of joint pain or swelling.  No myalgias.   SKIN: Negative for lesions, rash, and itching.   ENDOCRINE: Negative for cold or heat intolerance, polydipsia or goiter.   PSYCH:  No depression or anxiety symptoms.   NEURO: As Above.   Vital Signs:  BP 128/70   Pulse (!) 54   Ht _0  (1.803 m)   Wt 238 lb 9 oz (108.2 kg)   SpO2 96%   BMI 33.27 kg/m    Neurological Exam: MENTAL STATUS including orientation to time, place, person,  recent and remote memory, attention span and concentration, language, and fund of knowledge is normal.  Speech is not dysarthric.  CRANIAL NERVES:  Pupils equal round and reactive to light.  Normal conjugate, extra-ocular eye movements in all directions of gaze.  No ptosis.  Face is symmetric.   MOTOR:  Motor strength is 5/5 in all extremities, include toe flexion and extension.    MSRs:  Reflexes are 2+/4 throughout, except absent Achilles bilaterally.  SENSORY: Vibration is reduced only at the great toe, temperature and pin prick is intact. There is mild sway with Rhomberg testing.  COORDINATION/GAIT:   Gait narrow based and stable.  He is unsteady with stressed  gait.   Data: NCS/EMG of the legs 03/21/2016:  The electrophysiologic findings are most consistent with a distal and symmetric sensorimotor polyneuropathy, axon loss in type, affecting the lower extremities. Overall, these findings are mild to moderate in degree electrically.  Labs 04/04/2016:  HbA1c 5.8, ESR 4, TSH 1.26   Labs 06/03/2016:  Vitamin B1 15, heavy metal screen neg, ceruloplasmin 21, SPEP with IFE no M protein  IMPRESSION/PLAN: Predominately sensory peripheral neuropathy, idiopathic and to a much lesser degree contributed by his history of alcohol use. EDX showed mild-moderate sensorimotor axonal neuropathy. Clinically, his exam is stable with neuropathy restricted to the distal feet.   His serology testing has been nondiagnostic (see above) and he is frustrated at the lack of diagnostic clarity and the fact that neuropathy cannot be treated. His symptoms are predominately numbness and I explained that there is no role for medications with negative dysesthesias.  He was offered a second opinion with a local neurologist or at an academic center, as I do not feel that he is satisfied with my explanation of his condition and management options.  He may consider this if his neuropathy progresses.   Return to clinic in 1 year.   The duration of this appointment visit was 20 minutes of face-to-face time with the patient.  Greater than 50% of this time was spent in counseling, explanation of diagnosis, planning of further management, and coordination of care.   Thank you for allowing me to participate in patient's care.  If I can answer any additional questions, I would be pleased to do so.    Sincerely,    Donika K. Posey Pronto, DO

## 2016-12-02 NOTE — Patient Instructions (Signed)
Return to clinic in 1 year.

## 2017-01-10 ENCOUNTER — Telehealth: Payer: Self-pay | Admitting: *Deleted

## 2017-01-10 NOTE — Telephone Encounter (Signed)
Spoke to patient about changing his Friday 01/17/17 at 8am appointment. Patient states he will be out of town on 7/23,7/24,7/25 and another doctors appointment on 7/27.  Patient has an appointment for February 25 2017 at 11:40am Patient notified on cell#[phone].

## 2017-01-17 ENCOUNTER — Ambulatory Visit: Payer: Medicare Other | Admitting: Cardiology

## 2017-02-25 ENCOUNTER — Ambulatory Visit (INDEPENDENT_AMBULATORY_CARE_PROVIDER_SITE_OTHER): Payer: Medicare Other | Admitting: Cardiology

## 2017-02-25 ENCOUNTER — Encounter: Payer: Self-pay | Admitting: Cardiology

## 2017-02-25 VITALS — BP 134/72 | HR 55 | Ht 71.0 in | Wt 238.2 lb

## 2017-02-25 DIAGNOSIS — I10 Essential (primary) hypertension: Secondary | ICD-10-CM

## 2017-02-25 DIAGNOSIS — E669 Obesity, unspecified: Secondary | ICD-10-CM

## 2017-02-25 DIAGNOSIS — G4733 Obstructive sleep apnea (adult) (pediatric): Secondary | ICD-10-CM | POA: Diagnosis not present

## 2017-02-25 NOTE — Patient Instructions (Signed)

## 2017-02-25 NOTE — Progress Notes (Signed)
Cardiology Office Note:    Date:  02/25/2017   ID:  Carlos Thomas, DOB 06-08-1951, MRN [id number]  PCP:  Shirline Frees, MD  Cardiologist:  Fransico Him, MD   Referring MD: Shirline Frees, MD   Chief Complaint  Patient presents with  . Sleep Apnea  . Hypertension    History of Present Illness:    Carlos Thomas is a 66 y.o. male with a hx of OSA and obesity who presents today for followup. He is doing well with his CPAP device. He tolerates his CPAP without any problems. He tolerates the full face mask and feels the pressure is adequate. He denies any nasal congestion or dryness and no mouth dryness . He feels rested in the am and has no significant daytime sleepiness. He does not think he snores at night. He does not get much aerobic exercise. He has gained 6lb since December 2017.  Past Medical History:  Diagnosis Date  . Anticoagulant long-term use    xarelto  . Balanitis   . GERD (gastroesophageal reflux disease)   . History of adenomatous polyp of colon    2006  tubular adenoma's and hyperplastic polyp's;   2011  tubular adenoma  . History of atrial flutter    s/p ablation atrial flutter/atrial fib in 2010  . History of MRSA infection    1990's  infected wound near surgerical area   . History of repair of hiatal hernia   . HTN (hypertension)   . Obesity (BMI 30-39.9)   . OSA on CPAP    Mild OSA  per study 05-07-2012-- w AHI 6.57/hr now on 13cm H2O  . PAF (paroxysmal atrial fibrillation) [facility]) primary cardiologist-  dr Marlou Porch   ep cardiologist-  dr Rayann Heman---  s/p  ablation afib and aflutter 2010  and  ablation afib/svt  2013  . Peripheral neuropathy, hereditary/idiopathic    bilateral feet---  neurologist- dr patel  . 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 S/P radiofrequency ablation operation for arrhythmia    01-09-2009  cardiac ablation afib/flutter;   09-13-2011  ablation afib/svt and in ther right atrium  . Wears glasses      Past Surgical History:  Procedure Laterality Date  . ATRIAL FIBRILLATION ABLATION N/A 09/13/2011   Procedure: ATRIAL FIBRILLATION ABLATION;  Surgeon: Thompson Grayer, MD;  Location: [facility] CATH LAB;  Service: Cardiovascular;  Laterality: N/A;  . CARDIAC ELECTROPHYSIOLOGY MAPPING AND ABLATION  [phone]   dr allred   successful ablation atrial fibrillation /  atrial flutter  . CARDIAC ELECTROPHYSIOLOGY MAPPING AND ABLATION  09-13-2011   dr Rayann Heman   afib mapping/ ablation and  mapping/ablation in the right atrium  . CARDIOVASCULAR STRESS TEST  09/2006   per dr Marlou Porch note nuclear study normal w/ no ischemia  . CARDIOVERSION N/A 09/21/2015   Procedure: CARDIOVERSION;  Surgeon: Lelon Perla, MD;  Location: [facility] ENDOSCOPY;  Service: Cardiovascular;  Laterality: N/A;  . CIRCUMCISION N/A 06/27/2016   Procedure: CIRCUMCISION ADULT;  Surgeon: Carolan Clines, MD;  Location: [facility];  Service: Urology;  Laterality: N/A;  . COLONOSCOPY  last one 02/  2011  . DIRECT CURRENT CARDIOVERSION  02/02/2010   dr Leonia Reeves  . LAPAROSCOPIC CHOLECYSTECTOMY  1990's  . OPEN HIATAL HERNIA REPAIR  1970's  . TEE WITHOUT CARDIOVERSION  09/12/2011   Procedure: TRANSESOPHAGEAL ECHOCARDIOGRAM (TEE);  Surgeon: Jettie Booze, MD;  Location: [place];  Service: Cardiovascular;  Laterality: N/A;  trace AR,  no LA/LAA thrombus, normal LVfunction, moderate descending aortic atherosclerosis  . TEE WITHOUT CARDIOVERSION N/A 09/21/2015   Procedure: TRANSESOPHAGEAL ECHOCARDIOGRAM (TEE);  Surgeon: Lelon Perla, MD;  Location: [facility] ENDOSCOPY;  Service: Cardiovascular;  Laterality: N/A; normal LV systolic funciton,  moderate LAE,  no LAA thrombus,  trace MR and TR  . TRANSTHORACIC ECHOCARDIOGRAM  06/03/2014   ef 55-60%/  trivial MR and TR    Current Medications: Current Meds  Medication Sig  . CARTIA XT 240 MG 24 hr capsule TAKE 1 CAPSULE(240 MG) BY MOUTH DAILY (Patient taking differently: TAKE 1  CAPSULE(240 MG) BY MOUTH DAILY--- takes in am)  . flecainide (TAMBOCOR) 100 MG tablet Take 1 tablet (100 mg total) by mouth 2 (two) times daily.  Marland Kitchen omeprazole (PRILOSEC) 20 MG capsule Take 20 mg by mouth every morning.   . rivaroxaban (XARELTO) 20 MG TABS tablet Take 1 tablet (20 mg total) by mouth daily with supper.  . tamsulosin (FLOMAX) 0.4 MG CAPS capsule Take 0.4 mg by mouth daily.     Allergies:   Patient has no known allergies.   Social History   Social History  . Marital status: Divorced    Spouse name: N/A  . Number of children: N/A  . Years of education: N/A   Social History Main Topics  . Smoking status: Never Smoker  . Smokeless tobacco: Never Used  . Alcohol use No     Comment: rare  . Drug use: No  . Sexual activity: Not Asked   Other Topics Concern  . 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  Education: college.     Family History: The patient's family history includes CAD in his father; Heart attack in his father. There is no history of Atrial fibrillation or Anesthesia problems.  ROS:   Please see the history of present illness.     All other systems reviewed and are negative.  EKGs/Labs/Other Studies Reviewed:    The following studies were reviewed today: CPAP download  EKG:  EKG is not ordered today.   Recent Labs: 10/20/2016: BUN 10; Creatinine, Ser 1.05; Hemoglobin 15.7; Platelets 158; Potassium 4.0; Sodium 135   Recent Lipid Panel No results found for: CHOL, TRIG, HDL, CHOLHDL, VLDL, LDLCALC, LDLDIRECT  Physical Exam:    VS:  BP 134/72   Pulse (!) 55   Ht 5\' 11"  (1.803 m)   Wt 238 lb 3.2 oz (108 kg)   SpO2 95%   BMI 33.22 kg/m     Wt Readings from Last 3 Encounters:  02/25/17 238 lb 3.2 oz (108 kg)  12/02/16 238 lb 9 oz (108.2 kg)  11/12/16 236 lb 6.4 oz (107.2 kg)     GEN:  Well nourished, well developed in no acute  distress HEENT: Normal NECK: No JVD; No carotid bruits LYMPHATICS: No lymphadenopathy CARDIAC: RRR, no murmurs, rubs, gallops RESPIRATORY:  Clear to auscultation without rales, wheezing or rhonchi  ABDOMEN: Soft, non-tender, non-distended MUSCULOSKELETAL:  No edema; No deformity  SKIN: Warm and dry NEUROLOGIC:  Alert and oriented x 3 PSYCHIATRIC:  Normal affect   ASSESSMENT:    1. OSA (obstructive sleep apnea)   2. Essential hypertension   3. Obesity (BMI 30-39.9)    PLAN:    In order of problems listed above:  OSA - the patient is tolerating PAP therapy well without any problems. The patient has  been using and benefiting from CPAP use and will continue to benefit from therapy. I will get a download from his DME.   HTN - his BP is well controlled on exam today.  He will continue on Cartia XT. Obesity -  I have encouraged him to get into a routine exercise program and cut back on carbs and portions.     Medication Adjustments/Labs and Tests Ordered: Current medicines are reviewed at length with the patient today.  Concerns regarding medicines are outlined above.  No orders of the defined types were placed in this encounter.  No orders of the defined types were placed in this encounter.   Signed, Fransico Him, MD  02/25/2017 12:22 PM    [facility]

## 2017-02-28 ENCOUNTER — Telehealth: Payer: Self-pay | Admitting: *Deleted

## 2017-02-28 NOTE — Telephone Encounter (Signed)
[facility] ([facility]) will send patient's 365 day download and a 30 day download to our office.

## 2017-02-28 NOTE — Telephone Encounter (Signed)
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 We will also need a download too.  Thank you!

## 2017-03-04 ENCOUNTER — Encounter: Payer: Self-pay | Admitting: Cardiology

## 2017-03-07 NOTE — Telephone Encounter (Signed)
Per [facility] patient has received his download for an entire year to give to his flight physician.

## 2017-03-14 ENCOUNTER — Telehealth: Payer: Self-pay | Admitting: *Deleted

## 2017-03-14 NOTE — Telephone Encounter (Signed)
-----   Message from Sueanne Margarita, MD sent at 03/11/2017  1:33 PM EDT ----- Good AHI and compliance.  Continue current CPAP settings.

## 2017-03-14 NOTE — Telephone Encounter (Signed)
Informed patient of compliance results and verbalized understanding was indicated. Patient understands his events are in normal range. Patient understands his compliance is good. Patient understands his settings will not change. Patient was grateful for the call and thanked me.

## 2017-05-21 ENCOUNTER — Other Ambulatory Visit: Payer: Self-pay | Admitting: Internal Medicine

## 2017-05-23 ENCOUNTER — Other Ambulatory Visit (HOSPITAL_COMMUNITY): Payer: Self-pay | Admitting: Nurse Practitioner

## 2017-05-23 DIAGNOSIS — I480 Paroxysmal atrial fibrillation: Secondary | ICD-10-CM

## 2017-06-17 ENCOUNTER — Other Ambulatory Visit (HOSPITAL_COMMUNITY): Payer: Self-pay | Admitting: *Deleted

## 2017-06-17 DIAGNOSIS — I480 Paroxysmal atrial fibrillation: Secondary | ICD-10-CM

## 2017-06-17 MED ORDER — RIVAROXABAN 20 MG PO TABS: ORAL_TABLET | ORAL | 3 refills | Status: DC

## 2017-06-17 MED ORDER — DILTIAZEM HCL ER COATED BEADS 240 MG PO CP24: ORAL_CAPSULE | ORAL | 6 refills | Status: DC

## 2017-06-17 MED ORDER — FLECAINIDE ACETATE 100 MG PO TABS: ORAL_TABLET | ORAL | 3 refills | Status: DC

## 2017-07-03 ENCOUNTER — Encounter (HOSPITAL_COMMUNITY): Payer: Self-pay | Admitting: Nurse Practitioner

## 2017-07-03 ENCOUNTER — Ambulatory Visit (HOSPITAL_COMMUNITY)
Admission: RE | Admit: 2017-07-03 | Discharge: 2017-07-03 | Disposition: A | Discharge status: Home / Self Care | Payer: Medicare Other | Source: Ambulatory Visit | Attending: Nurse Practitioner | Admitting: Nurse Practitioner

## 2017-07-03 VITALS — BP 134/72 | HR 53 | Ht 71.0 in | Wt 238.0 lb

## 2017-07-03 DIAGNOSIS — Z8249 Family history of ischemic heart disease and other diseases of the circulatory system: Secondary | ICD-10-CM | POA: Insufficient documentation

## 2017-07-03 DIAGNOSIS — E669 Obesity, unspecified: Secondary | ICD-10-CM | POA: Insufficient documentation

## 2017-07-03 DIAGNOSIS — I480 Paroxysmal atrial fibrillation: Secondary | ICD-10-CM | POA: Diagnosis not present

## 2017-07-03 DIAGNOSIS — I10 Essential (primary) hypertension: Secondary | ICD-10-CM | POA: Insufficient documentation

## 2017-07-03 DIAGNOSIS — Z8614 Personal history of Methicillin resistant Staphylococcus aureus infection: Secondary | ICD-10-CM | POA: Insufficient documentation

## 2017-07-03 DIAGNOSIS — K219 Gastro-esophageal reflux disease without esophagitis: Secondary | ICD-10-CM | POA: Diagnosis not present

## 2017-07-03 DIAGNOSIS — Z6833 Body mass index (BMI) 33.0-33.9, adult: Secondary | ICD-10-CM | POA: Diagnosis not present

## 2017-07-03 DIAGNOSIS — Z79899 Other long term (current) drug therapy: Secondary | ICD-10-CM | POA: Diagnosis not present

## 2017-07-03 DIAGNOSIS — G4733 Obstructive sleep apnea (adult) (pediatric): Secondary | ICD-10-CM | POA: Diagnosis not present

## 2017-07-03 DIAGNOSIS — I4891 Unspecified atrial fibrillation: Secondary | ICD-10-CM | POA: Diagnosis present

## 2017-07-03 DIAGNOSIS — G609 Hereditary and idiopathic neuropathy, unspecified: Secondary | ICD-10-CM | POA: Insufficient documentation

## 2017-07-03 DIAGNOSIS — Z86010 Personal history of colonic polyps: Secondary | ICD-10-CM | POA: Insufficient documentation

## 2017-07-03 DIAGNOSIS — Z9049 Acquired absence of other specified parts of digestive tract: Secondary | ICD-10-CM | POA: Insufficient documentation

## 2017-07-03 DIAGNOSIS — Z7901 Long term (current) use of anticoagulants: Secondary | ICD-10-CM | POA: Insufficient documentation

## 2017-07-03 NOTE — Progress Notes (Signed)
Patient ID: Carlos Thomas, male   DOB: 18-Dec-1950, 67 y.o.   MRN: [id number]     Primary Care Physician: Shirline Frees, MD Referring Physician: Dr. Loney Hering Carlos Thomas is a 67 y.o. male with a h/o paroxsymal afib for f/u in the afib clinic, 07/03/17, s/p ablation x 2, 2010 and 2013.Marland Kitchen He   had a  successful cardioversion in April 2018 in the setting of URI.Marland Kitchen He has not had any more significant afib. Taking flecainide consistently as well as xarelto, no bleeding issues. Uses CPAP for OSA consistently.    Today, he denies symptoms of palpitations, chest pain, shortness of breath, orthopnea, PND, lower extremity edema, dizziness, presyncope, syncope, or neurologic sequela. The patient is tolerating medications without difficulties and is otherwise without complaint today.   Past Medical History:  Diagnosis Date  . Anticoagulant long-term use    xarelto  . Balanitis   . GERD (gastroesophageal reflux disease)   . History of adenomatous polyp of colon    2006  tubular adenoma's and hyperplastic polyp's;   2011  tubular adenoma  . History of atrial flutter    s/p ablation atrial flutter/atrial fib in 2010  . History of MRSA infection    1990's  infected wound near surgerical area   . History of repair of hiatal hernia   . HTN (hypertension)   . Obesity (BMI 30-39.9)   . OSA on CPAP    Mild OSA  per study 05-07-2012-- w AHI 6.57/hr now on 13cm H2O  . PAF (paroxysmal atrial fibrillation) [facility]) primary cardiologist-  dr Marlou Porch   ep cardiologist-  dr Rayann Heman---  s/p  ablation afib and aflutter 2010  and  ablation afib/svt  2013  . Peripheral neuropathy, hereditary/idiopathic    bilateral feet---  neurologist- dr patel  . 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 S/P radiofrequency ablation operation for arrhythmia    01-09-2009  cardiac ablation afib/flutter;   09-13-2011  ablation afib/svt and in ther right atrium  . Wears glasses    Past Surgical  History:  Procedure Laterality Date  . ATRIAL FIBRILLATION ABLATION N/A 09/13/2011   Procedure: ATRIAL FIBRILLATION ABLATION;  Surgeon: Thompson Grayer, MD;  Location: [facility] CATH LAB;  Service: Cardiovascular;  Laterality: N/A;  . CARDIAC ELECTROPHYSIOLOGY MAPPING AND ABLATION  [phone]   dr allred   successful ablation atrial fibrillation /  atrial flutter  . CARDIAC ELECTROPHYSIOLOGY MAPPING AND ABLATION  09-13-2011   dr Rayann Heman   afib mapping/ ablation and  mapping/ablation in the right atrium  . CARDIOVASCULAR STRESS TEST  09/2006   per dr Marlou Porch note nuclear study normal w/ no ischemia  . CARDIOVERSION N/A 09/21/2015   Procedure: CARDIOVERSION;  Surgeon: Lelon Perla, MD;  Location: [facility] ENDOSCOPY;  Service: Cardiovascular;  Laterality: N/A;  . CIRCUMCISION N/A 06/27/2016   Procedure: CIRCUMCISION ADULT;  Surgeon: Carolan Clines, MD;  Location: [facility];  Service: Urology;  Laterality: N/A;  . COLONOSCOPY  last one 02/  2011  . DIRECT CURRENT CARDIOVERSION  02/02/2010   dr Leonia Reeves  . LAPAROSCOPIC CHOLECYSTECTOMY  1990's  . OPEN HIATAL HERNIA REPAIR  1970's  . TEE WITHOUT CARDIOVERSION  09/12/2011   Procedure: TRANSESOPHAGEAL ECHOCARDIOGRAM (TEE);  Surgeon: Jettie Booze, MD;  Location: [facility] ENDOSCOPY;  Service: Cardiovascular;  Laterality: N/A;  trace AR, no LA/LAA thrombus, normal LVfunction, moderate descending aortic atherosclerosis  . TEE WITHOUT CARDIOVERSION N/A 09/21/2015   Procedure: TRANSESOPHAGEAL ECHOCARDIOGRAM (  TEE);  Surgeon: Lelon Perla, MD;  Location: [facility] ENDOSCOPY;  Service: Cardiovascular;  Laterality: N/A; normal LV systolic funciton,  moderate LAE,  no LAA thrombus,  trace MR and TR  . TRANSTHORACIC ECHOCARDIOGRAM  06/03/2014   ef 55-60%/  trivial MR and TR    Current Outpatient Medications  Medication Sig Dispense Refill  . diltiazem (CARTIA XT) 240 MG 24 hr capsule TAKE 1 CAPSULE(240 MG) BY MOUTH DAILY 30 capsule 6  . flecainide  (TAMBOCOR) 100 MG tablet TAKE 1 TABLET(100 MG) BY MOUTH TWICE DAILY 60 tablet 3  . omeprazole (PRILOSEC) 20 MG capsule Take 20 mg by mouth every morning.     . rivaroxaban (XARELTO) 20 MG TABS tablet TAKE 1 TABLET(20 MG) BY MOUTH DAILY WITH SUPPER 30 tablet 3  . tamsulosin (FLOMAX) 0.4 MG CAPS capsule Take 0.4 mg by mouth daily.     No current facility-administered medications for this encounter.     No Known Allergies  Social History   Socioeconomic History  . Marital status: Divorced    Spouse name: Not on file  . Number of children: Not on file  . Years of education: Not on file  . Highest education level: Not on file  Social Needs  . Financial resource strain: Not on file  . Food insecurity - worry: Not on file  . Food insecurity - inability: Not on file  . Transportation needs - medical: Not on file  . Transportation needs - non-medical: Not on file  Occupational History  . Not on file  Tobacco Use  . Smoking status: Never Smoker  . Smokeless tobacco: Never Used  Substance and Sexual Activity  . Alcohol use: No    Alcohol/week: 0.6 oz    Types: 1 Cans of beer per week    Comment: rare  . Drug use: No  . Sexual activity: Not on file  Other Topics Concern  . 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  Education: college.    Family History  Problem Relation Age of Onset  . CAD Father   . Heart attack Father   . Atrial fibrillation Neg Hx   . Anesthesia problems Neg Hx     ROS- All systems are reviewed and negative except as per the HPI above  Physical Exam: Vitals:   07/03/17 1107  BP: 134/72  Pulse: (!) 53  Weight: 238 lb (108 kg)  Height: 5\' 11"  (1.803 m)    GEN- The patient is well appearing, alert and oriented x 3 today.   Head- normocephalic, atraumatic Eyes-  Sclera clear, conjunctiva pink Ears- hearing intact Oropharynx- clear Neck-  supple, no JVP Lymph- no cervical lymphadenopathy Lungs- Clear to ausculation bilaterally, normal work of breathing Heart- Regular rate and rhythm, no murmurs, rubs or gallops, PMI not laterally displaced GI- soft, NT, ND, + BS Extremities- no clubbing, cyanosis, or edema MS- no significant deformity or atrophy Skin- no rash or lesion Psych- euthymic mood, full affect Neuro- strength and sensation are intact  EKG-Sinus brady, at 53 bpm, pr int 184 ms, qrs int 98 ms, qtc 416 ms Epic records reviewed.  Assessment and Plan: 1. PAF Doing well with flecainide 100 mg bid Continue diltiazem HR usually in the 50's and is not symptomatic Continue xarelto for chadsvasc score of 2 Continue cpap for OSA  F/u ith Dr. Marlou Porch in 6  months  Geroge Baseman. Kery Batzel, [facility] [address] [place], [place] [address] [phone]

## 2017-08-31 ENCOUNTER — Emergency Department (HOSPITAL_BASED_OUTPATIENT_CLINIC_OR_DEPARTMENT_OTHER)
Admission: EM | Admit: 2017-08-31 | Discharge: 2017-08-31 | Disposition: A | Discharge status: Home / Self Care | Payer: Medicare Other | Attending: Emergency Medicine | Admitting: Emergency Medicine

## 2017-08-31 ENCOUNTER — Other Ambulatory Visit: Payer: Self-pay

## 2017-08-31 ENCOUNTER — Emergency Department (HOSPITAL_BASED_OUTPATIENT_CLINIC_OR_DEPARTMENT_OTHER): Payer: Medicare Other

## 2017-08-31 ENCOUNTER — Encounter (HOSPITAL_BASED_OUTPATIENT_CLINIC_OR_DEPARTMENT_OTHER): Payer: Self-pay | Admitting: Emergency Medicine

## 2017-08-31 DIAGNOSIS — N2889 Other specified disorders of kidney and ureter: Secondary | ICD-10-CM | POA: Diagnosis not present

## 2017-08-31 DIAGNOSIS — R197 Diarrhea, unspecified: Secondary | ICD-10-CM | POA: Diagnosis present

## 2017-08-31 DIAGNOSIS — I10 Essential (primary) hypertension: Secondary | ICD-10-CM | POA: Diagnosis not present

## 2017-08-31 DIAGNOSIS — K529 Noninfective gastroenteritis and colitis, unspecified: Secondary | ICD-10-CM | POA: Diagnosis not present

## 2017-08-31 DIAGNOSIS — Z79899 Other long term (current) drug therapy: Secondary | ICD-10-CM | POA: Insufficient documentation

## 2017-08-31 DIAGNOSIS — Z7901 Long term (current) use of anticoagulants: Secondary | ICD-10-CM | POA: Insufficient documentation

## 2017-08-31 LAB — CBC WITH DIFFERENTIAL/PLATELET
Basophils Absolute: 0 10*3/uL (ref 0.0–0.1)
Basophils Relative: 0 %
EOS ABS: 0 10*3/uL (ref 0.0–0.7)
EOS PCT: 0 %
HCT: 47.3 % (ref 39.0–52.0)
Hemoglobin: 16.1 g/dL (ref 13.0–17.0)
Lymphocytes Relative: 9 %
Lymphs Abs: 1 10*3/uL (ref 0.7–4.0)
MCH: 32.9 pg (ref 26.0–34.0)
MCHC: 34 g/dL (ref 30.0–36.0)
MCV: 96.5 fL (ref 78.0–100.0)
MONO ABS: 0.9 10*3/uL (ref 0.1–1.0)
MONOS PCT: 9 %
Neutro Abs: 8.8 10*3/uL — ABNORMAL HIGH (ref 1.7–7.7)
Neutrophils Relative %: 82 %
PLATELETS: 126 10*3/uL — AB (ref 150–400)
RBC: 4.9 MIL/uL (ref 4.22–5.81)
RDW: 13.5 % (ref 11.5–15.5)
WBC: 10.8 10*3/uL — AB (ref 4.0–10.5)

## 2017-08-31 LAB — COMPREHENSIVE METABOLIC PANEL
ALT: 26 U/L (ref 17–63)
AST: 23 U/L (ref 15–41)
Albumin: 4.2 g/dL (ref 3.5–5.0)
Alkaline Phosphatase: 63 U/L (ref 38–126)
Anion gap: 9 (ref 5–15)
BUN: 14 mg/dL (ref 6–20)
CHLORIDE: 103 mmol/L (ref 101–111)
CO2: 25 mmol/L (ref 22–32)
CREATININE: 1.07 mg/dL (ref 0.61–1.24)
Calcium: 8.9 mg/dL (ref 8.9–10.3)
GFR calc Af Amer: >60 mL/min (ref >60)
Glucose, Bld: 114 mg/dL — ABNORMAL HIGH (ref 65–99)
Potassium: 3.9 mmol/L (ref 3.5–5.1)
Sodium: 137 mmol/L (ref 135–145)
Total Bilirubin: 0.8 mg/dL (ref 0.3–1.2)
Total Protein: 7.5 g/dL (ref 6.5–8.1)

## 2017-08-31 LAB — URINALYSIS, ROUTINE W REFLEX MICROSCOPIC
Bilirubin Urine: NEGATIVE
GLUCOSE, UA: NEGATIVE mg/dL
HGB URINE DIPSTICK: NEGATIVE
KETONES UR: NEGATIVE mg/dL
Leukocytes, UA: NEGATIVE
Nitrite: NEGATIVE
PROTEIN: NEGATIVE mg/dL
Specific Gravity, Urine: 1.02 (ref 1.005–1.030)
pH: 6 (ref 5.0–8.0)

## 2017-08-31 LAB — LIPASE, BLOOD: LIPASE: 72 U/L — AB (ref 11–51)

## 2017-08-31 MED ORDER — METRONIDAZOLE IN NACL 5-0.79 MG/ML-% IV SOLN
500.0000 mg | Freq: Once | INTRAVENOUS | Status: AC
  Administered 2017-08-31: 500 mg via INTRAVENOUS
  Filled 2017-08-31: qty 100

## 2017-08-31 MED ORDER — SODIUM CHLORIDE 0.9 % IV BOLUS (SEPSIS)
1000.0000 mL | Freq: Once | INTRAVENOUS | Status: AC
  Administered 2017-08-31: 1000 mL via INTRAVENOUS

## 2017-08-31 MED ORDER — METRONIDAZOLE 500 MG PO TABS: 500.0000 mg | ORAL_TABLET | Freq: Three times a day (TID) | ORAL | 0 refills | Status: DC

## 2017-08-31 MED ORDER — CIPROFLOXACIN HCL 500 MG PO TABS: 500.0000 mg | ORAL_TABLET | Freq: Two times a day (BID) | ORAL | 0 refills | Status: DC

## 2017-08-31 MED ORDER — ONDANSETRON HCL 4 MG PO TABS: 4.0000 mg | ORAL_TABLET | Freq: Three times a day (TID) | ORAL | 0 refills | Status: DC | PRN

## 2017-08-31 MED ORDER — IOPAMIDOL (ISOVUE-300) INJECTION 61%
100.0000 mL | Freq: Once | INTRAVENOUS | Status: AC | PRN
  Administered 2017-08-31: 100 mL via INTRAVENOUS

## 2017-08-31 MED ORDER — CIPROFLOXACIN IN D5W 400 MG/200ML IV SOLN
400.0000 mg | Freq: Once | INTRAVENOUS | Status: AC
  Administered 2017-08-31: 400 mg via INTRAVENOUS
  Filled 2017-08-31: qty 200

## 2017-08-31 MED ORDER — ACETAMINOPHEN 500 MG PO TABS
1000.0000 mg | ORAL_TABLET | Freq: Once | ORAL | Status: AC
Start: 2017-08-31 — End: 2017-08-31
  Administered 2017-08-31: 1000 mg via ORAL
  Filled 2017-08-31: qty 2

## 2017-08-31 NOTE — ED Provider Notes (Signed)
[place] EMERGENCY DEPARTMENT Provider Note   CSN: [id number] Arrival date & time: 08/31/17  1456     History   Chief Complaint Chief Complaint  Patient presents with  . Diarrhea    HPI Carlos Thomas is a 67 y.o. male.  The history is provided by the patient and a significant other. No language interpreter was used.  Diarrhea      Carlos Thomas is a 67 y.o. male who presents to the Emergency Department complaining of vomiting/diarrhea.  He presents for evaluation of vomiting and diarrhea.  Yesterday he began feeling poorly with generalized malaise and 3 loose stools.  At 2 AM this morning he developed shaking chills with nausea.  Around 2:00 this afternoon he had recurrent shaking chills and emesis x1.  He had a temperature to 99.4 at home.  He does endorse associated central abdominal pain that is described as mild and constant in nature.  No chest pain, shortness of breath, sore throat, cough, dysuria.  He has a history of atrial fibrillation, status post ablation, currently anticoagulated on Xarelto.  No known sick contacts or bad food exposures.  He does have a history of hiatal hernia surgery as well as cholecystectomy.  Past Medical History:  Diagnosis Date  . Anticoagulant long-term use    xarelto  . Balanitis   . GERD (gastroesophageal reflux disease)   . History of adenomatous polyp of colon    2006  tubular adenoma's and hyperplastic polyp's;   2011  tubular adenoma  . History of atrial flutter    s/p ablation atrial flutter/atrial fib in 2010  . History of MRSA infection    1990's  infected wound near surgerical area   . History of repair of hiatal hernia   . HTN (hypertension)   . Obesity (BMI 30-39.9)   . OSA on CPAP    Mild OSA  per study 05-07-2012-- w AHI 6.57/hr now on 13cm H2O  . PAF (paroxysmal atrial fibrillation) [facility]) primary cardiologist-  dr Marlou Porch   ep cardiologist-  dr Rayann Heman---  s/p  ablation afib and aflutter 2010  and  ablation  afib/svt  2013  . Peripheral neuropathy, hereditary/idiopathic    bilateral feet---  neurologist- dr patel  . 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 S/P radiofrequency ablation operation for arrhythmia    01-09-2009  cardiac ablation afib/flutter;   09-13-2011  ablation afib/svt and in ther right atrium  . Wears glasses     Patient Active Problem List   Diagnosis Date Noted  . Hereditary and idiopathic peripheral neuropathy 06/03/2016  . URI (upper respiratory infection) 05/27/2014  . Obesity (BMI 30-39.9)   . OSA (obstructive sleep apnea) 06/11/2013  . Essential hypertension 02/07/2009  . PAF (paroxysmal atrial fibrillation) ([place]) 02/07/2009  . Atrial flutter ([place]) 02/07/2009    Past Surgical History:  Procedure Laterality Date  . ATRIAL FIBRILLATION ABLATION N/A 09/13/2011   Procedure: ATRIAL FIBRILLATION ABLATION;  Surgeon: Thompson Grayer, MD;  Location: [facility] CATH LAB;  Service: Cardiovascular;  Laterality: N/A;  . CARDIAC ELECTROPHYSIOLOGY MAPPING AND ABLATION  [phone]   dr allred   successful ablation atrial fibrillation /  atrial flutter  . CARDIAC ELECTROPHYSIOLOGY MAPPING AND ABLATION  09-13-2011   dr Rayann Heman   afib mapping/ ablation and  mapping/ablation in the right atrium  . CARDIOVASCULAR STRESS TEST  09/2006   per dr Marlou Porch note nuclear study normal w/ no ischemia  . CARDIOVERSION N/A 09/21/2015  Procedure: CARDIOVERSION;  Surgeon: Lelon Perla, MD;  Location: [facility] ENDOSCOPY;  Service: Cardiovascular;  Laterality: N/A;  . CIRCUMCISION N/A 06/27/2016   Procedure: CIRCUMCISION ADULT;  Surgeon: Carolan Clines, MD;  Location: [facility];  Service: Urology;  Laterality: N/A;  . COLONOSCOPY  last one 02/  2011  . DIRECT CURRENT CARDIOVERSION  02/02/2010   dr Leonia Reeves  . LAPAROSCOPIC CHOLECYSTECTOMY  1990's  . OPEN HIATAL HERNIA REPAIR  1970's  . TEE WITHOUT CARDIOVERSION  09/12/2011   Procedure: TRANSESOPHAGEAL  ECHOCARDIOGRAM (TEE);  Surgeon: Jettie Booze, MD;  Location: [facility] ENDOSCOPY;  Service: Cardiovascular;  Laterality: N/A;  trace AR, no LA/LAA thrombus, normal LVfunction, moderate descending aortic atherosclerosis  . TEE WITHOUT CARDIOVERSION N/A 09/21/2015   Procedure: TRANSESOPHAGEAL ECHOCARDIOGRAM (TEE);  Surgeon: Lelon Perla, MD;  Location: [facility] ENDOSCOPY;  Service: Cardiovascular;  Laterality: N/A; normal LV systolic funciton,  moderate LAE,  no LAA thrombus,  trace MR and TR  . TRANSTHORACIC ECHOCARDIOGRAM  06/03/2014   ef 55-60%/  trivial MR and TR       Home Medications    Prior to Admission medications   Medication Sig Start Date End Date Taking? Authorizing Provider  ciprofloxacin (CIPRO) 500 MG tablet Take 1 tablet (500 mg total) by mouth every 12 (twelve) hours. 08/31/17   Quintella Reichert, MD  diltiazem (CARTIA XT) 240 MG 24 hr capsule TAKE 1 CAPSULE(240 MG) BY MOUTH DAILY 06/17/17   Sherran Needs, NP  flecainide (TAMBOCOR) 100 MG tablet TAKE 1 TABLET(100 MG) BY MOUTH TWICE DAILY 06/17/17   Sherran Needs, NP  metroNIDAZOLE (FLAGYL) 500 MG tablet Take 1 tablet (500 mg total) by mouth 3 (three) times daily. 08/31/17   Quintella Reichert, MD  omeprazole (PRILOSEC) 20 MG capsule Take 20 mg by mouth every morning.     [provider]  ondansetron (ZOFRAN) 4 MG tablet Take 1 tablet (4 mg total) by mouth every 8 (eight) hours as needed for nausea or vomiting. 08/31/17   Quintella Reichert, MD  rivaroxaban (XARELTO) 20 MG TABS tablet TAKE 1 TABLET(20 MG) BY MOUTH DAILY WITH SUPPER 06/17/17   Sherran Needs, NP  tamsulosin (FLOMAX) 0.4 MG CAPS capsule Take 0.4 mg by mouth daily.    [provider]    Family History Family History  Problem Relation Age of Onset  . CAD Father   . Heart attack Father   . Atrial fibrillation Neg Hx   . Anesthesia problems Neg Hx     Social History Social History   Tobacco Use  . Smoking status: Never Smoker  . Smokeless  tobacco: Never Used  Substance Use Topics  . Alcohol use: No    Alcohol/week: 0.6 oz    Types: 1 Cans of beer per week    Comment: rare  . Drug use: No     Allergies   Patient has no known allergies.   Review of Systems Review of Systems  Gastrointestinal: Positive for diarrhea.  All other systems reviewed and are negative.    Physical Exam Updated Vital Signs BP 123/64 (BP Location: Right Arm)   Pulse 64   Temp 99.6 F (37.6 C) (Oral)   Resp 20   Ht 5\' 11"  (1.803 m)   Wt 106.6 kg (235 lb)   SpO2 97%   BMI 32.78 kg/m   Physical Exam  Constitutional: He is oriented to person, place, and time. He appears well-developed and well-nourished.  HENT:  Head: Normocephalic and atraumatic.  Cardiovascular: Normal rate and regular rhythm.  No murmur heard. Pulmonary/Chest: Effort normal and breath sounds normal. No respiratory distress.  Abdominal:  Mildly distended.  Mild generalized abdominal tenderness without any guarding or rebound tenderness.  Normoactive bowel sounds.  Healed midline abdominal incision.  Musculoskeletal: He exhibits no edema or tenderness.  Neurological: He is alert and oriented to person, place, and time.  Skin: Skin is warm and dry.  Psychiatric: He has a normal mood and affect. His behavior is normal.  Nursing note and vitals reviewed.    ED Treatments / Results  Labs (all labs ordered are listed, but only abnormal results are displayed) Labs Reviewed  COMPREHENSIVE METABOLIC PANEL - Abnormal; Notable for the following components:      Result Value   Glucose, Bld 114 (*)    All other components within normal limits  CBC WITH DIFFERENTIAL/PLATELET - Abnormal; Notable for the following components:   WBC 10.8 (*)    Platelets 126 (*)    Neutro Abs 8.8 (*)    All other components within normal limits  LIPASE, BLOOD - Abnormal; Notable for the following components:   Lipase 72 (*)    All other components within normal limits    GASTROINTESTINAL PANEL BY PCR, STOOL (REPLACES STOOL CULTURE)  URINALYSIS, ROUTINE W REFLEX MICROSCOPIC    EKG  EKG Interpretation  Date/Time:  Sunday August 31 2017 15:11:31 EST Ventricular Rate:  85 PR Interval:  162 QRS Duration: 88 QT Interval:  340 QTC Calculation: 404 R Axis:   138 Text Interpretation:  Normal sinus rhythm Left posterior fascicular block Abnormal ECG Confirmed by Quintella Reichert [phone]) on 08/31/2017 4:44:07 PM       Radiology Ct Abdomen Pelvis W Contrast  Result Date: 08/31/2017 CLINICAL DATA:  Patient with generalized abdominal pain for 2 days. EXAM: CT ABDOMEN AND PELVIS WITH CONTRAST TECHNIQUE: Multidetector CT imaging of the abdomen and pelvis was performed using the standard protocol following bolus administration of intravenous contrast. CONTRAST:  143mL ISOVUE-300 IOPAMIDOL (ISOVUE-300) INJECTION 61% COMPARISON:  Chest radiograph 10/20/2016 FINDINGS: Lower chest: Normal heart size. No pericardial effusion. Dependent atelectasis within the bilateral lower lobes. Hepatobiliary: Liver is normal in size and contour. Multiple cysts are demonstrated within the liver. Patient status post cholecystectomy. Additionally there is a 9 mm low-attenuation lesion within the lateral left hepatic lobe (image 20; series 2), too small to characterize. Pancreas: Mild fatty atrophy of the pancreas. Spleen: Unremarkable Adrenals/Urinary Tract: Adrenal glands are normal. There is a partially exophytic 2.9 cm mass superior pole right kidney with an internal density of 55 Hounsfield units (image 25; series 2). There is a 2.1 cm cyst within the interpolar region of the left kidney. Urinary bladder is unremarkable. Stomach/Bowel: Descending and sigmoid colonic diverticulosis. No CT evidence for acute diverticulitis. There is circumferential wall thickening of the cecum and ascending colon. Normal morphology of the stomach. No small bowel obstruction. No free fluid or free intraperitoneal air.  Vascular/Lymphatic: Normal caliber abdominal aorta. Peripheral calcified atherosclerotic plaque. No retroperitoneal lymphadenopathy. Reproductive: Prostate is unremarkable. Other: Small fat containing bilateral inguinal hernias. Probable small lymph node versus small amount of fluid within the right inguinal hernia (image 91; series 2). Musculoskeletal: Lumbar spine degenerative changes. No aggressive or acute appearing osseous lesions. IMPRESSION: 1. Mild wall thickening of the cecum and ascending colon most compatible with colitis. 2. There is a partially exophytic indeterminate mass within the superior pole of the right kidney concerning for the possibility of solid renal neoplasm. This  needs dedicated evaluation with pre and post contrast-enhanced CT or MRI in the non acute setting. 3. Multiple hepatic cysts. Additionally there is an indeterminate low-attenuation lesion within the left hepatic lobe. 4. These results were called by telephone at the time of interpretation on 08/31/2017 at 5:17 pm to Dr. Quintella Reichert , who verbally acknowledged these results. Electronically Signed   By: Lovey Newcomer M.D.   On: 08/31/2017 17:33    Procedures Procedures (including critical care time)  Medications Ordered in ED Medications  sodium chloride 0.9 % bolus 1,000 mL (0 mLs Intravenous Stopped 08/31/17 1700)  iopamidol (ISOVUE-300) 61 % injection 100 mL (100 mLs Intravenous Contrast Given 08/31/17 1646)  ciprofloxacin (CIPRO) IVPB 400 mg (0 mg Intravenous Stopped 08/31/17 2128)  metroNIDAZOLE (FLAGYL) IVPB 500 mg (0 mg Intravenous Stopped 08/31/17 2128)  acetaminophen (TYLENOL) tablet 1,000 mg (1,000 mg Oral Given 08/31/17 2036)     Initial Impression / Assessment and Plan / ED Course  I have reviewed the triage vital signs and the nursing notes.  Pertinent labs & imaging results that were available during my care of the patient were reviewed by me and considered in my medical decision making (see chart for  details).     Patient here for evaluation of mild abdominal pain, diarrhea, nausea, vomiting, fevers.  He has had recurrent diarrhea in the emergency department but no recurrent nausea.  He is able to tolerate oral fluids.  He states he has minimal tenderness but does appear tender on examination.  Discussed with patient given his stable vital signs and CT scan findings option for inpatient observation versus discharge home with close return precautions.  Patient prefers home with return precautions.  Will treat with one-time dose of antibiotics in the emergency department with prescriptions to continue at home.  Discussed importance of returning if his symptoms progress or worsen.  Also discussed with patient lesion on his right kidney that needs further evaluation.  Final Clinical Impressions(s) / ED Diagnoses   Final diagnoses:  Acute colitis  Renal mass    ED Discharge Orders        Ordered    ciprofloxacin (CIPRO) 500 MG tablet  Every 12 hours     08/31/17 2101    metroNIDAZOLE (FLAGYL) 500 MG tablet  3 times daily     08/31/17 2101    ondansetron (ZOFRAN) 4 MG tablet  Every 8 hours PRN     08/31/17 2101       Quintella Reichert, MD 09/01/17 0013

## 2017-08-31 NOTE — ED Notes (Signed)
ED Provider at bedside. 

## 2017-08-31 NOTE — ED Notes (Signed)
Pt and wife given d/c instructions as per chart. Rx x 3. Verbalizes understanding. No questions.

## 2017-08-31 NOTE — ED Triage Notes (Signed)
Patient states that he has had "tremors" intermittently last night into today  - the apteint states that some are in his chest as well. The patient reports that he has had diarrhea x 3 days and vomited today

## 2017-08-31 NOTE — ED Notes (Signed)
Not in room

## 2017-08-31 NOTE — ED Notes (Signed)
Waiting on labs before CT

## 2017-08-31 NOTE — ED Notes (Signed)
Patient was given Ginger Ale. Patient will notify us if he gets nauseous.

## 2017-08-31 NOTE — Discharge Instructions (Signed)
There is a growth on your right kidney that will need to be followed up by your family doctor for further imaging.  Drink plenty of fluids.  Get rechecked immediately if you develop severe pain, uncontrolled vomiting, or new concerning symptoms.

## 2017-08-31 NOTE — ED Notes (Signed)
Alert, NAD, calm, interactive, resps e/u, speaking in clear complete sentences, no dyspnea noted, skin W&D, VSS, states, "feel about the same", PB and crackers finished, (denies: sob, nausea, dizziness or visual changes).

## 2017-09-01 ENCOUNTER — Telehealth (HOSPITAL_BASED_OUTPATIENT_CLINIC_OR_DEPARTMENT_OTHER): Payer: Self-pay | Admitting: Emergency Medicine

## 2017-09-01 LAB — GASTROINTESTINAL PANEL BY PCR, STOOL (REPLACES STOOL CULTURE)
ADENOVIRUS F40/41: NOT DETECTED
ASTROVIRUS: NOT DETECTED
CYCLOSPORA CAYETANENSIS: NOT DETECTED
Cryptosporidium: NOT DETECTED
ENTEROAGGREGATIVE E COLI (EAEC): NOT DETECTED
ENTEROPATHOGENIC E COLI (EPEC): DETECTED — AB
ENTEROTOXIGENIC E COLI (ETEC): NOT DETECTED
Entamoeba histolytica: NOT DETECTED
GIARDIA LAMBLIA: NOT DETECTED
Norovirus GI/GII: NOT DETECTED
Plesimonas shigelloides: NOT DETECTED
Rotavirus A: NOT DETECTED
SAPOVIRUS (I, II, IV, AND V): NOT DETECTED
SHIGA LIKE TOXIN PRODUCING E COLI (STEC): NOT DETECTED
Salmonella species: NOT DETECTED
Shigella/Enteroinvasive E coli (EIEC): NOT DETECTED
VIBRIO CHOLERAE: NOT DETECTED
Vibrio species: NOT DETECTED
Yersinia enterocolitica: NOT DETECTED

## 2017-09-04 ENCOUNTER — Other Ambulatory Visit: Payer: Self-pay | Admitting: Family Medicine

## 2017-09-04 DIAGNOSIS — N2889 Other specified disorders of kidney and ureter: Secondary | ICD-10-CM

## 2017-09-05 LAB — MISC LABCORP TEST (SEND OUT): Labcorp test code: 180141

## 2017-09-14 ENCOUNTER — Ambulatory Visit
Admission: RE | Admit: 2017-09-14 | Discharge: 2017-09-14 | Disposition: A | Discharge status: Home / Self Care | Payer: Medicare Other | Source: Ambulatory Visit | Attending: Family Medicine | Admitting: Family Medicine

## 2017-09-14 DIAGNOSIS — N2889 Other specified disorders of kidney and ureter: Secondary | ICD-10-CM

## 2017-09-14 MED ORDER — GADOBENATE DIMEGLUMINE 529 MG/ML IV SOLN
20.0000 mL | Freq: Once | INTRAVENOUS | Status: AC | PRN
  Administered 2017-09-14: 20 mL via INTRAVENOUS

## 2017-10-11 ENCOUNTER — Emergency Department (HOSPITAL_BASED_OUTPATIENT_CLINIC_OR_DEPARTMENT_OTHER)
Admission: EM | Admit: 2017-10-11 | Discharge: 2017-10-11 | Disposition: A | Discharge status: Home / Self Care | Payer: Medicare Other | Attending: Emergency Medicine | Admitting: Emergency Medicine

## 2017-10-11 ENCOUNTER — Emergency Department (HOSPITAL_BASED_OUTPATIENT_CLINIC_OR_DEPARTMENT_OTHER): Payer: Medicare Other

## 2017-10-11 ENCOUNTER — Other Ambulatory Visit: Payer: Self-pay

## 2017-10-11 ENCOUNTER — Encounter (HOSPITAL_BASED_OUTPATIENT_CLINIC_OR_DEPARTMENT_OTHER): Payer: Self-pay

## 2017-10-11 DIAGNOSIS — I10 Essential (primary) hypertension: Secondary | ICD-10-CM | POA: Diagnosis not present

## 2017-10-11 DIAGNOSIS — I480 Paroxysmal atrial fibrillation: Secondary | ICD-10-CM

## 2017-10-11 DIAGNOSIS — Z79899 Other long term (current) drug therapy: Secondary | ICD-10-CM | POA: Insufficient documentation

## 2017-10-11 DIAGNOSIS — R002 Palpitations: Secondary | ICD-10-CM | POA: Insufficient documentation

## 2017-10-11 DIAGNOSIS — Z7901 Long term (current) use of anticoagulants: Secondary | ICD-10-CM | POA: Diagnosis not present

## 2017-10-11 LAB — CBC WITH DIFFERENTIAL/PLATELET
BASOS PCT: 0 %
Basophils Absolute: 0 10*3/uL (ref 0.0–0.1)
Eosinophils Absolute: 0.3 10*3/uL (ref 0.0–0.7)
Eosinophils Relative: 4 %
HCT: 49.4 % (ref 39.0–52.0)
Hemoglobin: 17.7 g/dL — ABNORMAL HIGH (ref 13.0–17.0)
LYMPHS ABS: 1.3 10*3/uL (ref 0.7–4.0)
Lymphocytes Relative: 21 %
MCH: 34.2 pg — AB (ref 26.0–34.0)
MCHC: 35.8 g/dL (ref 30.0–36.0)
MCV: 95.6 fL (ref 78.0–100.0)
Monocytes Absolute: 0.4 10*3/uL (ref 0.1–1.0)
Monocytes Relative: 7 %
NEUTROS PCT: 68 %
Neutro Abs: 4.2 10*3/uL (ref 1.7–7.7)
Platelets: 154 10*3/uL (ref 150–400)
RBC: 5.17 MIL/uL (ref 4.22–5.81)
RDW: 13.6 % (ref 11.5–15.5)
WBC: 6.1 10*3/uL (ref 4.0–10.5)

## 2017-10-11 LAB — COMPREHENSIVE METABOLIC PANEL
ALT: 40 U/L (ref 17–63)
ANION GAP: 9 (ref 5–15)
AST: 24 U/L (ref 15–41)
Albumin: 4.3 g/dL (ref 3.5–5.0)
Alkaline Phosphatase: 59 U/L (ref 38–126)
BILIRUBIN TOTAL: 1 mg/dL (ref 0.3–1.2)
BUN: 14 mg/dL (ref 6–20)
CO2: 22 mmol/L (ref 22–32)
Calcium: 8.6 mg/dL — ABNORMAL LOW (ref 8.9–10.3)
Chloride: 108 mmol/L (ref 101–111)
Creatinine, Ser: 1.03 mg/dL (ref 0.61–1.24)
GFR calc Af Amer: >60 mL/min (ref >60)
Glucose, Bld: 143 mg/dL — ABNORMAL HIGH (ref 65–99)
POTASSIUM: 3.9 mmol/L (ref 3.5–5.1)
Sodium: 139 mmol/L (ref 135–145)
TOTAL PROTEIN: 7.5 g/dL (ref 6.5–8.1)

## 2017-10-11 LAB — TROPONIN I

## 2017-10-11 NOTE — ED Notes (Signed)
Pt transported to and from XR

## 2017-10-11 NOTE — Discharge Instructions (Signed)
You were seen in the ED today with palpitations. You are in a-fib but your heart rate is normal. Take your home medications as instructed by your Cardiologist. Return to the ED with any chest pain, difficulty breathing, or rapid heart rate.

## 2017-10-11 NOTE — ED Provider Notes (Signed)
Emergency Department Provider Note   I have reviewed the triage vital signs and the nursing notes.   HISTORY  Chief Complaint Palpitations   HPI Carlos HUGHETT is a 67 y.o. male with PMH of PAF, GERD, and HTN presents to the emergency department for evaluation of intermittent palpitations consistent with atrial fibrillation.  Patient has a long history of paroxysmal A. fib and is anticoagulated on Xarelto.  He is compliant with his home Cardizem and flecainide.  He was having palpitations symptoms yesterday evening so took an additional dose of his Cardizem and flecainide last night.  He continued to have heart rate intermittently in the 140s at home along with some lightheadedness.  Denies any chest pain or shortness of breath.  This morning he had additional symptoms who presented to the emergency department after taking his morning dose of Cardizem and flecainide.  His cardiologist are Dr. Marlou Porch and Allred.  He has had 2 prior ablations for atrial fibrillation.  He has been compliant with his Xarelto. His last PO was a protein shake this AM for breakfast.    Past Medical History:  Diagnosis Date  . Anticoagulant long-term use    xarelto  . Balanitis   . GERD (gastroesophageal reflux disease)   . History of adenomatous polyp of colon    2006  tubular adenoma's and hyperplastic polyp's;   2011  tubular adenoma  . History of atrial flutter    s/p ablation atrial flutter/atrial fib in 2010  . History of MRSA infection    1990's  infected wound near surgerical area   . History of repair of hiatal hernia   . HTN (hypertension)   . Obesity (BMI 30-39.9)   . OSA on CPAP    Mild OSA  per study 05-07-2012-- w AHI 6.57/hr now on 13cm H2O  . PAF (paroxysmal atrial fibrillation) [facility]) primary cardiologist-  dr Marlou Porch   ep cardiologist-  dr Rayann Heman---  s/p  ablation afib and aflutter 2010  and  ablation afib/svt  2013  . Peripheral neuropathy, hereditary/idiopathic    bilateral feet---   neurologist- dr patel  . 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 S/P radiofrequency ablation operation for arrhythmia    01-09-2009  cardiac ablation afib/flutter;   09-13-2011  ablation afib/svt and in ther right atrium  . Wears glasses     Patient Active Problem List   Diagnosis Date Noted  . Hereditary and idiopathic peripheral neuropathy 06/03/2016  . URI (upper respiratory infection) 05/27/2014  . Obesity (BMI 30-39.9)   . OSA (obstructive sleep apnea) 06/11/2013  . Essential hypertension 02/07/2009  . PAF (paroxysmal atrial fibrillation) ([place]) 02/07/2009  . Atrial flutter ([place]) 02/07/2009    Past Surgical History:  Procedure Laterality Date  . ATRIAL FIBRILLATION ABLATION N/A 09/13/2011   Procedure: ATRIAL FIBRILLATION ABLATION;  Surgeon: Thompson Grayer, MD;  Location: [facility] CATH LAB;  Service: Cardiovascular;  Laterality: N/A;  . CARDIAC ELECTROPHYSIOLOGY MAPPING AND ABLATION  [phone]   dr allred   successful ablation atrial fibrillation /  atrial flutter  . CARDIAC ELECTROPHYSIOLOGY MAPPING AND ABLATION  09-13-2011   dr Rayann Heman   afib mapping/ ablation and  mapping/ablation in the right atrium  . CARDIOVASCULAR STRESS TEST  09/2006   per dr Marlou Porch note nuclear study normal w/ no ischemia  . CARDIOVERSION N/A 09/21/2015   Procedure: CARDIOVERSION;  Surgeon: Lelon Perla, MD;  Location: [facility] ENDOSCOPY;  Service: Cardiovascular;  Laterality: N/A;  .  CIRCUMCISION N/A 06/27/2016   Procedure: CIRCUMCISION ADULT;  Surgeon: Carolan Clines, MD;  Location[facility];  Service: Urology;  Laterality: N/A;  . COLONOSCOPY  last one 02/  2011  . DIRECT CURRENT CARDIOVERSION  02/02/2010   dr Leonia Reeves  . LAPAROSCOPIC CHOLECYSTECTOMY  1990's  . OPEN HIATAL HERNIA REPAIR  1970's  . TEE WITHOUT CARDIOVERSION  09/12/2011   Procedure: TRANSESOPHAGEAL ECHOCARDIOGRAM (TEE);  Surgeon: Jettie Booze, MD;  Location: [facility] ENDOSCOPY;  Service:  Cardiovascular;  Laterality: N/A;  trace AR, no LA/LAA thrombus, normal LVfunction, moderate descending aortic atherosclerosis  . TEE WITHOUT CARDIOVERSION N/A 09/21/2015   Procedure: TRANSESOPHAGEAL ECHOCARDIOGRAM (TEE);  Surgeon: Lelon Perla, MD;  Location: [facility] ENDOSCOPY;  Service: Cardiovascular;  Laterality: N/A; normal LV systolic funciton,  moderate LAE,  no LAA thrombus,  trace MR and TR  . TRANSTHORACIC ECHOCARDIOGRAM  06/03/2014   ef 55-60%/  trivial MR and TR    Current Outpatient Rx  . Order #: [id number] Class: Normal  . Order #: [id number] Class: Normal  . Order #: [id number] Class: Historical Med  . Order #: [id number] Class: Normal  . Order #: [id number] Class: Historical Med  . Order #: [id number] Class: Print  . Order #: [id number] Class: Print  . Order #: [id number] Class: Print    Allergies Patient has no known allergies.  Family History  Problem Relation Age of Onset  . CAD Father   . Heart attack Father   . Atrial fibrillation Neg Hx   . Anesthesia problems Neg Hx     Social History Social History   Tobacco Use  . Smoking status: Never Smoker  . Smokeless tobacco: Never Used  Substance Use Topics  . Alcohol use: No    Alcohol/week: 0.6 oz    Types: 1 Cans of beer per week    Comment: rare  . Drug use: No    Review of Systems  Constitutional: No fever/chills Eyes: No visual changes. ENT: No sore throat. Cardiovascular: Denies chest pain. Positive palpitations.  Respiratory: Denies shortness of breath. Gastrointestinal: No abdominal pain.  No nausea, no vomiting.  No diarrhea.  No constipation. Genitourinary: Negative for dysuria. Musculoskeletal: Negative for back pain. Skin: Negative for rash. Neurological: Negative for headaches, focal weakness or numbness.  10-point ROS otherwise negative.  ____________________________________________   PHYSICAL EXAM:  VITAL SIGNS: ED Triage Vitals  Enc Vitals Group     BP 10/11/17 0922 111/90     Pulse Rate  10/11/17 0922 100     Resp 10/11/17 0922 15     Temp 10/11/17 0922 98 F (36.7 C)     Temp Source 10/11/17 0922 Oral     SpO2 10/11/17 0922 97 %     Weight 10/11/17 0923 230 lb (104.3 kg)     Height 10/11/17 0923 5\' 11"  (1.803 m)     Pain Score 10/11/17 0922 0   Constitutional: Alert and oriented. Well appearing and in no acute distress. Eyes: Conjunctivae are normal.  Head: Atraumatic. Nose: No congestion/rhinnorhea. Mouth/Throat: Mucous membranes are moist.  Oropharynx non-erythematous. Neck: No stridor.   Cardiovascular: Irregularly irregular. Good peripheral circulation. Grossly normal heart sounds.   Respiratory: Normal respiratory effort.  No retractions. Lungs CTAB. Gastrointestinal: Soft and nontender. No distention.  Musculoskeletal: No lower extremity tenderness nor edema. No gross deformities of extremities. Neurologic:  Normal speech and language. No gross focal neurologic deficits are appreciated.  Skin:  Skin is warm, dry and intact. No rash noted.  ____________________________________________   LABS (all labs ordered  are listed, but only abnormal results are displayed)  Labs Reviewed  CBC WITH DIFFERENTIAL/PLATELET - Abnormal; Notable for the following components:      Result Value   Hemoglobin 17.7 (*)    MCH 34.2 (*)    All other components within normal limits  COMPREHENSIVE METABOLIC PANEL - Abnormal; Notable for the following components:   Glucose, Bld 143 (*)    Calcium 8.6 (*)    All other components within normal limits  TROPONIN I   ____________________________________________  EKG   EKG Interpretation  Date/Time:  Saturday October 11 2017 09:15:23 EDT Ventricular Rate:  73 PR Interval:    QRS Duration: 110 QT Interval:  377 QTC Calculation: 416 R Axis:   164 Text Interpretation:  Atrial fibrillation Left posterior fascicular block Borderline low voltage, extremity leads Abnormal R-wave progression, late transition No STEMI.  Confirmed by  Nanda Quinton [phone]) on 10/11/2017 9:18:45 AM Also confirmed by Nanda Quinton [phone]), editor Philomena Doheny [phone])  on 10/11/2017 9:58:12 AM       ____________________________________________  RADIOLOGY  Dg Chest 2 View  Result Date: 10/11/2017 CLINICAL DATA:  67 year old male with a history of tachycardia. Fiber fighter. History of atrial fibrillation EXAM: CHEST - 2 VIEW COMPARISON:  10/20/2016, 10/28/2014 FINDINGS: Cardiomediastinal silhouette unchanged in size and contour. No evidence of pneumothorax or pleural effusion. No confluent airspace disease. Similar appearance of chronic interstitial opacities, particularly at the lung bases. Degenerative changes of the spine. IMPRESSION: Chronic lung changes without evidence of superimposed acute cardiopulmonary disease Electronically Signed   By: Corrie Mckusick D.O.   On: 10/11/2017 10:19    ____________________________________________   PROCEDURES  Procedure(s) performed:   Procedures  None ____________________________________________   INITIAL IMPRESSION / ASSESSMENT AND PLAN / ED COURSE  Pertinent labs & imaging results that were available during my care of the patient were reviewed by me and considered in my medical decision making (see chart for details).  Patient presents to the emergency department with palpitations.  He had episodes of A. fib RVR at home clinically.  He took additional Cardizem and flecainide last night along with his normal morning dosing of those medications.  On arrival to the emergency department his symptoms have improved.  His heart rate is in the 80s-90s here. No CP.  I do not think the patient is a candidate for emergency cardioversion at this time.  Plan for baseline labs and further ED monitoring.   10:34 AM Patient's labs and chest x-ray are reviewed with no acute findings.  Heart rate remains in the 70s-80s.  He is still in atrial fibrillation.  He is anticoagulated.  I feel that the risk of  cardioverting this patient with no symptoms and rate controlled A. Fib is greater than any potential benefit.  I advised him to continue taking his rate control medications as prescribed at home and call his cardiologist on Monday for a follow-up appointment.  Discussed return precautions in detail.   At this time, I do not feel there is any life-threatening condition present. I have reviewed and discussed all results (EKG, imaging, lab, urine as appropriate), exam findings with patient. I have reviewed nursing notes and appropriate previous records.  I feel the patient is safe to be discharged home without further emergent workup. Discussed usual and customary return precautions. Patient and family (if present) verbalize understanding and are comfortable with this plan.  Patient will follow-up with their primary care provider. If they do not have a primary care provider,  information for follow-up has been provided to them. All questions have been answered.  ____________________________________________  FINAL CLINICAL IMPRESSION(S) / ED DIAGNOSES  Final diagnoses:  Palpitations  PAF (paroxysmal atrial fibrillation) ([place])    Note:  This document was prepared using Dragon voice recognition software and may include unintentional dictation errors.  Nanda Quinton, MD Emergency Medicine    Long, [facility], MD 10/11/17 [phone]

## 2017-10-11 NOTE — ED Notes (Signed)
ED Provider at bedside. 

## 2017-10-11 NOTE — ED Notes (Signed)
Pt sts he felt himself go into atrial fibrillation yesterday afternoon. Pt has history of same. Pt took 240 cardizem at 0800 and 1800 last night. Also took 100mg  flecainide at 0800 and 1630. Pt reports HR 140 bpm from 2100 last night to 0830 this morning. Pts pulse regular, strong and tachycardic upon arrival. Once placed on leads, pt was then in a-fib with HR 80-100, irregular. Reports some dizziness this morning when he got up to walk. Denies any complaints at this time. Sts last cardioversion was here approximately 6-8 months ago. Last ablation was 2014. Last saw cardiology 2-3 months ago. Pt has had episodes at home previously, but was able to maintain his rate at home. Unable to maintain with medication regimen today.

## 2017-10-12 ENCOUNTER — Emergency Department (HOSPITAL_BASED_OUTPATIENT_CLINIC_OR_DEPARTMENT_OTHER): Payer: Medicare Other

## 2017-10-12 ENCOUNTER — Emergency Department (HOSPITAL_BASED_OUTPATIENT_CLINIC_OR_DEPARTMENT_OTHER)
Admission: EM | Admit: 2017-10-12 | Discharge: 2017-10-12 | Disposition: A | Discharge status: Home / Self Care | Payer: Medicare Other | Attending: Emergency Medicine | Admitting: Emergency Medicine

## 2017-10-12 ENCOUNTER — Encounter (HOSPITAL_BASED_OUTPATIENT_CLINIC_OR_DEPARTMENT_OTHER): Payer: Self-pay | Admitting: Adult Health

## 2017-10-12 ENCOUNTER — Other Ambulatory Visit: Payer: Self-pay

## 2017-10-12 DIAGNOSIS — Z7901 Long term (current) use of anticoagulants: Secondary | ICD-10-CM | POA: Insufficient documentation

## 2017-10-12 DIAGNOSIS — Z79899 Other long term (current) drug therapy: Secondary | ICD-10-CM | POA: Insufficient documentation

## 2017-10-12 DIAGNOSIS — I4891 Unspecified atrial fibrillation: Secondary | ICD-10-CM | POA: Insufficient documentation

## 2017-10-12 DIAGNOSIS — I10 Essential (primary) hypertension: Secondary | ICD-10-CM | POA: Diagnosis not present

## 2017-10-12 DIAGNOSIS — R000 Tachycardia, unspecified: Secondary | ICD-10-CM | POA: Diagnosis present

## 2017-10-12 MED ORDER — PROPOFOL 10 MG/ML IV BOLUS: INTRAVENOUS | Status: AC | PRN

## 2017-10-12 MED ORDER — PROPOFOL 10 MG/ML IV BOLUS
INTRAVENOUS | Status: AC
  Administered 2017-10-12: 100 mg
  Filled 2017-10-12: qty 20

## 2017-10-12 MED ORDER — DILTIAZEM HCL ER COATED BEADS 240 MG PO CP24: ORAL_CAPSULE | ORAL | 6 refills | Status: DC

## 2017-10-12 MED ORDER — DILTIAZEM HCL ER COATED BEADS 120 MG PO CP24: 120.0000 mg | ORAL_CAPSULE | Freq: Every day | ORAL | 0 refills | Status: DC

## 2017-10-12 NOTE — Sedation Documentation (Signed)
Pt cardioverted at 200 joules per Dr. Winfred Leeds, synced at

## 2017-10-12 NOTE — Discharge Instructions (Addendum)
Please your Cardizem dose to 360 mg daily starting tomorrow.  Call the atrial fibrillation clinic tomorrow to schedule the next available appointment.

## 2017-10-12 NOTE — ED Provider Notes (Signed)
[facility] EMERGENCY DEPARTMENT Provider Note   CSN: [id number] Arrival date & time: 10/12/17  0935     History   Chief Complaint Chief Complaint  Patient presents with  . Tachycardia    HPI Carlos Thomas is a 67 y.o. male. Patient reports tachycardia and irregular heartbeat onset 4:30 PM 2 days ago.  And feeling of atrial fibrillation denies chest pain denies lightheadedness.  No other associated symptoms.  Seen here yesterday for same complaint.  No change in treatment regimen made.  Returns today as his heart rate was noted to be fast this morning and he has been compliant with Xarelto HPI  Past Medical History:  Diagnosis Date  . Anticoagulant long-term use    xarelto  . Balanitis   . GERD (gastroesophageal reflux disease)   . History of adenomatous polyp of colon    2006  tubular adenoma's and hyperplastic polyp's;   2011  tubular adenoma  . History of atrial flutter    s/p ablation atrial flutter/atrial fib in 2010  . History of MRSA infection    1990's  infected wound near surgerical area   . History of repair of hiatal hernia   . HTN (hypertension)   . Obesity (BMI 30-39.9)   . OSA on CPAP    Mild OSA  per study 05-07-2012-- w AHI 6.57/hr now on 13cm H2O  . PAF (paroxysmal atrial fibrillation) [facility]) primary cardiologist-  dr Marlou Porch   ep cardiologist-  dr Rayann Heman---  s/p  ablation afib and aflutter 2010  and  ablation afib/svt  2013  . Peripheral neuropathy, hereditary/idiopathic    bilateral feet---  neurologist- dr patel  . 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 S/P radiofrequency ablation operation for arrhythmia    01-09-2009  cardiac ablation afib/flutter;   09-13-2011  ablation afib/svt and in ther right atrium  . Wears glasses     Patient Active Problem List   Diagnosis Date Noted  . Hereditary and idiopathic peripheral neuropathy 06/03/2016  . URI (upper respiratory infection) 05/27/2014  . Obesity (BMI  30-39.9)   . OSA (obstructive sleep apnea) 06/11/2013  . Essential hypertension 02/07/2009  . PAF (paroxysmal atrial fibrillation) ([place]) 02/07/2009  . Atrial flutter ([place]) 02/07/2009    Past Surgical History:  Procedure Laterality Date  . ATRIAL FIBRILLATION ABLATION N/A 09/13/2011   Procedure: ATRIAL FIBRILLATION ABLATION;  Surgeon: Thompson Grayer, MD;  [facility] C[facility] LAB;  Service: Cardiovascular;  Laterality: N/A;  . CARDIAC ELECTROPHYSIOLOGY MAPPING AND ABLATION  [phone]   dr allred   successful ablation atrial fibrillation /  atrial flutter  . CARDIAC ELECTROPHYSIOLOGY MAPPING AND ABLATION  09-13-2011   dr Rayann Heman   afib mapping/ ablation and  mapping/ablation in the right atrium  . CARDIOVASCULAR STRESS TEST  09/2006   per dr Marlou Porch note nuclear study normal w/ no ischemia  . CARDIOVERSION N/A 09/21/2015   Procedure: CARDIOVERSION;  Surgeon: Lelon Perla, MD;  Location: [facility] ENDOSCOPY;  Service: Cardiovascular;  Laterality: N/A;  . CIRCUMCISION N/A 06/27/2016   Procedure: CIRCUMCISION ADULT;  Surgeon: Carolan Clines, MD;  Location: [facility];  Service: Urology;  Laterality: N/A;  . COLONOSCOPY  last one 02/  2011  . DIRECT CURRENT CARDIOVERSION  02/02/2010   dr Leonia Reeves  . LAPAROSCOPIC CHOLECYSTECTOMY  1990's  . OPEN HIATAL HERNIA REPAIR  1970's  . TEE WITHOUT CARDIOVERSION  09/12/2011   Procedure: TRANSESOPHAGEAL ECHOCARDIOGRAM (TEE);  Surgeon: Jettie Booze,  MD;  Location: [facility] ENDOSCOPY;  Service: Cardiovascular;  Laterality: N/A;  trace AR, no LA/LAA thrombus, normal LVfunction, moderate descending aortic atherosclerosis  . TEE WITHOUT CARDIOVERSION N/A 09/21/2015   Procedure: TRANSESOPHAGEAL ECHOCARDIOGRAM (TEE);  Surgeon: Lelon Perla, MD;  Location: [facility] ENDOSCOPY;  Service: Cardiovascular;  Laterality: N/A; normal LV systolic funciton,  moderate LAE,  no LAA thrombus,  trace MR and TR  . TRANSTHORACIC ECHOCARDIOGRAM  06/03/2014   ef 55-60%/   trivial MR and TR        Home Medications    Prior to Admission medications   Medication Sig Start Date End Date Taking? Authorizing Provider  ciprofloxacin (CIPRO) 500 MG tablet Take 1 tablet (500 mg total) by mouth every 12 (twelve) hours. 08/31/17   Quintella Reichert, MD  diltiazem (CARTIA XT) 240 MG 24 hr capsule TAKE 1 CAPSULE(240 MG) BY MOUTH DAILY 06/17/17   Sherran Needs, NP  flecainide (TAMBOCOR) 100 MG tablet TAKE 1 TABLET(100 MG) BY MOUTH TWICE DAILY 06/17/17   Sherran Needs, NP  metroNIDAZOLE (FLAGYL) 500 MG tablet Take 1 tablet (500 mg total) by mouth 3 (three) times daily. 08/31/17   Quintella Reichert, MD  omeprazole (PRILOSEC) 20 MG capsule Take 20 mg by mouth every morning.     [provider]  ondansetron (ZOFRAN) 4 MG tablet Take 1 tablet (4 mg total) by mouth every 8 (eight) hours as needed for nausea or vomiting. 08/31/17   Quintella Reichert, MD  rivaroxaban (XARELTO) 20 MG TABS tablet TAKE 1 TABLET(20 MG) BY MOUTH DAILY WITH SUPPER 06/17/17   Sherran Needs, NP  tamsulosin (FLOMAX) 0.4 MG CAPS capsule Take 0.4 mg by mouth daily.    [provider]    Family History Family History  Problem Relation Age of Onset  . CAD Father   . Heart attack Father   . Atrial fibrillation Neg Hx   . Anesthesia problems Neg Hx     Social History Social History   Tobacco Use  . Smoking status: Never Smoker  . Smokeless tobacco: Never Used  Substance Use Topics  . Alcohol use: No    Alcohol/week: 0.6 oz    Types: 1 Cans of beer per week    Comment: rare  . Drug use: No     Allergies   Patient has no known allergies.   Review of Systems Review of Systems  Constitutional: Negative.   HENT: Negative.   Respiratory: Negative.   Cardiovascular: Positive for palpitations.  Gastrointestinal: Negative.   Musculoskeletal: Negative.   Skin: Negative.   Neurological: Negative.   Psychiatric/Behavioral: Negative.   All other systems reviewed and are  negative.    Physical Exam Updated Vital Signs BP (!) 124/95   Pulse (!) 132   Temp 98.5 F (36.9 C) (Oral)   Resp 18   Ht 5\' 11"  (1.803 m)   Wt 104.3 kg (230 lb)   SpO2 95%   BMI 32.08 kg/m   Physical Exam  Constitutional: He appears well-developed and well-nourished.  HENT:  Head: Normocephalic and atraumatic.  Eyes: Pupils are equal, round, and reactive to light. Conjunctivae are normal.  Neck: Neck supple. No tracheal deviation present. No thyromegaly present.  Cardiovascular:  No murmur heard. Tachycardic irregularly irregular  Pulmonary/Chest: Effort normal and breath sounds normal.  Abdominal: Soft. Bowel sounds are normal. He exhibits no distension. There is no tenderness.  Obese  Musculoskeletal: Normal range of motion. He exhibits no edema or tenderness.  Neurological: He is  alert. Coordination normal.  Skin: Skin is warm and dry. No rash noted.  Psychiatric: He has a normal mood and affect.  Nursing note and vitals reviewed.    ED Treatments / Results  Labs (all labs ordered are listed, but only abnormal results are displayed) Labs Reviewed - No data to display  EKG EKG Interpretation  Date/Time:  Sunday October 12 2017 09:41:09 EDT Ventricular Rate:  120 PR Interval:    QRS Duration: 104 QT Interval:  331 QTC Calculation: 468 R Axis:   165 Text Interpretation:  Atrial fibrillation Right axis deviation Abnormal R-wave progression, late transition SINCE LAST TRACING HEART RATE HAS INCREASED Confirmed by Orlie Dakin [id number]) on 10/12/2017 9:50:34 AM  ED ECG REPORT   Date: 10/12/2017  1028 am post cardioversion  Rate: 80  Rhythm: normal sinus rhythm  QRS Axis: right  Intervals: normal  ST/T Wave abnormalities: nonspecific T wave changes  Conduction Disutrbances:left posterior fascicular block  Narrative Interpretation:   Old EKG Reviewed: Rhythm from earlier today atrial fibrillation with RVR  I have personally reviewed the EKG tracing and  agree with the computerized printout as noted.  No conduction disturbance Radiology Dg Chest 2 View  Result Date: 10/11/2017 CLINICAL DATA:  67 year old male with a history of tachycardia. Fiber fighter. History of atrial fibrillation EXAM: CHEST - 2 VIEW COMPARISON:  10/20/2016, 10/28/2014 FINDINGS: Cardiomediastinal silhouette unchanged in size and contour. No evidence of pneumothorax or pleural effusion. No confluent airspace disease. Similar appearance of chronic interstitial opacities, particularly at the lung bases. Degenerative changes of the spine. IMPRESSION: Chronic lung changes without evidence of superimposed acute cardiopulmonary disease Electronically Signed   By: Corrie Mckusick D.O.   On: 10/11/2017 10:19    Procedures .Sedation Date/Time: 10/12/2017 10:20 AM Performed by: Orlie Dakin, MD Authorized by: Orlie Dakin, MD   Consent:    Consent obtained:  Verbal and written   Consent given by:  Patient   Risks discussed:  Allergic reaction, dysrhythmia, inadequate sedation and prolonged hypoxia resulting in organ damage   Alternatives discussed:  Analgesia without sedation Universal protocol:    Procedure explained and questions answered to patient or proxy's satisfaction: yes     Relevant documents present and verified: yes     Test results available and properly labeled: yes     Imaging studies available: yes     Required blood products, implants, devices, and special equipment available: yes     Site/side marked: no     Immediately prior to procedure a time out was called: yes     Patient identity confirmation method:  Arm band and hospital-assigned identification number Indications:    Procedure performed:  Cardioversion   Intended level of sedation:  Deep Pre-sedation assessment:    Time since last food or drink:  3.5 hours   ASA classification: class 2 - patient with mild systemic disease     Neck mobility: normal     Mouth opening:  3 or more finger widths    Thyromental distance:  3 finger widths   Mallampati score:  I - soft palate, uvula, fauces, pillars visible   Pre-sedation assessments completed and reviewed: airway patency, cardiovascular function, hydration status, mental status, nausea/vomiting and respiratory function     Pre-sedation assessment completed:  10/12/2017 10:20 AM Immediate pre-procedure details:    Reassessment: Patient reassessed immediately prior to procedure     Reviewed: vital signs, relevant labs/tests and NPO status     Verified: bag valve mask available, emergency  equipment available, intubation equipment available, IV patency confirmed, oxygen available and suction available   Procedure details (see MAR for exact dosages):    Preoxygenation:  Nasal cannula   Sedation:  Propofol   Analgesia:  None   Intra-procedure monitoring:  Blood pressure monitoring, continuous capnometry, frequent LOC assessments, frequent vital sign checks, continuous pulse oximetry and cardiac monitor   Intra-procedure events: none     Total Provider sedation time (minutes):  9 Post-procedure details:    Post-sedation assessment completed:  10/12/2017 10:29 AM   Attendance: Constant attendance by certified staff until patient recovered     Recovery: Patient returned to pre-procedure baseline     Post-sedation assessments completed and reviewed: airway patency, cardiovascular function, hydration status, mental status, nausea/vomiting, pain level, respiratory function and temperature     Patient is stable for discharge or admission: yes     Patient tolerance:  Tolerated well, no immediate complications .Cardioversion Date/Time: 10/12/2017 10:38 AM Performed by: Orlie Dakin, MD Authorized by: Orlie Dakin, MD   Consent:    Consent obtained:  Written   Consent given by:  Patient   Risks discussed:  Death, induced arrhythmia and pain   Alternatives discussed:  Rate-control medication and alternative treatment Pre-procedure details:     Cardioversion basis:  Emergent   Rhythm:  Atrial fibrillation   Electrode placement:  Anterior-posterior Patient sedated: Yes. Refer to sedation procedure documentation for details of sedation.  Attempt one:    Cardioversion mode:  Synchronous   Waveform:  Biphasic   Shock (Joules):  200   Shock outcome:  Conversion to normal sinus rhythm Post-procedure details:    Patient status:  Awake   Patient tolerance of procedure:  Tolerated well, no immediate complications   (including critical care time)  Medications Ordered in ED Medications - No data to display   Initial Impression / Assessment and Plan / ED Course  I have reviewed the triage vital signs and the nursing notes.  Pertinent labs & imaging results that were available during my care of the patient were reviewed by me and considered in my medical decision making (see chart for details).    Lab work and chest x-ray from yesterday reviewed Spoke with Dr. Marigene Ehlers suggest cardioversion .  Mclean also suggest increase diltiazem to 360 mg daily.  Follow-up at atrial fibrillation clinic CHA2DS2-VASc score=2.   11:20 a.m. patient asymptomatic.  Not lightheaded on standing.  Plan increase Cardizem to 360 mg daily.  I will write prescription for 120 mg.  Follow-up at atrial fibrillation clinic Final Clinical Impressions(s) / ED Diagnoses  Diagnosis atrial fibrillation with rapid ventricular response Final diagnoses:  None    ED Discharge Orders    None       Orlie Dakin, MD 10/12/17 1126

## 2017-10-12 NOTE — ED Notes (Signed)
Assumed care of patient from [place], [place]. Pt resting quietly. No distress. No complaints. In NSR via bedside cardiac monitor.

## 2017-10-12 NOTE — ED Triage Notes (Addendum)
Presents with heart rate of 130-140, he was here yesterday for the same. HE has a hx of Afib and takes flecanide and cardizem. He denies nausea, dizziness and SOB. Denies chest pain. He endorses right leg pain below the knee on medial side of lower leg.

## 2017-10-16 ENCOUNTER — Encounter (HOSPITAL_COMMUNITY): Payer: Self-pay | Admitting: Nurse Practitioner

## 2017-10-16 ENCOUNTER — Ambulatory Visit (HOSPITAL_COMMUNITY)
Admission: RE | Admit: 2017-10-16 | Discharge: 2017-10-16 | Disposition: A | Discharge status: Home / Self Care | Payer: Medicare Other | Source: Ambulatory Visit | Attending: Nurse Practitioner | Admitting: Nurse Practitioner

## 2017-10-16 VITALS — BP 132/70 | HR 50 | Ht 71.0 in | Wt 236.0 lb

## 2017-10-16 DIAGNOSIS — Z7901 Long term (current) use of anticoagulants: Secondary | ICD-10-CM | POA: Insufficient documentation

## 2017-10-16 DIAGNOSIS — I471 Supraventricular tachycardia: Secondary | ICD-10-CM | POA: Diagnosis not present

## 2017-10-16 DIAGNOSIS — Z8614 Personal history of Methicillin resistant Staphylococcus aureus infection: Secondary | ICD-10-CM | POA: Insufficient documentation

## 2017-10-16 DIAGNOSIS — E669 Obesity, unspecified: Secondary | ICD-10-CM | POA: Diagnosis not present

## 2017-10-16 DIAGNOSIS — Z6830 Body mass index (BMI) 30.0-30.9, adult: Secondary | ICD-10-CM | POA: Insufficient documentation

## 2017-10-16 DIAGNOSIS — G629 Polyneuropathy, unspecified: Secondary | ICD-10-CM | POA: Diagnosis not present

## 2017-10-16 DIAGNOSIS — R001 Bradycardia, unspecified: Secondary | ICD-10-CM | POA: Diagnosis not present

## 2017-10-16 DIAGNOSIS — K219 Gastro-esophageal reflux disease without esophagitis: Secondary | ICD-10-CM | POA: Diagnosis not present

## 2017-10-16 DIAGNOSIS — I480 Paroxysmal atrial fibrillation: Secondary | ICD-10-CM

## 2017-10-16 DIAGNOSIS — G4733 Obstructive sleep apnea (adult) (pediatric): Secondary | ICD-10-CM | POA: Insufficient documentation

## 2017-10-16 DIAGNOSIS — Z9889 Other specified postprocedural states: Secondary | ICD-10-CM | POA: Insufficient documentation

## 2017-10-16 DIAGNOSIS — Z79899 Other long term (current) drug therapy: Secondary | ICD-10-CM | POA: Insufficient documentation

## 2017-10-16 DIAGNOSIS — I4891 Unspecified atrial fibrillation: Secondary | ICD-10-CM | POA: Diagnosis present

## 2017-10-16 DIAGNOSIS — I10 Essential (primary) hypertension: Secondary | ICD-10-CM | POA: Diagnosis not present
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
## 2017-10-21 ENCOUNTER — Other Ambulatory Visit (HOSPITAL_COMMUNITY): Payer: Self-pay | Admitting: Nurse Practitioner

## 2017-10-21 DIAGNOSIS — I480 Paroxysmal atrial fibrillation: Secondary | ICD-10-CM

## 2017-11-05 ENCOUNTER — Encounter: Payer: Self-pay | Admitting: Cardiology

## 2017-11-05 ENCOUNTER — Ambulatory Visit (INDEPENDENT_AMBULATORY_CARE_PROVIDER_SITE_OTHER): Payer: Medicare Other | Admitting: Cardiology

## 2017-11-05 VITALS — BP 142/80 | HR 59 | Ht 71.0 in | Wt 234.5 lb

## 2017-11-05 DIAGNOSIS — G4733 Obstructive sleep apnea (adult) (pediatric): Secondary | ICD-10-CM

## 2017-11-05 DIAGNOSIS — I480 Paroxysmal atrial fibrillation: Secondary | ICD-10-CM

## 2017-11-05 DIAGNOSIS — E669 Obesity, unspecified: Secondary | ICD-10-CM | POA: Diagnosis not present

## 2017-11-05 DIAGNOSIS — I10 Essential (primary) hypertension: Secondary | ICD-10-CM | POA: Diagnosis not present
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
## 2017-11-05 NOTE — Patient Instructions (Signed)
Medication Instructions:   Your physician recommends that you continue on your current medications as directed. Please refer to the Current Medication list given to you today.   If you need a refill on your cardiac medications before your next appointment, please call your pharmacy.  Labwork:  NONE ORDERED  TODAY    Testing/Procedures:  NONE ORDERED  TODAY    Follow-Up: WITH DR TURNER FOR SLEEP  APNEA   IN 3 TO 4 MONTHS    Any Other Special Instructions Will Be Listed Below (If Applicable).

## 2017-11-12 ENCOUNTER — Other Ambulatory Visit (HOSPITAL_COMMUNITY): Payer: Self-pay | Admitting: *Deleted

## 2017-11-12 MED ORDER — DILTIAZEM HCL ER COATED BEADS 120 MG PO CP24: 120.0000 mg | ORAL_CAPSULE | Freq: Every day | ORAL | 1 refills | Status: DC

## 2017-12-05 ENCOUNTER — Encounter: Payer: Self-pay | Admitting: Neurology

## 2017-12-05 ENCOUNTER — Ambulatory Visit: Payer: Medicare Other | Admitting: Neurology

## 2017-12-05 VITALS — BP 130/78 | HR 55 | Ht 71.0 in | Wt 237.0 lb

## 2017-12-05 DIAGNOSIS — G609 Hereditary and idiopathic neuropathy, unspecified: Secondary | ICD-10-CM

## 2017-12-05 NOTE — Progress Notes (Signed)
Follow-up Visit   Date: 12/05/17    Carlos Thomas MRN: [id number] DOB: January 13, 1951   Interim History: Carlos Thomas is a 67 y.o. Caucasian male with GERD, OSA on CPAP, and atrial fibrillation returning to the clinic for follow-up of neuropathy.  The patient was accompanied to the clinic by self.  History of present illness: Starting in 2016, he began noticing numbness of the great toe, which gradually progressed to the based of the toes and a few months later developed the same symptoms over the right toe.  Symptoms are constant and not painful.  There is no tingling or burning.  He denies any imbalance, and walks independently without any falls.  He started taking L-arginine in the summer of 2017 at the recommendation of his chiropractor. Electrodiagnostic testing in September 2017 showed axonal sensorimotor polyneuropathy affecting the legs.    He has no personal history of diabetes.  He stopped drinking alcohol in 2010, when he was drinking on the 2-3 liquor drinks on the weekends.  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  No family history of neuropathy.    UPDATE 12/02/2016:  He is here for 6 month follow-up visit.  He feels that numbness is worse and affects more of the sole of the feet.  He denies any painful tingling or burning.  He has some imbalance, but walks unassisted and has not had any falls.  He is frustrated that his neuropathy cannot be "fixed".   UPDATE 12/05/2017:  He is here for 1 year follow-up for neuropathy.  Over the past year, he has noticed progressive numbness over the top and the bottom of the feet.  He denies any imbalance, falls, or weakness.  He has been recommended to try laser therapy at his chiropractor's office and would like my opinion on this. He tried it on his right index finger after suffering a superficial laceration, and reports having 90% improvement of his numbness.   Medications:    Current Outpatient Medications on File Prior to Visit  Medication Sig Dispense Refill  . diltiazem (CARDIZEM CD) 120 MG 24 hr capsule Take 1 capsule (120 mg total) by mouth daily. 90 capsule 1  . diltiazem (CARTIA XT) 240 MG 24 hr capsule TAKE 1 CAPSULE(240 MG) BY MOUTH DAILY 30 capsule 6  . flecainide (TAMBOCOR) 100 MG tablet TAKE 1 TABLET BY MOUTH TWICE DAILY 60 tablet 3  . omeprazole (PRILOSEC) 20 MG capsule Take 20 mg by mouth every morning.     . tamsulosin (FLOMAX) 0.4 MG CAPS capsule Take 0.4 mg by mouth daily.    Alveda Reasons 20 MG TABS tablet TAKE 1 TABLET BY MOUTH DAILY WITH SUPPER 30 tablet 3   No current facility-administered medications on file prior to visit.     Allergies: No Known Allergies  Review of Systems:  CONSTITUTIONAL: No fevers, chills, night sweats, or weight loss.  EYES: No visual changes or eye pain ENT: No hearing changes.  No history of nose bleeds.   RESPIRATORY: No cough, wheezing and shortness of breath.   CARDIOVASCULAR: Negative for chest pain, and palpitations.   GI: Negative for abdominal discomfort, blood in stools or black stools.  No recent change in bowel habits.   GU:  No history of incontinence.   MUSCLOSKELETAL: No history of joint pain or swelling.  No myalgias.   SKIN: Negative for lesions, rash, and itching.   ENDOCRINE: Negative for  cold or heat intolerance, polydipsia or goiter.   PSYCH:  No depression or anxiety symptoms.   NEURO: As Above.   Vital Signs:  BP 130/78   Pulse (!) 55   Ht _0  (1.803 m)   Wt 237 lb (107.5 kg)   SpO2 95%   BMI 33.05 kg/m    General Medical Exam:   General: Comfortable, difficult to engage in conversation  Eyes/ENT: see cranial nerve examination.   Neck: No masses appreciated.  Full range of motion without tenderness.  No carotid bruits. Respiratory:  Clear to auscultation, good air entry bilaterally.   Cardiac:  Regular rate and rhythm, no murmur.   Ext:  No edema  Neurological Exam: MENTAL  STATUS including orientation to time, place, person, recent and remote memory, attention span and concentration, language, and fund of knowledge is normal.  Speech is not dysarthric.  CRANIAL NERVES:  Pupils equal round and reactive to light.  Normal conjugate, extra-ocular eye movements in all directions of gaze.  No ptosis.  Face is symmetric.   MOTOR:  Motor strength is 5/5 in all extremities, include toe flexion and extension.    MSRs:  Reflexes are 2+/4 throughout, except absent Achilles bilaterally.  SENSORY: Vibration is absent at the great toe bilaterally and intact at the ankles. Proprioception, temperature and pin prick is intact throughout. There is mild sway with Rhomberg testing.  COORDINATION/GAIT:   Gait narrow based and stable.  He is unsteady with tandem gait.  Stressed gait intact.   Data: NCS/EMG of the legs 03/21/2016:  The electrophysiologic findings are most consistent with a distal and symmetric sensorimotor polyneuropathy, axon loss in type, affecting the lower extremities. Overall, these findings are mild to moderate in degree electrically.  Labs 04/04/2016:  HbA1c 5.8, ESR 4, TSH 1.26   Labs 06/03/2016:  Vitamin B1 15, heavy metal screen neg, ceruloplasmin 21, SPEP with IFE no M protein  IMPRESSION/PLAN: Predominately sensory peripheral neuropathy, idiopathic. EDX showed mild-moderate sensorimotor axonal neuropathy.  His exam is stable and continues to show distal sensory loss, with normal strength.  His serology testing has been nondiagnostic (see above).  Despite explaining the nature of idiopathic neuropathy, he remains frustrated that it cannot be "fixed".  With symptoms primarily numbness, there is no role for medications. Regarding laser treatment offered by chiropractic offices, these therapies are not evidenced-based and I do not recommend it, but he is welcome to explore options at his own accord.  He is interested in seeking a second opinion at an academic center.      Thank you for allowing me to participate in patient's care.  If I can answer any additional questions, I would be pleased to do so.    Sincerely,    Gaia Gullikson K. Posey Pronto, DO

## 2017-12-05 NOTE — Patient Instructions (Signed)
Referral to [facility] for second opinion of neuropathy

## 2017-12-23 ENCOUNTER — Other Ambulatory Visit (HOSPITAL_COMMUNITY): Payer: Self-pay | Admitting: Nurse Practitioner

## 2017-12-31 ENCOUNTER — Telehealth: Payer: Self-pay | Admitting: *Deleted

## 2017-12-31 NOTE — Telephone Encounter (Signed)
Patient notified that he has an appointment at [facility] with Dr. Daiva Huge on 03-30-18 at 9:00.  Instructed to arrive at 8:45.

## 2018-02-09 ENCOUNTER — Other Ambulatory Visit: Payer: Self-pay | Admitting: Cardiology

## 2018-02-09 ENCOUNTER — Ambulatory Visit: Payer: Medicare Other | Admitting: Cardiology

## 2018-02-09 ENCOUNTER — Encounter: Payer: Self-pay | Admitting: Cardiology

## 2018-02-09 VITALS — BP 148/62 | HR 56 | Ht 71.0 in | Wt 236.2 lb

## 2018-02-09 DIAGNOSIS — G4733 Obstructive sleep apnea (adult) (pediatric): Secondary | ICD-10-CM | POA: Diagnosis not present

## 2018-02-09 DIAGNOSIS — E669 Obesity, unspecified: Secondary | ICD-10-CM

## 2018-02-09 DIAGNOSIS — G5790 Unspecified mononeuropathy of unspecified lower limb: Secondary | ICD-10-CM

## 2018-02-09 DIAGNOSIS — I10 Essential (primary) hypertension: Secondary | ICD-10-CM | POA: Diagnosis not present

## 2018-02-09 NOTE — Patient Instructions (Addendum)
Medication Instructions:  Your physician recommends that you continue on your current medications as directed. Please refer to the Current Medication list given to you today.   Labwork: None ordered  Testing/Procedures: Your physician has requested that you have a lower extremity arterial duplex. This test is an ultrasound of the arteries in the legs. It looks at arterial blood flow in the legs. Allow one hour for Lower Arterial scans. There are no restrictions or special instructions.   Follow-Up: Your physician wants you to follow-up in: 1 year with Dr. Radford Pax. You will receive a reminder letter in the mail two months in advance. If you don't receive a letter, please call our office to schedule the follow-up appointment.   Any Other Special Instructions Will Be Listed Below (If Applicable).     If you need a refill on your cardiac medications before your next appointment, please call your pharmacy.

## 2018-02-09 NOTE — Progress Notes (Signed)
Cardiology Office Note:    Date:  02/09/2018   ID:  Carlos Thomas, DOB 02-17-1951, MRN [id number]  PCP:  Shirline Frees, MD  Cardiologist:  Fransico Him, MD    Referring MD: Shirline Frees, MD   Chief Complaint  Patient presents with  . Sleep Apnea  . Hypertension    History of Present Illness:    Carlos Thomas is a 67 y.o. male with a hx of OSA and obesity who presents today for followup.  He is doing well with his CPAP device and thinks that he has gotten used to it.  He tolerates the mask and feels the pressure is adequate.  Since going on CPAP he feels rested in the am and has no significant daytime sleepiness.  He denies any significant mouth or nasal dryness or nasal congestion.  He does not think that he snores.  Is also complaining that he has had some problems with peripheral neuropathy and saw a neurologist who felt that it might not be related to a neurologic problem and could be vascular in origin.   Past Medical History:  Diagnosis Date  . Anticoagulant long-term use    xarelto  . Balanitis   . GERD (gastroesophageal reflux disease)   . History of adenomatous polyp of colon    2006  tubular adenoma's and hyperplastic polyp's;   2011  tubular adenoma  . History of atrial flutter    s/p ablation atrial flutter/atrial fib in 2010  . History of MRSA infection    1990's  infected wound near surgerical area   . History of repair of hiatal hernia   . HTN (hypertension)   . Obesity (BMI 30-39.9)   . OSA on CPAP    Mild OSA  per study 05-07-2012-- w AHI 6.57/hr now on 13cm H2O  . PAF (paroxysmal atrial fibrillation) [facility]) primary cardiologist-  dr Marlou Porch   ep cardiologist-  dr Rayann Heman---  s/p  ablation afib and aflutter 2010  and  ablation afib/svt  2013  . Peripheral neuropathy, hereditary/idiopathic    bilateral feet---  neurologist- dr patel  . 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 S/P radiofrequency ablation operation for  arrhythmia    01-09-2009  cardiac ablation afib/flutter;   09-13-2011  ablation afib/svt and in ther right atrium  . Wears glasses     Past Surgical History:  Procedure Laterality Date  . ATRIAL FIBRILLATION ABLATION N/A 09/13/2011   Procedure: ATRIAL FIBRILLATION ABLATION;  Surgeon: Thompson Grayer, MD;  Location: [facility] CATH LAB;  Service: Cardiovascular;  Laterality: N/A;  . CARDIAC ELECTROPHYSIOLOGY MAPPING AND ABLATION  [id number]   dr allred   successful ablation atrial fibrillation /  atrial flutter  . CARDIAC ELECTROPHYSIOLOGY MAPPING AND ABLATION  09-13-2011   dr Rayann Heman   afib mapping/ ablation and  mapping/ablation in the right atrium  . CARDIOVASCULAR STRESS TEST  09/2006   per dr Marlou Porch note nuclear study normal w/ no ischemia  . CARDIOVERSION N/A 09/21/2015   Procedure: CARDIOVERSION;  Surgeon: Lelon Perla, MD;  Location: [facility] ENDOSCOPY;  Service: Cardiovascular;  Laterality: N/A;  . CIRCUMCISION N/A 06/27/2016   Procedure: CIRCUMCISION ADULT;  Surgeon: Carolan Clines, MD;  Location: [facility];  Service: Urology;  Laterality: N/A;  . COLONOSCOPY  last one 02/  2011  . DIRECT CURRENT CARDIOVERSION  02/02/2010   dr Leonia Reeves  . LAPAROSCOPIC CHOLECYSTECTOMY  1990's  . OPEN HIATAL HERNIA REPAIR  1970's  .  TEE WITHOUT CARDIOVERSION  09/12/2011   Procedure: TRANSESOPHAGEAL ECHOCARDIOGRAM (TEE);  Surgeon: Jettie Booze, MD;  Location: [facility] ENDOSCOPY;  Service: Cardiovascular;  Laterality: N/A;  trace AR, no LA/LAA thrombus, normal LVfunction, moderate descending aortic atherosclerosis  . TEE WITHOUT CARDIOVERSION N/A 09/21/2015   Procedure: TRANSESOPHAGEAL ECHOCARDIOGRAM (TEE);  Surgeon: Lelon Perla, MD;  Location: [facility] ENDOSCOPY;  Service: Cardiovascular;  Laterality: N/A; normal LV systolic funciton,  moderate LAE,  no LAA thrombus,  trace MR and TR  . TRANSTHORACIC ECHOCARDIOGRAM  06/03/2014   ef 55-60%/  trivial MR and TR    Current Medications: Current  Meds  Medication Sig  . diltiazem (CARDIZEM CD) 120 MG 24 hr capsule Take 1 capsule (120 mg total) by mouth daily.  Marland Kitchen diltiazem (CARTIA XT) 240 MG 24 hr capsule TAKE 1 CAPSULE(240 MG) BY MOUTH DAILY  . flecainide (TAMBOCOR) 100 MG tablet TAKE 1 TABLET BY MOUTH TWICE DAILY  . omeprazole (PRILOSEC) 20 MG capsule Take 20 mg by mouth every morning.   . tamsulosin (FLOMAX) 0.4 MG CAPS capsule Take 0.4 mg by mouth daily.  Alveda Reasons 20 MG TABS tablet TAKE 1 TABLET BY MOUTH DAILY WITH SUPPER     Allergies:   Patient has no known allergies.   Social History   Socioeconomic History  . Marital status: Divorced    Spouse name: Not on file  . Number of children: Not on file  . Years of education: Not on file  . Highest education level: Not on file  Occupational History  . Not on file  Social Needs  . Financial resource strain: Not on file  . Food insecurity:    Worry: Not on file    Inability: Not on file  . Transportation needs:    Medical: Not on file    Non-medical: Not on file  Tobacco Use  . Smoking status: Never Smoker  . Smokeless tobacco: Never Used  Substance and Sexual Activity  . Alcohol use: No    Alcohol/week: 1.0 standard drinks    Types: 1 Cans of beer per week    Comment: rare  . Drug use: No  . Sexual activity: Not on file  Lifestyle  . Physical activity:    Days per week: Not on file    Minutes per session: Not on file  . Stress: Not on file  Relationships  . Social connections:    Talks on phone: Not on file    Gets together: Not on file    Attends religious service: Not on file    Active member of club or organization: Not on file    Attends meetings of clubs or organizations: Not on file    Relationship status: Not on file  Other Topics Concern  . 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  Education: college.     Family History: The patient's  family history includes CAD in his father; Heart attack in his father. There is no history of Atrial fibrillation or Anesthesia problems.  ROS:   Please see the history of present illness.    ROS  All other systems reviewed and negative.   EKGs/Labs/Other Studies Reviewed:    The following studies were reviewed today: PAP download  EKG:  EKG is not ordered today.    Recent Labs: 10/11/2017: ALT 40; BUN 14; Creatinine, Ser 1.03; Hemoglobin 17.7; Platelets  154; Potassium 3.9; Sodium 139   Recent Lipid Panel No results found for: CHOL, TRIG, HDL, CHOLHDL, VLDL, LDLCALC, LDLDIRECT  Physical Exam:    VS:  BP (!) 148/62   Pulse (!) 56   Ht 5\' 11"  (1.803 m)   Wt 236 lb 3.2 oz (107.1 kg)   SpO2 96%   BMI 32.94 kg/m     Wt Readings from Last 3 Encounters:  02/09/18 236 lb 3.2 oz (107.1 kg)  12/05/17 237 lb (107.5 kg)  11/05/17 234 lb 8 oz (106.4 kg)     GEN:  Well nourished, well developed in no acute distress HEENT: Normal NECK: No JVD; No carotid bruits LYMPHATICS: No lymphadenopathy CARDIAC: RRR, no murmurs, rubs, gallops RESPIRATORY:  Clear to auscultation without rales, wheezing or rhonchi  ABDOMEN: Soft, non-tender, non-distended MUSCULOSKELETAL:  No edema; No deformity  SKIN: Warm and dry NEUROLOGIC:  Alert and oriented x 3 PSYCHIATRIC:  Normal affect   ASSESSMENT:    1. OSA (obstructive sleep apnea)   2. Essential hypertension   3. Obesity (BMI 30-39.9)   4. Neuropathy of lower extremity, unspecified laterality    PLAN:    In order of problems listed above:  1.  OSA - the patient is tolerating PAP therapy well without any problems. The PAP download was reviewed today and showed an AHI of 0.6/hr on 7 cm H2O with 100% compliance in using more than 4 hours nightly.  The patient has been using and benefiting from PAP use and will continue to benefit from therapy.   2.  Hypertension -BP is borderline controlled on exam today.  He will continue on Cartia XT 240 mg  daily.  3.  Obesity - I have encouraged him to get into a routine exercise program and cut back on carbs and portions.   4.  Peripheral neuropathy of unclear etiology.  Patient's neurologist felt that it could be vascular in origin.  We will get lower extremity arterial Dopplers to rule out PAD.   Medication Adjustments/Labs and Tests Ordered: Current medicines are reviewed at length with the patient today.  Concerns regarding medicines are outlined above.  No orders of the defined types were placed in this encounter.  No orders of the defined types were placed in this encounter.   Signed, Fransico Him, MD  02/09/2018 10:52 AM    [place]

## 2018-02-11 ENCOUNTER — Ambulatory Visit (HOSPITAL_COMMUNITY)
Admission: RE | Admit: 2018-02-11 | Discharge: 2018-02-11 | Disposition: A | Discharge status: Home / Self Care | Payer: Medicare Other | Source: Ambulatory Visit | Attending: Cardiology | Admitting: Cardiology

## 2018-02-11 ENCOUNTER — Telehealth: Payer: Self-pay

## 2018-02-11 DIAGNOSIS — G5790 Unspecified mononeuropathy of unspecified lower limb: Secondary | ICD-10-CM | POA: Diagnosis not present

## 2018-02-11 NOTE — Telephone Encounter (Signed)
-----   Message from Sueanne Margarita, MD sent at 02/11/2018  3:07 PM EDT ----- Evidence of PVD by ABIs of the LE

## 2018-02-11 NOTE — Telephone Encounter (Signed)
Notes recorded by Frederik Schmidt, RN on 02/11/2018 at 3:28 PM EDT Informed patient of results.

## 2018-02-19 ENCOUNTER — Other Ambulatory Visit (HOSPITAL_COMMUNITY): Payer: Self-pay | Admitting: Nurse Practitioner

## 2018-02-19 DIAGNOSIS — I480 Paroxysmal atrial fibrillation: Secondary | ICD-10-CM

## 2018-02-22 ENCOUNTER — Other Ambulatory Visit (HOSPITAL_COMMUNITY): Payer: Self-pay | Admitting: Nurse Practitioner

## 2018-02-26 ENCOUNTER — Telehealth: Payer: Self-pay | Admitting: Cardiology

## 2018-02-26 NOTE — Telephone Encounter (Signed)
The patient called inquiring about his ABIs of his LE done recently, which indicated PVD. I called him on 8/14 and gave him the results noted by Dr Radford Pax.  He is calling to find out if any further testing needs to be done.  Please advise, thank you.

## 2018-02-26 NOTE — Telephone Encounter (Signed)
Please refer to Dr. Gwenlyn Found or Dr. Fletcher Anon

## 2018-02-26 NOTE — Telephone Encounter (Signed)
New Message:    Pt is calling for confirm test results and what the next step should be

## 2018-02-27 ENCOUNTER — Other Ambulatory Visit: Payer: Self-pay

## 2018-02-27 NOTE — Telephone Encounter (Signed)
Spoke with patient and informed him that someone will be calling him to schedule an appt with one of our PVD specialists.  He verbalized understanding and thanked [place] for the call.

## 2018-03-31 ENCOUNTER — Ambulatory Visit: Payer: Medicare Other | Admitting: Cardiovascular Disease

## 2018-03-31 ENCOUNTER — Encounter: Payer: Self-pay | Admitting: Cardiovascular Disease

## 2018-03-31 VITALS — BP 134/70 | HR 56 | Ht 71.0 in | Wt 235.8 lb

## 2018-03-31 DIAGNOSIS — G609 Hereditary and idiopathic neuropathy, unspecified: Secondary | ICD-10-CM

## 2018-03-31 NOTE — Patient Instructions (Signed)
Medication Instructions:  Your physician recommends that you continue on your current medications as directed. Please refer to the Current Medication list given to you today.  Labwork: None ordered.  Testing/Procedures: None ordered.  Follow-Up: Your physician recommends that you schedule a follow-up appointment as needed.   Any Other Special Instructions Will Be Listed Below (If Applicable).     If you need a refill on your cardiac medications before your next appointment, please call your pharmacy.   

## 2018-03-31 NOTE — Progress Notes (Signed)
Cardiology Office Note   Date:  03/31/2018   ID:  Carlos Thomas, DOB 06-Mar-1951, MRN [id number]  PCP:  Shirline Frees, MD  Cardiologist:   Dr. Radford Pax  No chief complaint on file.     History of Present Illness: Carlos Thomas is a 67 y.o. male who was referred by Dr. Radford Pax for evaluation management of possible peripheral arterial disease.  The patient has known history of paroxysmal atrial fibrillation on long-term anticoagulation with Xarelto, obstructive sleep apnea on CPAP and neuropathy.  He has no history of diabetes or tobacco use. The patient has been dealing with numbness affecting his feet for many years.  He also noticed some swelling in both legs and discoloration.  He has no calf or thigh claudication.  He underwent lower extremity arterial Doppler which showed noncompressible vessels but with triphasic waveforms and normal toe pressure.  He has no lower extremity ulceration.    Past Medical History:  Diagnosis Date  . Anticoagulant long-term use    xarelto  . Balanitis   . GERD (gastroesophageal reflux disease)   . History of adenomatous polyp of colon    2006  tubular adenoma's and hyperplastic polyp's;   2011  tubular adenoma  . History of atrial flutter    s/p ablation atrial flutter/atrial fib in 2010  . History of MRSA infection    1990's  infected wound near surgerical area   . History of repair of hiatal hernia   . HTN (hypertension)   . Obesity (BMI 30-39.9)   . OSA on CPAP    Mild OSA  per study 05-07-2012-- w AHI 6.57/hr now on 13cm H2O  . PAF (paroxysmal atrial fibrillation) [facility]) primary cardiologist-  dr Marlou Porch   ep cardiologist-  dr Rayann Heman---  s/p  ablation afib and aflutter 2010  and  ablation afib/svt  2013  . Peripheral neuropathy, hereditary/idiopathic    bilateral feet---  neurologist- dr patel  . 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 S/P radiofrequency ablation operation for arrhythmia    01-09-2009   cardiac ablation afib/flutter;   09-13-2011  ablation afib/svt and in ther right atrium  . Wears glasses     Past Surgical History:  Procedure Laterality Date  . ATRIAL FIBRILLATION ABLATION N/A 09/13/2011   Procedure: ATRIAL FIBRILLATION ABLATION;  Surgeon: Thompson Grayer, MD;  Location: [facility] CATH LAB;  Service: Cardiovascular;  Laterality: N/A;  . CARDIAC ELECTROPHYSIOLOGY MAPPING AND ABLATION  [phone]   dr allred   successful ablation atrial fibrillation /  atrial flutter  . CARDIAC ELECTROPHYSIOLOGY MAPPING AND ABLATION  09-13-2011   dr Rayann Heman   afib mapping/ ablation and  mapping/ablation in the right atrium  . CARDIOVASCULAR STRESS TEST  09/2006   per dr Marlou Porch note nuclear study normal w/ no ischemia  . CARDIOVERSION N/A 09/21/2015   Procedure: CARDIOVERSION;  Surgeon: Lelon Perla, MD;  Location: [facility] ENDOSCOPY;  Service: Cardiovascular;  Laterality: N/A;  . CIRCUMCISION N/A 06/27/2016   Procedure: CIRCUMCISION ADULT;  Surgeon: Carolan Clines, MD;  Location: [facility];  Service: Urology;  Laterality: N/A;  . COLONOSCOPY  last one 02/  2011  . DIRECT CURRENT CARDIOVERSION  02/02/2010   dr Leonia Reeves  . LAPAROSCOPIC CHOLECYSTECTOMY  1990's  . OPEN HIATAL HERNIA REPAIR  1970's  . TEE WITHOUT CARDIOVERSION  09/12/2011   Procedure: TRANSESOPHAGEAL ECHOCARDIOGRAM (TEE);  Surgeon: Jettie Booze, MD;  Location: [place];  Service: Cardiovascular;  Laterality:  N/A;  trace AR, no LA/LAA thrombus, normal LVfunction, moderate descending aortic atherosclerosis  . TEE WITHOUT CARDIOVERSION N/A 09/21/2015   Procedure: TRANSESOPHAGEAL ECHOCARDIOGRAM (TEE);  Surgeon: Lelon Perla, MD;  Location: [facility] ENDOSCOPY;  Service: Cardiovascular;  Laterality: N/A; normal LV systolic funciton,  moderate LAE,  no LAA thrombus,  trace MR and TR  . TRANSTHORACIC ECHOCARDIOGRAM  06/03/2014   ef 55-60%/  trivial MR and TR     Current Outpatient Medications  Medication Sig Dispense  Refill  . diltiazem (CARDIZEM CD) 120 MG 24 hr capsule Take 1 capsule (120 mg total) by mouth daily. 90 capsule 1  . diltiazem (CARTIA XT) 240 MG 24 hr capsule TAKE 1 CAPSULE(240 MG) BY MOUTH DAILY 30 capsule 6  . flecainide (TAMBOCOR) 100 MG tablet TAKE 1 TABLET BY MOUTH TWICE DAILY 60 tablet 3  . omeprazole (PRILOSEC) 20 MG capsule Take 20 mg by mouth every morning.     . tamsulosin (FLOMAX) 0.4 MG CAPS capsule Take 0.4 mg by mouth daily.    Alveda Reasons 20 MG TABS tablet TAKE 1 TABLET BY MOUTH DAILY WITH SUPPER 30 tablet 2   No current facility-administered medications for this visit.     Allergies:   Patient has no known allergies.    Social History:  The patient  reports that he has never smoked. He has never used smokeless tobacco. He reports that he does not drink alcohol or use drugs.   Family History:  The patient's family history includes CAD in his father; Heart attack in his father.    ROS:  Please see the history of present illness.   Otherwise, review of systems are positive for none.   All other systems are reviewed and negative.    PHYSICAL EXAM: VS:  BP 134/70 (BP Location: Left Arm, Patient Position: Sitting, Cuff Size: Large)   Pulse (!) 56   Ht 5\' 11"  (1.803 m)   Wt 235 lb 12.8 oz (107 kg)   BMI 32.89 kg/m  , BMI Body mass index is 32.89 kg/m. GEN: Well nourished, well developed, in no acute distress  HEENT: normal  Neck: no JVD, carotid bruits, or masses Cardiac: RRR; no murmurs, rubs, or gallops,no edema  Respiratory:  clear to auscultation bilaterally, normal work of breathing GI: soft, nontender, nondistended, + BS MS: no deformity or atrophy  Skin: warm and dry, no rash Neuro:  Strength and sensation are intact Psych: euthymic mood, full affect Vascular: Femoral pulses normal bilaterally.  Distal pulses are strong and normal on both sides including dorsalis pedis and posterior tibial.   EKG:  EKG is not ordered today.    Recent Labs: 10/11/2017:  ALT 40; BUN 14; Creatinine, Ser 1.03; Hemoglobin 17.7; Platelets 154; Potassium 3.9; Sodium 139    Lipid Panel No results found for: CHOL, TRIG, HDL, CHOLHDL, VLDL, LDLCALC, LDLDIRECT    Wt Readings from Last 3 Encounters:  03/31/18 235 lb 12.8 oz (107 kg)  02/09/18 236 lb 3.2 oz (107.1 kg)  12/05/17 237 lb (107.5 kg)       No flowsheet data found.    ASSESSMENT AND PLAN:  1.  Peripheral neuropathy: The patient's symptoms are not due to peripheral arterial disease.  He has no claudication and by physical exam his pulses are completely normal.  I reviewed his recent lower extremity arterial Doppler.  Although his vessels were noncompressible, his toe pressure was normal and waveforms were triphasic which indicates normal flow. The patient continues to follow-up with  neurology concerning neuropathy.  Consider treatment with gabapentin or Lyrica.  2.  Mild chronic venous insufficiency: Some of the discoloration of his legs is likely due to stasis dermatitis.  I advised him to elevate his legs during the day and consider using knee-high support stockings.    Disposition:   FU with me as needed.   Signed,  Kathlyn Sacramento, MD  03/31/2018 11:12 AM    [place]

## 2018-05-08 ENCOUNTER — Other Ambulatory Visit (HOSPITAL_COMMUNITY): Payer: Self-pay | Admitting: Nurse Practitioner

## 2018-05-14 ENCOUNTER — Other Ambulatory Visit (HOSPITAL_COMMUNITY): Payer: Self-pay | Admitting: *Deleted

## 2018-05-14 MED ORDER — DILTIAZEM HCL ER COATED BEADS 240 MG PO CP24: 240.0000 mg | ORAL_CAPSULE | Freq: Every day | ORAL | 2 refills | Status: DC

## 2018-05-20 ENCOUNTER — Other Ambulatory Visit (HOSPITAL_COMMUNITY): Payer: Self-pay | Admitting: Nurse Practitioner

## 2018-05-20 DIAGNOSIS — I480 Paroxysmal atrial fibrillation: Secondary | ICD-10-CM

## 2018-05-24 ENCOUNTER — Emergency Department (HOSPITAL_BASED_OUTPATIENT_CLINIC_OR_DEPARTMENT_OTHER)
Admission: EM | Admit: 2018-05-24 | Discharge: 2018-05-24 | Disposition: A | Discharge status: Home / Self Care | Payer: Medicare Other | Attending: Emergency Medicine | Admitting: Emergency Medicine

## 2018-05-24 ENCOUNTER — Encounter (HOSPITAL_BASED_OUTPATIENT_CLINIC_OR_DEPARTMENT_OTHER): Payer: Self-pay | Admitting: Adult Health

## 2018-05-24 ENCOUNTER — Other Ambulatory Visit: Payer: Self-pay

## 2018-05-24 ENCOUNTER — Emergency Department (HOSPITAL_BASED_OUTPATIENT_CLINIC_OR_DEPARTMENT_OTHER): Payer: Medicare Other

## 2018-05-24 DIAGNOSIS — Z7901 Long term (current) use of anticoagulants: Secondary | ICD-10-CM | POA: Diagnosis not present

## 2018-05-24 DIAGNOSIS — Z79899 Other long term (current) drug therapy: Secondary | ICD-10-CM | POA: Diagnosis not present

## 2018-05-24 DIAGNOSIS — I10 Essential (primary) hypertension: Secondary | ICD-10-CM | POA: Insufficient documentation

## 2018-05-24 DIAGNOSIS — Z9049 Acquired absence of other specified parts of digestive tract: Secondary | ICD-10-CM | POA: Diagnosis not present

## 2018-05-24 DIAGNOSIS — Y9389 Activity, other specified: Secondary | ICD-10-CM | POA: Diagnosis not present

## 2018-05-24 DIAGNOSIS — W260XXA Contact with knife, initial encounter: Secondary | ICD-10-CM | POA: Diagnosis not present

## 2018-05-24 DIAGNOSIS — S61412A Laceration without foreign body of left hand, initial encounter: Secondary | ICD-10-CM

## 2018-05-24 DIAGNOSIS — Y998 Other external cause status: Secondary | ICD-10-CM | POA: Diagnosis not present

## 2018-05-24 DIAGNOSIS — Y929 Unspecified place or not applicable: Secondary | ICD-10-CM | POA: Insufficient documentation

## 2018-05-24 DIAGNOSIS — S61211A Laceration without foreign body of left index finger without damage to nail, initial encounter: Secondary | ICD-10-CM | POA: Insufficient documentation

## 2018-05-24 MED ORDER — BACITRACIN ZINC 500 UNIT/GM EX OINT: TOPICAL_OINTMENT | Freq: Once | CUTANEOUS | Status: DC

## 2018-05-24 MED ORDER — LIDOCAINE HCL 1 % IJ SOLN
INTRAMUSCULAR | Status: AC
  Filled 2018-05-24: qty 20

## 2018-05-24 MED ORDER — HYDROCODONE-ACETAMINOPHEN 5-325 MG PO TABS
1.0000 | ORAL_TABLET | Freq: Once | ORAL | Status: AC
  Administered 2018-05-24: 1 via ORAL

## 2018-05-24 MED ORDER — HYDROCODONE-ACETAMINOPHEN 5-325 MG PO TABS
ORAL_TABLET | ORAL | Status: AC
  Filled 2018-05-24: qty 1

## 2018-05-24 MED ORDER — CEPHALEXIN 500 MG PO CAPS: 500.0000 mg | ORAL_CAPSULE | Freq: Three times a day (TID) | ORAL | 0 refills | Status: AC

## 2018-05-24 NOTE — ED Provider Notes (Signed)
[facility] EMERGENCY DEPARTMENT Provider Note   CSN: [id number] Arrival date & time: 05/24/18  1234     History   Chief Complaint Chief Complaint  Patient presents with  . Laceration    HPI Carlos Thomas is a 67 y.o. male resents for evaluation of left hand laceration that occurred just prior to ED arrival.  Patient reports that he was using ice and using a sharpener when he accidentally pushed to 4 far, causing his hand to hit the knife.  He states that he sustained a laceration to the dorsal aspect of his left index finger and dorsal aspect of his hand.  He is currently on Xarelto.  He states his last tetanus was in 2017.  He has not had any difficulty moving hand but states that the bleeding has not stopped.  Patient denies any numbness.  The history is provided by the patient.    Past Medical History:  Diagnosis Date  . Anticoagulant long-term use    xarelto  . Balanitis   . GERD (gastroesophageal reflux disease)   . History of adenomatous polyp of colon    2006  tubular adenoma's and hyperplastic polyp's;   2011  tubular adenoma  . History of atrial flutter    s/p ablation atrial flutter/atrial fib in 2010  . History of MRSA infection    1990's  infected wound near surgerical area   . History of repair of hiatal hernia   . HTN (hypertension)   . Obesity (BMI 30-39.9)   . OSA on CPAP    Mild OSA  per study 05-07-2012-- w AHI 6.57/hr now on 13cm H2O  . PAF (paroxysmal atrial fibrillation) [facility]) primary cardiologist-  dr Marlou Porch   ep cardiologist-  dr Rayann Heman---  s/p  ablation afib and aflutter 2010  and  ablation afib/svt  2013  . Peripheral neuropathy, hereditary/idiopathic    bilateral feet---  neurologist- dr patel  . 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 S/P radiofrequency ablation operation for arrhythmia    01-09-2009  cardiac ablation afib/flutter;   09-13-2011  ablation afib/svt and in ther right atrium  . Wears  glasses     Patient Active Problem List   Diagnosis Date Noted  . Hereditary and idiopathic peripheral neuropathy 06/03/2016  . URI (upper respiratory infection) 05/27/2014  . Obesity (BMI 30-39.9)   . OSA (obstructive sleep apnea) 06/11/2013  . Essential hypertension 02/07/2009  . PAF (paroxysmal atrial fibrillation) ([place]) 02/07/2009  . Atrial flutter ([place]) 02/07/2009    Past Surgical History:  Procedure Laterality Date  . ATRIAL FIBRILLATION ABLATION N/A 09/13/2011   Procedure: ATRIAL FIBRILLATION ABLATION;  Surgeon: Thompson Grayer, MD;  Location: [facility] CATH LAB;  Service: Cardiovascular;  Laterality: N/A;  . CARDIAC ELECTROPHYSIOLOGY MAPPING AND ABLATION  [phone]   dr allred   successful ablation atrial fibrillation /  atrial flutter  . CARDIAC ELECTROPHYSIOLOGY MAPPING AND ABLATION  09-13-2011   dr Rayann Heman   afib mapping/ ablation and  mapping/ablation in the right atrium  . CARDIOVASCULAR STRESS TEST  09/2006   per dr Marlou Porch note nuclear study normal w/ no ischemia  . CARDIOVERSION N/A 09/21/2015   Procedure: CARDIOVERSION;  Surgeon: Lelon Perla, MD;  Location: [facility] ENDOSCOPY;  Service: Cardiovascular;  Laterality: N/A;  . CIRCUMCISION N/A 06/27/2016   Procedure: CIRCUMCISION ADULT;  Surgeon: Carolan Clines, MD;  Location: [facility];  Service: Urology;  Laterality: N/A;  . COLONOSCOPY  last one  02/  2011  . DIRECT CURRENT CARDIOVERSION  02/02/2010   dr Leonia Reeves  . LAPAROSCOPIC CHOLECYSTECTOMY  1990's  . OPEN HIATAL HERNIA REPAIR  1970's  . TEE WITHOUT CARDIOVERSION  09/12/2011   Procedure: TRANSESOPHAGEAL ECHOCARDIOGRAM (TEE);  Surgeon: Jettie Booze, MD;  Location: [facility] ENDOSCOPY;  Service: Cardiovascular;  Laterality: N/A;  trace AR, no LA/LAA thrombus, normal LVfunction, moderate descending aortic atherosclerosis  . TEE WITHOUT CARDIOVERSION N/A 09/21/2015   Procedure: TRANSESOPHAGEAL ECHOCARDIOGRAM (TEE);  Surgeon: Lelon Perla, MD;  Location: [facility]  ENDOSCOPY;  Service: Cardiovascular;  Laterality: N/A; normal LV systolic funciton,  moderate LAE,  no LAA thrombus,  trace MR and TR  . TRANSTHORACIC ECHOCARDIOGRAM  06/03/2014   ef 55-60%/  trivial MR and TR        Home Medications    Prior to Admission medications   Medication Sig Start Date End Date Taking? Authorizing Provider  cephALEXin (KEFLEX) 500 MG capsule Take 1 capsule (500 mg total) by mouth 3 (three) times daily for 7 days. 05/24/18 05/31/18  Volanda Napoleon, PA-C  diltiazem (CARTIA XT) 240 MG 24 hr capsule Take 1 capsule (240 mg total) by mouth daily. 05/14/18   Sherran Needs, NP  flecainide (TAMBOCOR) 100 MG tablet TAKE 1 TABLET BY MOUTH TWICE DAILY 12/23/17   Sherran Needs, NP  omeprazole (PRILOSEC) 20 MG capsule Take 20 mg by mouth every morning.     [provider]  tamsulosin (FLOMAX) 0.4 MG CAPS capsule Take 0.4 mg by mouth daily.    [provider]  XARELTO 20 MG TABS tablet TAKE 1 TABLET BY MOUTH DAILY WITH SUPPER 05/20/18   Sherran Needs, NP    Family History Family History  Problem Relation Age of Onset  . CAD Father   . Heart attack Father   . Atrial fibrillation Neg Hx   . Anesthesia problems Neg Hx     Social History Social History   Tobacco Use  . Smoking status: Never Smoker  . Smokeless tobacco: Never Used  Substance Use Topics  . Alcohol use: No    Alcohol/week: 1.0 standard drinks    Types: 1 Cans of beer per week    Comment: rare  . Drug use: No     Allergies   Patient has no known allergies.   Review of Systems Review of Systems  Skin: Positive for wound.  Neurological: Negative for weakness and numbness.  All other systems reviewed and are negative.    Physical Exam Updated Vital Signs BP (!) 125/56 (BP Location: Left Arm)   Pulse (!) 48   Temp 98.2 F (36.8 C) (Oral)   Resp 18   Ht 5\' 11"  (1.803 m)   Wt 106.6 kg   SpO2 99%   BMI 32.78 kg/m   Physical Exam  Constitutional: He appears  well-developed and well-nourished.  HENT:  Head: Normocephalic and atraumatic.  Eyes: Conjunctivae and EOM are normal. Right eye exhibits no discharge. Left eye exhibits no discharge. No scleral icterus.  Cardiovascular:  Pulses:      Radial pulses are 2+ on the right side, and 2+ on the left side.  Pulmonary/Chest: Effort normal.  Musculoskeletal:  Tenderness palpation noted to the left index finger.  No deformity or crepitus noted.  Flexion/extension of left finger intact without any difficulty.  Lection/extension of both PIP and DIP intact when held in isolation.  Neurological: He is alert.  Skin: Skin is warm and dry. Capillary refill takes less  than 2 seconds.  Good distal cap refill.  LUE is not dusky in appearance or cool to touch.  There is about a 2 cm linear laceration noted to the dorsal aspect of the left index finger just distal to the MCP.  It does not cross over the MCP.  He has a small 0.5 cm laceration dorsal aspect of the hand.  Psychiatric: He has a normal mood and affect. His speech is normal and behavior is normal.  Nursing note and vitals reviewed.    ED Treatments / Results  Labs (all labs ordered are listed, but only abnormal results are displayed) Labs Reviewed - No data to display  EKG None  Radiology Dg Hand Complete Left  Result Date: 05/24/2018 CLINICAL DATA:  Cut hand with knife 2 hours ago. EXAM: LEFT HAND - COMPLETE 3+ VIEW COMPARISON:  None. FINDINGS: Punctate metallic density foreign body in the radial aspect of the ring finger the level of the middle phalanx. Notable advanced osteoarthritis at the first MCP joint. IMPRESSION: 1. Punctate foreign body in the ring finger soft tissues. 2. No acute osseous finding. Electronically Signed   By: Monte Fantasia M.D.   On: 05/24/2018 14:42    Procedures .Marland KitchenLaceration Repair Date/Time: 05/24/2018 3:06 PM Performed by: Volanda Napoleon, PA-C Authorized by: Volanda Napoleon, PA-C   Consent:    Consent  obtained:  Verbal   Consent given by:  Patient   Risks discussed:  Infection, need for additional repair, pain, poor cosmetic result and poor wound healing   Alternatives discussed:  No treatment and delayed treatment Universal protocol:    Procedure explained and questions answered to patient or proxy's satisfaction: yes     Relevant documents present and verified: yes     Test results available and properly labeled: yes     Imaging studies available: yes     Required blood products, implants, devices, and special equipment available: yes     Site/side marked: yes     Immediately prior to procedure, a time out was called: yes     Patient identity confirmed:  Verbally with patient Anesthesia (see MAR for exact dosages):    Anesthesia method:  Local infiltration   Local anesthetic:  Lidocaine 1% w/o epi Laceration details:    Location:  Finger   Finger location:  L index finger   Length (cm):  2 Repair type:    Repair type:  Simple Pre-procedure details:    Preparation:  Patient was prepped and draped in usual sterile fashion Exploration:    Wound exploration: wound explored through full range of motion     Wound extent: no foreign bodies/material noted and no tendon damage noted   Treatment:    Area cleansed with:  Betadine   Amount of cleaning:  Extensive   Irrigation solution:  Sterile saline   Irrigation method:  Syringe   Visualized foreign bodies/material removed: no   Skin repair:    Repair method:  Sutures   Suture size:  5-0   Suture material:  Nylon   Suture technique:  Simple interrupted   Number of sutures:  6 Approximation:    Approximation:  Close   (including critical care time)  Medications Ordered in ED Medications  HYDROcodone-acetaminophen (NORCO/VICODIN) 5-325 MG per tablet 1 tablet (1 tablet Oral Given 05/24/18 1526)     Initial Impression / Assessment and Plan / ED Course  I have reviewed the triage vital signs and the nursing  notes.  Pertinent labs &  imaging results that were available during my care of the patient were reviewed by me and considered in my medical decision making (see chart for details).     67 year old male who presents for evaluation of right index finger laceration that occurred just prior to ED arrival.  He was sharpening knives and the knife slipped, causing a laceration noted to the dorsal aspect of his left index finger just proximal to the MCP.  He also has a small laceration on dorsal aspect of his hand.  Patient reports his tetanus is up-to-date. Patient is afebrile, non-toxic appearing, sitting comfortably on examination table. Vital signs reviewed and stable.  Patient is neurovascularly intact.  On exam, he has a 2 similar linear laceration noted to dorsal aspect of left index finger just distal to the MCP.  No evidence of foreign body, tendon.  He has full flexion/extension without any difficulty.  We will plan for x-ray for evaluation of any bony abnormality.  We will plan to provide wound care, repair.  X-ray reviewed.  Negative for any acute bony abnormality.  Laceration repaired as documented above.  Once wound was anesthetized, was thoroughly and extensively cleaned with sterile saline.  No evidence of foreign body, evidence of tendon injury.  Given that patient did this with a dirty knife, we will plan to put him on antibiotics for preventive wound care.  Instructed patient on wound care precautions. Patient had ample opportunity for questions and discussion. All patient's questions were answered with full understanding. Strict return precautions discussed. Patient expresses understanding and agreement to plan.   Final Clinical Impressions(s) / ED Diagnoses   Final diagnoses:  Laceration of left index finger without foreign body without damage to nail, initial encounter  Laceration of left hand without foreign body, initial encounter    ED Discharge Orders         Ordered     cephALEXin (KEFLEX) 500 MG capsule  3 times daily     05/24/18 1501           Desma Mcgregor 05/24/18 1913    Gareth Morgan, MD 05/31/18 1359

## 2018-05-24 NOTE — ED Notes (Signed)
ED Provider at bedside. 

## 2018-05-24 NOTE — Discharge Instructions (Signed)
Keep the wound clean and dry for the first 24 hours. After that you may gently clean the wound with soap and water. Make sure to pat dry the wound before covering it with any dressing. You can use topical antibiotic ointment and bandage. Ice and elevate for pain relief.  ° °You can take Tylenol or Ibuprofen as directed for pain. You can alternate Tylenol and Ibuprofen every 4 hours for additional pain relief.  ° °Take antibiotics as directed. Please take all of your antibiotics until finished. ° °Return to the Emergency Department, your primary care doctor, or the [facility] in 5-7 days for suture removal.  ° °Monitor closely for any signs of infection. Return to the Emergency Department for any worsening redness/swelling of the area that begins to spread, drainage from the site, worsening pain, fever or any other worsening or concerning symptoms.  ° ° °

## 2018-05-24 NOTE — ED Triage Notes (Signed)
Presents with 2 lacerations to dorsal surface of left hand from a kitchen knife. PT was sharpening a kitchen knife and missed the sharpener and got his hand. 2 lacerations noted, one apporx 1.5 inches at base of first finger and the 2nd  Smaller on the back of the hand. bleedning controlled, CMS intact.

## 2018-05-24 NOTE — ED Notes (Signed)
Pt became lightheaded and cold during suturing. gingerale and warm blanket given.

## 2018-06-04 ENCOUNTER — Ambulatory Visit (HOSPITAL_COMMUNITY)
Admission: RE | Admit: 2018-06-04 | Discharge: 2018-06-04 | Disposition: A | Discharge status: Home / Self Care | Payer: Medicare Other | Source: Ambulatory Visit | Attending: Nurse Practitioner | Admitting: Nurse Practitioner

## 2018-06-04 ENCOUNTER — Encounter (HOSPITAL_COMMUNITY): Payer: Self-pay | Admitting: Nurse Practitioner

## 2018-06-04 VITALS — BP 132/64 | HR 57 | Ht 71.0 in | Wt 235.0 lb

## 2018-06-04 DIAGNOSIS — I445 Left posterior fascicular block: Secondary | ICD-10-CM | POA: Insufficient documentation

## 2018-06-04 DIAGNOSIS — E669 Obesity, unspecified: Secondary | ICD-10-CM | POA: Insufficient documentation

## 2018-06-04 DIAGNOSIS — G629 Polyneuropathy, unspecified: Secondary | ICD-10-CM | POA: Insufficient documentation

## 2018-06-04 DIAGNOSIS — Z79899 Other long term (current) drug therapy: Secondary | ICD-10-CM | POA: Insufficient documentation

## 2018-06-04 DIAGNOSIS — I10 Essential (primary) hypertension: Secondary | ICD-10-CM | POA: Insufficient documentation

## 2018-06-04 DIAGNOSIS — G4733 Obstructive sleep apnea (adult) (pediatric): Secondary | ICD-10-CM | POA: Diagnosis not present

## 2018-06-04 DIAGNOSIS — Z8249 Family history of ischemic heart disease and other diseases of the circulatory system: Secondary | ICD-10-CM | POA: Diagnosis not present

## 2018-06-04 DIAGNOSIS — Z6832 Body mass index (BMI) 32.0-32.9, adult: Secondary | ICD-10-CM | POA: Diagnosis not present

## 2018-06-04 DIAGNOSIS — Z7901 Long term (current) use of anticoagulants: Secondary | ICD-10-CM | POA: Diagnosis not present

## 2018-06-04 DIAGNOSIS — Z9989 Dependence on other enabling machines and devices: Secondary | ICD-10-CM | POA: Insufficient documentation

## 2018-06-04 DIAGNOSIS — Z9889 Other specified postprocedural states: Secondary | ICD-10-CM | POA: Insufficient documentation

## 2018-06-04 DIAGNOSIS — R001 Bradycardia, unspecified: Secondary | ICD-10-CM | POA: Insufficient documentation

## 2018-06-04 DIAGNOSIS — K219 Gastro-esophageal reflux disease without esophagitis: Secondary | ICD-10-CM | POA: Insufficient documentation

## 2018-06-04 DIAGNOSIS — I480 Paroxysmal atrial fibrillation: Secondary | ICD-10-CM | POA: Diagnosis not present

## 2018-06-04 DIAGNOSIS — R000 Tachycardia, unspecified: Secondary | ICD-10-CM | POA: Diagnosis not present
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
## 2018-06-17 ENCOUNTER — Other Ambulatory Visit (HOSPITAL_COMMUNITY): Payer: Self-pay | Admitting: Nurse Practitioner

## 2018-07-20 ENCOUNTER — Other Ambulatory Visit (HOSPITAL_COMMUNITY): Payer: Self-pay | Admitting: Nurse Practitioner

## 2018-08-05 ENCOUNTER — Other Ambulatory Visit (HOSPITAL_COMMUNITY): Payer: Self-pay | Admitting: Nurse Practitioner

## 2018-09-18 ENCOUNTER — Other Ambulatory Visit (HOSPITAL_COMMUNITY): Payer: Self-pay | Admitting: Nurse Practitioner

## 2018-09-18 DIAGNOSIS — I480 Paroxysmal atrial fibrillation: Secondary | ICD-10-CM

## 2018-09-19 ENCOUNTER — Other Ambulatory Visit (HOSPITAL_COMMUNITY): Payer: Self-pay | Admitting: Nurse Practitioner

## 2018-09-19 DIAGNOSIS — I480 Paroxysmal atrial fibrillation: Secondary | ICD-10-CM

## 2018-11-01 ENCOUNTER — Other Ambulatory Visit (HOSPITAL_COMMUNITY): Payer: Self-pay | Admitting: Nurse Practitioner

## 2018-11-03 ENCOUNTER — Encounter: Payer: Self-pay | Admitting: Cardiology

## 2018-11-03 ENCOUNTER — Telehealth (INDEPENDENT_AMBULATORY_CARE_PROVIDER_SITE_OTHER): Payer: Medicare Other | Admitting: Cardiology

## 2018-11-03 ENCOUNTER — Other Ambulatory Visit: Payer: Self-pay

## 2018-11-03 VITALS — BP 161/78 | HR 58 | Ht 71.0 in | Wt 230.0 lb

## 2018-11-03 DIAGNOSIS — Z79899 Other long term (current) drug therapy: Secondary | ICD-10-CM

## 2018-11-03 DIAGNOSIS — I480 Paroxysmal atrial fibrillation: Secondary | ICD-10-CM

## 2018-11-03 DIAGNOSIS — I10 Essential (primary) hypertension: Secondary | ICD-10-CM

## 2018-11-03 NOTE — Patient Instructions (Signed)
Medication Instructions:  The current medical regimen is effective;  continue present plan and medications.  If you need a refill on your cardiac medications before your next appointment, please call your pharmacy.   Lab work: Please come in for blood work (BMP CBC) as discussed. If you have labs (blood work) drawn today and your tests are completely normal, you will receive your results only by: Marland Kitchen MyChart Message (if you have MyChart) OR . A paper copy in the mail If you have any lab test that is abnormal or we need to change your treatment, we will call you to review the results.  Follow-Up: Follow up in 6 months with Dr. Marlou Porch.  You will receive a letter in the mail 2 months before you are due.  Please call us when you receive this letter to schedule your follow up appointment.  Thank you for choosing [facility]!!

## 2018-11-03 NOTE — Progress Notes (Signed)
Virtual Visit via Video Note   This visit type was conducted due to national recommendations for restrictions regarding the COVID-19 Pandemic (e.g. social distancing) in an effort to limit this patient's exposure and mitigate transmission in our community.  Due to his co-morbid illnesses, this patient is at least at moderate risk for complications without adequate follow up.  This format is felt to be most appropriate for this patient at this time.  All issues noted in this document were discussed and addressed.  A limited physical exam was performed with this format.  Please refer to the patient's chart for his consent to telehealth for [facility].   Date:  11/03/2018   ID:  Carlos Thomas, DOB 09-17-1950, MRN [id number]  Patient Location: Home Provider Location: Home  PCP:  Carlos Frees, MD  Cardiologist:  Carlos Furbish, MD  Electrophysiologist:  None   Evaluation Performed:  Follow-Up Visit  Chief Complaint: Paroxysmal atrial fibrillation  History of Present Illness:    Carlos Thomas is a 68 y.o. male with paroxysmal atrial fibrillation status post ablations in both 2010 as well as 2013 with intermittent cardioversion on flecainide. -Back in December he felt his heart go into atrial fibrillation during stress and traffic.  He was rate controlled in the emergency room but then went back the second day to the ER with heart rates of 120 and was cardioverted.  He also has peripheral neuropathy.  Vascular studies were normal waveforms, triphasic.  This was reviewed by consultation, Carlos Thomas.  He also has some stasis dermatitis.  Our conversation today centered mostly around potentially switching his flecainide over to [facility].  He has seen a neurologist with [facility] who mentioned that the flecainide may be the cause of his peripheral neuropathy which seems to be getting worse moving up his foot/leg.  His obstructive sleep apnea is being treated by Dr. Radford Thomas  The patient  does not have symptoms concerning for COVID-19 infection (fever, chills, cough, or new shortness of breath).    Past Medical History:  Diagnosis Date   Anticoagulant long-term use    xarelto   Balanitis    GERD (gastroesophageal reflux disease)    History of adenomatous polyp of colon    2006  tubular adenoma's and hyperplastic polyp's;   2011  tubular adenoma   History of atrial flutter    s/p ablation atrial flutter/atrial fib in 2010   History of MRSA infection    1990's  infected wound near surgerical area    History of repair of hiatal hernia    HTN (hypertension)    Obesity (BMI 30-39.9)    OSA on CPAP    Mild OSA  per study 05-07-2012-- w AHI 6.57/hr now on 13cm H2O   PAF (paroxysmal atrial fibrillation) [facility]) primary cardiologist-  dr Carlos Thomas   ep cardiologist-  dr Carlos Thomas---  s/p  ablation afib and aflutter 2010  and  ablation afib/svt  2013   Peripheral neuropathy, hereditary/idiopathic    bilateral feet---  neurologist- dr Carlos Thomas   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 CATH LAB;  Service: Cardiovascular;  Laterality: N/A;   CARDIAC ELECTROPHYSIOLOGY MAPPING AND ABLATION  [phone]   dr Carlos Thomas   successful ablation atrial fibrillation /  atrial flutter   CARDIAC ELECTROPHYSIOLOGY MAPPING AND ABLATION  09-13-2011   dr Carlos Thomas   afib mapping/ ablation and  mapping/ablation in the right atrium   CARDIOVASCULAR STRESS TEST  09/2006   per dr Carlos Thomas note nuclear study normal w/ no ischemia   CARDIOVERSION N/A 09/21/2015   Procedure: CARDIOVERSION;  Surgeon: Carlos Perla, MD;   Location: [facility] ENDOSCOPY;  Service: Cardiovascular;  Laterality: N/A;   CIRCUMCISION N/A 06/27/2016   Procedure: CIRCUMCISION ADULT;  Surgeon: Carlos Clines, MD;  Location: [facility];  Service: Urology;  Laterality: N/A;   COLONOSCOPY  last one 02/  2011   DIRECT CURRENT CARDIOVERSION  02/02/2010   dr Carlos Thomas   LAPAROSCOPIC CHOLECYSTECTOMY  1990's   OPEN HIATAL HERNIA REPAIR  1970's   TEE WITHOUT CARDIOVERSION  09/12/2011   Procedure: TRANSESOPHAGEAL ECHOCARDIOGRAM (TEE);  Surgeon: Carlos Booze, MD;  Location: [facility] ENDOSCOPY;  Service: Cardiovascular;  Laterality: N/A;  trace AR, no LA/LAA thrombus, normal LVfunction, moderate descending aortic atherosclerosis   TEE WITHOUT CARDIOVERSION N/A 09/21/2015   Procedure: TRANSESOPHAGEAL ECHOCARDIOGRAM (TEE);  Surgeon: Carlos Perla, MD;  Location: [facility] ENDOSCOPY;  Service: Cardiovascular;  Laterality: N/A; normal LV systolic funciton,  moderate LAE,  no LAA thrombus,  trace MR and TR   TRANSTHORACIC ECHOCARDIOGRAM  06/03/2014   ef 55-60%/  trivial MR and TR     Current Meds  Medication Sig   CARTIA XT 120 MG 24 hr capsule TAKE ONE CAPSULE BY MOUTH DAILY   diltiazem (CARTIA XT) 240 MG 24 hr capsule Take 1 capsule (240 mg total) by mouth daily.   flecainide (TAMBOCOR) 100 MG tablet TAKE 1 TABLET BY MOUTH TWICE DAILY   omeprazole (PRILOSEC) 20 MG capsule Take 20 mg by mouth every morning.    tamsulosin (FLOMAX) 0.4 MG CAPS capsule Take 0.4 mg by mouth daily.   XARELTO 20 MG TABS tablet TAKE 1 TABLET BY MOUTH EVERY DAY WITH SUPPER     Allergies:   Patient has no known allergies.   Social History   Tobacco Use   Smoking status: Never Smoker   Smokeless tobacco: Never Used  Substance Use Topics   Alcohol use: No    Alcohol/week: 1.0 standard drinks    Types: 1 Cans of beer per week    Comment: rare   Drug use: No     Family Hx: The patient's family history includes CAD in his father; Heart  attack in his father. There is no history of Atrial fibrillation or Anesthesia problems.  ROS:   Please see the history of present illness.    Denies any fevers chills nausea vomiting syncope bleeding All other systems reviewed and are negative.   Prior CV studies:   The following studies were reviewed today:  Transesophageal echocardiogram 2017-normal EF  Labs/Other Tests and Data Reviewed:    EKG:  An ECG dated 06/04/18 was personally reviewed today and demonstrated:  Sinus bradycardia heart rate 57, possible left posterior fascicular block  Recent Labs: No results found for requested labs within last 8760 hours.   Recent Lipid Panel No results found for: CHOL, TRIG, HDL, CHOLHDL, LDLCALC, LDLDIRECT  Wt Readings from Last 3 Encounters:  11/03/18 230 lb (104.3 kg)  06/04/18 235 lb (106.6 kg)  05/24/18 235 lb (106.6 kg)     Objective:    Vital Signs:  BP (!) 161/78    Pulse (!) 58    Ht 5\' 11"  (1.803 m)    Wt 230 lb (104.3 kg)    BMI 32.08 kg/m   144/78 on repeat.   VITAL SIGNS:  reviewed GEN:  no acute distress EYES:  sclerae anicteric, EOMI - Extraocular Movements Intact RESPIRATORY:  normal respiratory effort, symmetric expansion CARDIOVASCULAR:  no peripheral edema SKIN:  no rash, lesions or ulcers. MUSCULOSKELETAL:  no obvious deformities. NEURO:  alert and oriented x 3, no obvious focal deficit PSYCH:  normal affect  ASSESSMENT & PLAN:    Paroxysmal atrial fibrillation - Has been on flecainide 100 mg twice a day.  Has had some neuropathy and the neurology stated that this may be worsening some of his symptoms.  Symptoms have been stable with neuropathy which he has had for a while and does not want to stop flecanide.  In review of Kerby Less note from atrial fibrillation clinic, our pharmacist found less than 1% incidence of flecainide-induced neuropathy thought to be secondary to the calcium channel blocking effects in the CNS.  It appears that it may be  reversible.  He wanted to think whether or not he wanted to make a change either to Multaq or sotalol  Peripheral neuropathy -Seen by neurology.  Does not have any evidence of peripheral vascular disease.  There was concern that the flecainide may be contributing to the peripheral neuropathy.  See above.  I certainly do not have any problem with him transitioning over to Multaq.  Hopefully it will be as efficacious.  Hopefully cost will not be prohibitive.  This might be a good alternative so that he does not have to go to the hospital for initiation like he would on sotalol.  He is going to look into the cost of Multaq.  He mentioned a program that he has through [facility] that pays for some of the medical expenses.  Obstructive sleep apnea -Dr. Radford Thomas has been following. Wearing CPAP  We will go ahead check a CBC and a basic metabolic profile given his chronic anticoagulation with Xarelto.   COVID-19 Education: The signs and symptoms of COVID-19 were discussed with the patient and how to seek care for testing (follow up with PCP or arrange E-visit).  The importance of social distancing was discussed today.  Time:   Today, I have spent 25 minutes with the patient with telehealth technology discussing the above problems.     Medication Adjustments/Labs and Tests Ordered: Current medicines are reviewed at length with the patient today.  Concerns regarding medicines are outlined above.   Tests Ordered: Orders Placed This Encounter  Procedures   CBC   Basic metabolic panel    Medication Changes: No orders of the defined types were placed in this encounter.   Disposition:  Follow up in 6 month(s)  Signed, Carlos Furbish, MD  11/03/2018 5:12 PM    [place]

## 2018-11-05 ENCOUNTER — Other Ambulatory Visit: Payer: Medicare Other | Admitting: *Deleted

## 2018-11-05 ENCOUNTER — Other Ambulatory Visit: Payer: Self-pay

## 2018-11-05 DIAGNOSIS — I10 Essential (primary) hypertension: Secondary | ICD-10-CM

## 2018-11-05 DIAGNOSIS — Z79899 Other long term (current) drug therapy: Secondary | ICD-10-CM

## 2018-11-05 DIAGNOSIS — I480 Paroxysmal atrial fibrillation: Secondary | ICD-10-CM

## 2018-11-05 LAB — CBC
Hematocrit: 41.7 % (ref 37.5–51.0)
Hemoglobin: 14.3 g/dL (ref 13.0–17.7)
MCH: 32.1 pg (ref 26.6–33.0)
MCHC: 34.3 g/dL (ref 31.5–35.7)
MCV: 94 fL (ref 79–97)
Platelets: 143 10*3/uL — ABNORMAL LOW (ref 150–450)
RBC: 4.45 x10E6/uL (ref 4.14–5.80)
RDW: 12 % (ref 11.6–15.4)
WBC: 4.8 10*3/uL (ref 3.4–10.8)

## 2018-11-05 LAB — BASIC METABOLIC PANEL
BUN/Creatinine Ratio: 16 (ref 10–24)
BUN: 16 mg/dL (ref 8–27)
CO2: 22 mmol/L (ref 20–29)
Calcium: 9 mg/dL (ref 8.6–10.2)
Chloride: 104 mmol/L (ref 96–106)
Creatinine, Ser: 0.98 mg/dL (ref 0.76–1.27)
GFR calc Af Amer: 91 mL/min/{1.73_m2} (ref >59)
GFR calc non Af Amer: 78 mL/min/{1.73_m2} (ref >59)
Glucose: 113 mg/dL — ABNORMAL HIGH (ref 65–99)
Potassium: 4.3 mmol/L (ref 3.5–5.2)
Sodium: 140 mmol/L (ref 134–144)

## 2018-11-10 ENCOUNTER — Ambulatory Visit: Payer: Medicare Other | Admitting: Cardiology

## 2019-01-10 ENCOUNTER — Other Ambulatory Visit (HOSPITAL_COMMUNITY): Payer: Self-pay | Admitting: Nurse Practitioner

## 2019-01-17 ENCOUNTER — Other Ambulatory Visit (HOSPITAL_COMMUNITY): Payer: Self-pay | Admitting: Nurse Practitioner

## 2019-01-17 DIAGNOSIS — I480 Paroxysmal atrial fibrillation: Secondary | ICD-10-CM

## 2019-01-18 NOTE — Telephone Encounter (Signed)
Prescription refill request for Xarelto received.   Last office visit: Dr. Marlou Porch (11-03-2018) Weight: 107 kg (03-31-2018) Age: 68 y.o. Scr: 0.98 (11-05-2018) CrCl:108 ml/min  Prescription refill sent.

## 2019-02-05 ENCOUNTER — Other Ambulatory Visit (HOSPITAL_COMMUNITY): Payer: Self-pay | Admitting: Nurse Practitioner

## 2019-04-01 ENCOUNTER — Encounter: Payer: Self-pay | Admitting: Cardiology

## 2019-04-01 ENCOUNTER — Ambulatory Visit: Payer: Medicare Other | Admitting: Cardiology

## 2019-04-01 ENCOUNTER — Other Ambulatory Visit: Payer: Self-pay

## 2019-04-01 VITALS — BP 138/68 | HR 49 | Ht 71.0 in | Wt 233.0 lb

## 2019-04-01 DIAGNOSIS — Z79899 Other long term (current) drug therapy: Secondary | ICD-10-CM | POA: Diagnosis not present

## 2019-04-01 DIAGNOSIS — I10 Essential (primary) hypertension: Secondary | ICD-10-CM | POA: Diagnosis not present

## 2019-04-01 DIAGNOSIS — I480 Paroxysmal atrial fibrillation: Secondary | ICD-10-CM

## 2019-04-01 DIAGNOSIS — G5790 Unspecified mononeuropathy of unspecified lower limb: Secondary | ICD-10-CM

## 2019-04-01 DIAGNOSIS — G4733 Obstructive sleep apnea (adult) (pediatric): Secondary | ICD-10-CM

## 2019-04-01 NOTE — Patient Instructions (Signed)
Medication Instructions:  The current medical regimen is effective;  continue present plan and medications.  If you need a refill on your cardiac medications before your next appointment, please call your pharmacy.   Follow-Up: At [facility], you and your health needs are our priority.  As part of our continuing mission to provide you with exceptional heart care, we have created designated Provider Care Teams.  These Care Teams include your primary Cardiologist (physician) and Advanced Practice Providers (APPs -  Physician Assistants and Nurse Practitioners) who all work together to provide you with the care you need, when you need it. You will need a follow up appointment in 6 months in the At [facility] and Dr Marlou Porch in 1 year.  Please call our office 2 months in advance to schedule this appointment.  You may see Candee Furbish, MD or one of the following Advanced Practice Providers on your designated Care Team:   Truitt Merle, NP Cecilie Kicks, NP . Kathyrn Drown, NP  Thank you for choosing [facility]!!

## 2019-04-01 NOTE — Progress Notes (Signed)
Cardiology Office Note:    Date:  04/01/2019   ID:  Carlos Thomas, DOB 1950/10/27, MRN [id number]  PCP:  Shirline Frees, MD  Cardiologist:  Candee Furbish, MD  Electrophysiologist:  None   Referring MD: Shirline Frees, MD     History of Present Illness:    Carlos Thomas is a 68 y.o. male  with paroxysmal atrial fibrillation status post ablations in both 2010 as well as 2013 with intermittent cardioversion on flecainide. -Back in December 2019 he felt his heart go into atrial fibrillation during stress and traffic.  He was rate controlled in the emergency room but then went back the second day to the ER with heart rates of 120 and was cardioverted.  He also has peripheral neuropathy.  Vascular studies were normal waveforms, triphasic.  This was reviewed by consultation, Dr. Fletcher Anon.  He also has some stasis dermatitis.  Our conversation previously centered mostly around potentially switching his flecainide over to [facility].  He has seen a neurologist with [facility] who mentioned that the flecainide may be the cause of his peripheral neuropathy which seems to be getting worse moving up his foot/leg.  His obstructive sleep apnea is being treated by Dr. Radford Pax  Heart wise doing ok he states. He knows when BP is coming up and takes and extra flec and does well. No cardioversion past year. X-wife had it and went away with medications. Does not want to try Multaq. If it not broke don't fix it. Pressure socks.  Previous neurologist did state that prior long-term alcohol use likely contributed to his peripheral neuropathy.  Overall this does frustrate him.  He has stopped drinking.  Denies any fevers chills nausea vomiting syncope bleeding  Past Medical History:  Diagnosis Date   Anticoagulant long-term use    xarelto   Balanitis    GERD (gastroesophageal reflux disease)    History of adenomatous polyp of colon    2006  tubular adenoma's and hyperplastic polyp's;   2011  tubular  adenoma   History of atrial flutter    s/p ablation atrial flutter/atrial fib in 2010   History of MRSA infection    1990's  infected wound near surgerical area    History of repair of hiatal hernia    HTN (hypertension)    Obesity (BMI 30-39.9)    OSA on CPAP    Mild OSA  per study 05-07-2012-- w AHI 6.57/hr now on 13cm H2O   PAF (paroxysmal atrial fibrillation) [facility]) primary cardiologist-  dr Marlou Porch   ep cardiologist-  dr Rayann Heman---  s/p  ablation afib and aflutter 2010  and  ablation afib/svt  2013   Peripheral neuropathy, hereditary/idiopathic    bilateral feet---  neurologist- dr patel   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[place], 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 , fireman   S/P radiofrequency ablation operation for arrhythmia    01-09-2009  cardiac ablation afib/flutter;   09-13-2011  ablation afib/svt and in ther right atrium   Wears glasses     Past Surgical History:  Procedure Laterality Date   ATRIAL FIBRILLATION ABLATION N/A 09/13/2011   Procedure: ATRIAL FIBRILLATION ABLATION;  Surgeon: Thompson Grayer, MD;  Location: [facility] CATH LAB;  Service: Cardiovascular;  Laterality: N/A;   CARDIAC ELECTROPHYSIOLOGY MAPPING AND ABLATION  [phone]   dr allred   successful ablation atrial fibrillation /  atrial flutter   CARDIAC ELECTROPHYSIOLOGY MAPPING AND ABLATION  09-13-2011   dr Rayann Heman   afib mapping/ ablation and  mapping/ablation in the right  atrium   CARDIOVASCULAR STRESS TEST  09/2006   per dr Marlou Porch note nuclear study normal w/ no ischemia   CARDIOVERSION N/A 09/21/2015   Procedure: CARDIOVERSION;  Surgeon: Lelon Perla, MD;  Location[facility] ENDOSCOPY;  Service: Cardiovascular;  Laterality: N/A;   CIRCUMCISION N/A 06/27/2016   Procedure: CIRCUMCISION ADULT;  Surgeon: Carolan Clines, MD;  Location: [facility];  Service: Urology;  Laterality: N/A;   COLONOSCOPY  last one 02/  2011   DIRECT CURRENT CARDIOVERSION  02/02/2010   dr Leonia Reeves   LAPAROSCOPIC  CHOLECYSTECTOMY  1990's   OPEN HIATAL HERNIA REPAIR  1970's   TEE WITHOUT CARDIOVERSION  09/12/2011   Procedure: TRANSESOPHAGEAL ECHOCARDIOGRAM (TEE);  Surgeon: Jettie Booze, MD;  Location: [facility] ENDOSCOPY;  Service: Cardiovascular;  Laterality: N/A;  trace AR, no LA/LAA thrombus, normal LVfunction, moderate descending aortic atherosclerosis   TEE WITHOUT CARDIOVERSION N/A 09/21/2015   Procedure: TRANSESOPHAGEAL ECHOCARDIOGRAM (TEE);  Surgeon: Lelon Perla, MD;  Location[facility] ENDOSCOPY;  Service: Cardiovascular;  Laterality: N/A; normal LV systolic funciton,  moderate LAE,  no LAA thrombus,  trace MR and TR   TRANSTHORACIC ECHOCARDIOGRAM  06/03/2014   ef 55-60%/  trivial MR and TR    Current Medications: Current Meds  Medication Sig   CARTIA XT 120 MG 24 hr capsule TAKE ONE CAPSULE BY MOUTH DAILY   diltiazem (CARDIZEM CD) 240 MG 24 hr capsule TAKE 1 CAPSULE BY MOUTH EVERY DAY   flecainide (TAMBOCOR) 100 MG tablet TAKE 1 TABLET BY MOUTH TWICE DAILY   omeprazole (PRILOSEC) 20 MG capsule Take 20 mg by mouth every morning.    tamsulosin (FLOMAX) 0.4 MG CAPS capsule Take 0.4 mg by mouth daily.   XARELTO 20 MG TABS tablet TAKE 1 TABLET BY MOUTH DAILY WITH SUPPER     Allergies:   Patient has no known allergies.   Social History   Socioeconomic History   Marital status: Divorced    Spouse name: Not on file   Number of children: Not on file   Years of education: Not on file   Highest education level: Not on file  Occupational History   Not on file  Social Needs   Financial resource strain: Not on file   Food insecurity    Worry: Not on file    Inability: Not on file   Transportation needs    Medical: Not on file    Non-medical: Not on file  Tobacco Use   Smoking status: Never Smoker   Smokeless tobacco: Never Used  Substance and Sexual Activity   Alcohol use: No    Alcohol/week: 1.0 standard drinks    Types: 1 Cans of beer per week    Comment: rare     Drug use: No   Sexual activity: Not on file  Lifestyle   Physical activity    Days per week: Not on file    Minutes per session: Not on file   Stress: Not on file  Relationships   Social connections    Talks on phone: Not on file    Gets together: Not on file    Attends religious service: Not on file    Active member of club or organization: Not on file    Attends meetings of clubs or organizations: Not on file    Relationship status: Not on file  Other Topics Concern   Not on file  Social History Narrative   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  Education: college.     Family History: The patient's family history includes CAD in his father; Heart attack in his father. There is no history of Atrial fibrillation or Anesthesia problems.  ROS:   Please see the history of present illness.     All other systems reviewed and are negative.  EKGs/Labs/Other Studies Reviewed:    The following studies were reviewed today: Transesophageal echocardiogram 2017-normal EF  EKG: 04/01/2019-sinus bradycardia 49 with nonspecific T wave changes.  06/04/18 was personally reviewed today and demonstrated:  Sinus bradycardia heart rate 57, possible left posterior fascicular block  Recent Labs: 11/05/2018: BUN 16; Creatinine, Ser 0.98; Hemoglobin 14.3; Platelets 143; Potassium 4.3; Sodium 140  Recent Lipid Panel No results found for: CHOL, TRIG, HDL, CHOLHDL, VLDL, LDLCALC, LDLDIRECT  Physical Exam:    VS:  BP 138/68    Pulse (!) 49    Ht 5\' 11"  (1.803 m)    Wt 233 lb (105.7 kg)    SpO2 97%    BMI 32.50 kg/m     Wt Readings from Last 3 Encounters:  04/01/19 233 lb (105.7 kg)  11/03/18 230 lb (104.3 kg)  06/04/18 235 lb (106.6 kg)     GEN:  Well nourished, well developed in no acute distress,  HEENT: Normal NECK: No JVD; No carotid bruits LYMPHATICS: No lymphadenopathy CARDIAC: RRR, no murmurs, rubs,  gallops RESPIRATORY:  Clear to auscultation without rales, wheezing or rhonchi  ABDOMEN: Soft, non-tender, non-distended MUSCULOSKELETAL:  No edema; No deformity  SKIN: Warm and dry NEUROLOGIC:  Alert and oriented x 3 PSYCHIATRIC:  Normal affect   ASSESSMENT:    1. PAF (paroxysmal atrial fibrillation) ([place])   2. Essential hypertension   3. Long-term use of high-risk medication   4. Neuropathy of lower extremity, unspecified laterality   5. OSA (obstructive sleep apnea)    PLAN:    In order of problems listed above:   Paroxysmal atrial fibrillation - Has been on flecainide 100 mg twice a day.  Has had some neuropathy and the neurology stated that this may be worsening some of his symptoms.  Symptoms have been stable with neuropathy which he has had for a while and does not want to stop flecanide.  In review of Carlos Thomas note from atrial fibrillation clinic, our pharmacist found Thomas than 1% incidence of flecainide-induced neuropathy thought to be secondary to the calcium channel blocking effects in the CNS.  It appears that it may be reversible.  He wanted to think whether or not he wanted to make a change either to Multaq or sotalol.  At this point, he does not want to switch off of flecainide.  We discussed again.  Peripheral neuropathy -Seen by neurology.  Does not have any evidence of peripheral vascular disease.  No vascular issue.  There was concern that the flecainide may be contributing to the peripheral neuropathy.  Definitely explained to him that amiodarone has peripheral neuropathy side effects.  Thankfully he is not on this.  See above.  I certainly do not have any problem with him transitioning over to Multaq.  Hopefully it will be as efficacious.  Hopefully cost will not be prohibitive.  This might be a good alternative so that he does not have to go to the hospital for initiation like he would on sotalol.  Overall, he wants to stay on the flecainide.  I am fine with  this.  Dr. Kenton Kingfisher did mention  some support stockings.  He knows about the place in [place].  Exercise, weight loss.  Fruits and vegetables.  Obstructive sleep apnea -Dr. Radford Pax has been following. Wearing CPAP -Continue with weight loss.   Medication Adjustments/Labs and Tests Ordered: Current medicines are reviewed at length with the patient today.  Concerns regarding medicines are outlined above.  Orders Placed This Encounter  Procedures   EKG 12-Lead   No orders of the defined types were placed in this encounter.   Patient Instructions  Medication Instructions:  The current medical regimen is effective;  continue present plan and medications.  If you need a refill on your cardiac medications before your next appointment, please call your pharmacy.   Follow-Up: At [facility], you and your health needs are our priority.  As part of our continuing mission to provide you with exceptional heart care, we have created designated Provider Care Teams.  These Care Teams include your primary Cardiologist (physician) and Advanced Practice Providers (APPs -  Physician Assistants and Nurse Practitioners) who all work together to provide you with the care you need, when you need it. You will need a follow up appointment in 6 months in the At [facility] and Dr Marlou Porch in 1 year.  Please call our office 2 months in advance to schedule this appointment.  You may see Candee Furbish, MD or one of the following Advanced Practice Providers on your designated Care Team:   Truitt Merle, NP Cecilie Kicks, NP  Kathyrn Drown, NP  Thank you for choosing [facility]!!          Signed, Candee Furbish, MD  04/01/2019 10:21 AM    [place]

## 2019-05-05 ENCOUNTER — Telehealth: Payer: Self-pay | Admitting: *Deleted

## 2019-05-05 NOTE — Telephone Encounter (Signed)

## 2019-05-06 ENCOUNTER — Other Ambulatory Visit (HOSPITAL_COMMUNITY): Payer: Self-pay | Admitting: Cardiology

## 2019-05-10 ENCOUNTER — Encounter: Payer: Self-pay | Admitting: Cardiology

## 2019-05-10 ENCOUNTER — Other Ambulatory Visit: Payer: Self-pay

## 2019-05-10 ENCOUNTER — Telehealth: Payer: Self-pay | Admitting: *Deleted

## 2019-05-10 ENCOUNTER — Telehealth (INDEPENDENT_AMBULATORY_CARE_PROVIDER_SITE_OTHER): Payer: Medicare Other | Admitting: Cardiology

## 2019-05-10 VITALS — BP 150/76 | HR 47 | Ht 71.0 in | Wt 232.0 lb

## 2019-05-10 DIAGNOSIS — E669 Obesity, unspecified: Secondary | ICD-10-CM | POA: Diagnosis not present

## 2019-05-10 DIAGNOSIS — G4733 Obstructive sleep apnea (adult) (pediatric): Secondary | ICD-10-CM | POA: Diagnosis not present

## 2019-05-10 DIAGNOSIS — I10 Essential (primary) hypertension: Secondary | ICD-10-CM

## 2019-05-10 NOTE — Telephone Encounter (Signed)
-----   Message from Sueanne Margarita, MD sent at 05/10/2019  3:00 PM EST ----- Please order  Resmed CPAP on 7cm H2O with heated humidity and ResMed large Quattro full face mask.  He will need 10 week followup with me after he gets his device.

## 2019-05-10 NOTE — Patient Instructions (Signed)
Medication Instructions:   Your physician recommends that you continue on your current medications as directed. Please refer to the Current Medication list given to you today.  *If you need a refill on your cardiac medications before your next appointment, please call your pharmacy*    Follow-Up:  Our Sleep Coordinator Gershon Cull, will be in contact with you soon. Her direct contact information is [phone].

## 2019-05-10 NOTE — Telephone Encounter (Signed)
Order placed to [facility] via community message. 

## 2019-05-10 NOTE — Progress Notes (Signed)
Virtual Visit via Telephone Note   This visit type was conducted due to national recommendations for restrictions regarding the COVID-19 Pandemic (e.g. social distancing) in an effort to limit this patient's exposure and mitigate transmission in our community.  Due to his co-morbid illnesses, this patient is at least at moderate risk for complications without adequate follow up.  This format is felt to be most appropriate for this patient at this time.  The patient did not have access to video technology/had technical difficulties with video requiring transitioning to audio format only (telephone).  All issues noted in this document were discussed and addressed.  No physical exam could be performed with this format.  Please refer to the patient's chart for his  consent to telehealth for [facility].  Evaluation Performed:  Follow-up visit  This visit type was conducted due to national recommendations for restrictions regarding the COVID-19 Pandemic (e.g. social distancing).  This format is felt to be most appropriate for this patient at this time.  All issues noted in this document were discussed and addressed.  No physical exam was performed (except for noted visual exam findings with Video Visits).  Please refer to the patient's chart (MyChart message for video visits and phone note for telephone visits) for the patient's consent to telehealth for [facility].  Date:  05/10/2019   ID:  Carlos Thomas, DOB [birth year], MRN [id number]  Patient Location:  Home  Provider location:   [place]  PCP:  Shirline Frees, MD  Cardiologist:  Candee Furbish, MD  Sleep Medicine:  Fransico Him, MD Electrophysiologist:  None   Chief Complaint:  OSA  History of Present Illness:    Carlos Thomas is a 68 y.o. male who presents via audio/video conferencing for a telehealth visit today.    Carlos Thomas is a 68 y.o. male with a hx of OSA and obesity. He is doing well with his CPAP device and  thinks that he has gotten used to it.  He tolerates the mask and feels the pressure is adequate.  Since going on CPAP he feels rested in the am and has no significant daytime sleepiness.  He denies any significant mouth or nasal dryness or nasal congestion.  He does not think that he snores.    The patient does not have symptoms concerning for COVID-19 infection (fever, chills, cough, or new shortness of breath).    Prior CV studies:   The following studies were reviewed today:  PAP compliance download  Past Medical History:  Diagnosis Date  . Anticoagulant long-term use    xarelto  . Balanitis   . GERD (gastroesophageal reflux disease)   . History of adenomatous polyp of colon    2006  tubular adenoma's and hyperplastic polyp's;   2011  tubular adenoma  . History of atrial flutter    s/p ablation atrial flutter/atrial fib in 2010  . History of MRSA infection    1990's  infected wound near surgerical area   . History of repair of hiatal hernia   . HTN (hypertension)   . Obesity (BMI 30-39.9)   . OSA on CPAP    Mild OSA  per study 05-07-2012-- w AHI 6.57/hr now on 13cm H2O  . PAF (paroxysmal atrial fibrillation) [facility]) primary cardiologist-  dr Marlou Porch   ep cardiologist-  dr Rayann Heman---  s/p  ablation afib and aflutter 2010  and  ablation afib/svt  2013  . Peripheral neuropathy, hereditary/idiopathic    bilateral feet---  neurologist-  dr patel  . 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 S/P radiofrequency ablation operation for arrhythmia    01-09-2009  cardiac ablation afib/flutter;   09-13-2011  ablation afib/svt and in ther right atrium  . Wears glasses    Past Surgical History:  Procedure Laterality Date  . ATRIAL FIBRILLATION ABLATION N/A 09/13/2011   Procedure: ATRIAL FIBRILLATION ABLATION;  Surgeon: Thompson Grayer, MD;  Location: [facility] CATH LAB;  Service: Cardiovascular;  Laterality: N/A;  . CARDIAC ELECTROPHYSIOLOGY MAPPING AND ABLATION  [phone]    dr allred   successful ablation atrial fibrillation /  atrial flutter  . CARDIAC ELECTROPHYSIOLOGY MAPPING AND ABLATION  09-13-2011   dr Rayann Heman   afib mapping/ ablation and  mapping/ablation in the right atrium  . CARDIOVASCULAR STRESS TEST  09/2006   per dr Marlou Porch note nuclear study normal w/ no ischemia  . CARDIOVERSION N/A 09/21/2015   Procedure: CARDIOVERSION;  Surgeon: Lelon Perla, MD;  Location: [facility] ENDOSCOPY;  Service: Cardiovascular;  Laterality: N/A;  . CIRCUMCISION N/A 06/27/2016   Procedure: CIRCUMCISION ADULT;  Surgeon: Carolan Clines, MD;  Location: [facility];  Service: Urology;  Laterality: N/A;  . COLONOSCOPY  last one 02/  2011  . DIRECT CURRENT CARDIOVERSION  02/02/2010   dr Leonia Reeves  . LAPAROSCOPIC CHOLECYSTECTOMY  1990's  . OPEN HIATAL HERNIA REPAIR  1970's  . TEE WITHOUT CARDIOVERSION  09/12/2011   Procedure: TRANSESOPHAGEAL ECHOCARDIOGRAM (TEE);  Surgeon: Jettie Booze, MD;  Location: [facility] ENDOSCOPY;  Service: Cardiovascular;  Laterality: N/A;  trace AR, no LA/LAA thrombus, normal LVfunction, moderate descending aortic atherosclerosis  . TEE WITHOUT CARDIOVERSION N/A 09/21/2015   Procedure: TRANSESOPHAGEAL ECHOCARDIOGRAM (TEE);  Surgeon: Lelon Perla, MD;  Location: [facility] ENDOSCOPY;  Service: Cardiovascular;  Laterality: N/A; normal LV systolic funciton,  moderate LAE,  no LAA thrombus,  trace MR and TR  . TRANSTHORACIC ECHOCARDIOGRAM  06/03/2014   ef 55-60%/  trivial MR and TR     Current Meds  Medication Sig  . CARTIA XT 120 MG 24 hr capsule TAKE ONE CAPSULE BY MOUTH DAILY  . diltiazem (CARDIZEM CD) 240 MG 24 hr capsule TAKE 1 CAPSULE BY MOUTH EVERY DAY  . flecainide (TAMBOCOR) 100 MG tablet TAKE 1 TABLET BY MOUTH TWICE DAILY  . omeprazole (PRILOSEC) 20 MG capsule Take 20 mg by mouth every morning.   . tamsulosin (FLOMAX) 0.4 MG CAPS capsule Take 0.4 mg by mouth daily.  Alveda Reasons 20 MG TABS tablet TAKE 1 TABLET BY MOUTH DAILY WITH SUPPER      Allergies:   Patient has no known allergies.   Social History   Tobacco Use  . Smoking status: Never Smoker  . Smokeless tobacco: Never Used  Substance Use Topics  . Alcohol use: No    Alcohol/week: 1.0 standard drinks    Types: 1 Cans of beer per week    Comment: rare  . Drug use: No     Family Hx: The patient's family history includes CAD in his father; Heart attack in his father. There is no history of Atrial fibrillation or Anesthesia problems.  ROS:   Please see the history of present illness.     All other systems reviewed and are negative.   Labs/Other Tests and Data Reviewed:    Recent Labs: 11/05/2018: BUN 16; Creatinine, Ser 0.98; Hemoglobin 14.3; Platelets 143; Potassium 4.3; Sodium 140   Recent Lipid Panel No results found for: CHOL, TRIG, HDL, CHOLHDL, LDLCALC,  LDLDIRECT  Wt Readings from Last 3 Encounters:  05/10/19 232 lb (105.2 kg)  04/01/19 233 lb (105.7 kg)  11/03/18 230 lb (104.3 kg)     Objective:    Vital Signs:  BP (!) 150/76   Pulse (!) 47   Ht 5\' 11"  (1.803 m)   Wt 232 lb (105.2 kg)   BMI 32.36 kg/m     ASSESSMENT & PLAN:    1.  OSA -The patient is tolerating PAP therapy well without any problems.  The patient has been using and benefiting from PAP use and will continue to benefit from therapy. I will get a download from the DME.  He has a copy and said his AHI was 0.3/hr and and his pressure is set on 7cm H2O and 96% compliance in using more than 4 hours nightly.  He would like to have a Rx for a new CPAP device.  He would like a new mask as well.  I will order a new Resmed CPAP with heated humidity and mask of choice.  He will need to see me back 10 weeks after he gets his new device to document compliance.    2.  Obesity -I have encouraged him to get into a routine exercise program and cut back on carbs and portions.   3.  HTN -BP mildly elevated today -continue Cartia XT 360mg  daily -I have asked him to check his BP daily for a  week and call with results  COVID-19 Education: The signs and symptoms of COVID-19 were discussed with the patient and how to seek care for testing (follow up with PCP or arrange E-visit).  The importance of social distancing was discussed today.  Patient Risk:   After full review of this patient's clinical status, I feel that they are at least moderate risk at this time.  Time:   Today, I have spent 20 minutes directly with the patient on telemedicine discussing medical problems including OSA, HTN, Obesity.  We also reviewed the symptoms of COVID 19 and the ways to protect against contracting the virus with telehealth technology.  I spent an additional 5 minutes reviewing patient's chart including PAP compliance download.  Medication Adjustments/Labs and Tests Ordered: Current medicines are reviewed at length with the patient today.  Concerns regarding medicines are outlined above.  Tests Ordered: No orders of the defined types were placed in this encounter.  Medication Changes: No orders of the defined types were placed in this encounter.   Disposition:  Follow up 10 weeks after he gets his new PAP device  Signed, Fransico Him, MD  05/10/2019 2:57 PM    [place]

## 2019-07-06 ENCOUNTER — Other Ambulatory Visit: Payer: Self-pay | Admitting: Family Medicine

## 2019-07-06 ENCOUNTER — Ambulatory Visit
Admission: RE | Admit: 2019-07-06 | Discharge: 2019-07-06 | Disposition: A | Discharge status: Home / Self Care | Payer: Medicare PPO | Source: Ambulatory Visit | Attending: Family Medicine | Admitting: Family Medicine

## 2019-07-06 DIAGNOSIS — M79604 Pain in right leg: Secondary | ICD-10-CM

## 2019-07-08 ENCOUNTER — Other Ambulatory Visit: Payer: Self-pay | Admitting: Family Medicine

## 2019-07-08 DIAGNOSIS — M7989 Other specified soft tissue disorders: Secondary | ICD-10-CM

## 2019-07-09 ENCOUNTER — Ambulatory Visit
Admission: RE | Admit: 2019-07-09 | Discharge: 2019-07-09 | Disposition: A | Discharge status: Home / Self Care | Payer: Medicare PPO | Source: Ambulatory Visit | Attending: Family Medicine | Admitting: Family Medicine

## 2019-07-09 DIAGNOSIS — M7989 Other specified soft tissue disorders: Secondary | ICD-10-CM

## 2019-07-14 ENCOUNTER — Other Ambulatory Visit (HOSPITAL_COMMUNITY): Payer: Self-pay | Admitting: Nurse Practitioner

## 2019-07-14 DIAGNOSIS — I480 Paroxysmal atrial fibrillation: Secondary | ICD-10-CM

## 2019-07-14 NOTE — Telephone Encounter (Signed)
Xarelto 20mg  refill request received. Pt is 69 years old, weight-105.2kg, Crea-0.98 on 11/05/2018, last seen by Dr. Radford Pax on 05/10/2019 and Dr. Marlou Porch on 04/01/2019, Diagnosis-Afib, CrCl-104.58ml/min; Dose is appropriate based on dosing criteria. Will send in refill to requested pharmacy.

## 2019-07-21 ENCOUNTER — Other Ambulatory Visit: Payer: Self-pay

## 2019-07-21 ENCOUNTER — Ambulatory Visit (INDEPENDENT_AMBULATORY_CARE_PROVIDER_SITE_OTHER): Payer: Medicare PPO

## 2019-07-21 ENCOUNTER — Ambulatory Visit: Payer: Medicare PPO | Admitting: Orthopaedic Surgery

## 2019-07-21 VITALS — Ht 71.0 in | Wt 232.0 lb

## 2019-07-21 DIAGNOSIS — M25561 Pain in right knee: Secondary | ICD-10-CM

## 2019-07-21 MED ORDER — METHYLPREDNISOLONE ACETATE 40 MG/ML IJ SUSP
40.0000 mg | INTRAMUSCULAR | Status: AC | PRN
  Administered 2019-07-21: 16:00:00 40 mg via INTRA_ARTICULAR

## 2019-07-21 MED ORDER — LIDOCAINE HCL 1 % IJ SOLN
3.0000 mL | INTRAMUSCULAR | Status: AC | PRN
  Administered 2019-07-21: 3 mL

## 2019-07-21 NOTE — Progress Notes (Signed)
Office Visit Note   Patient: Carlos Thomas           Date of Birth: Nov 25, 1950           MRN: [id number] Visit Date: 07/21/2019              Requested by: Shirline Frees, MD [address],  [place] [id number] PCP: Shirline Frees, MD   Assessment & Plan: Visit Diagnoses:  1. Acute pain of right knee     Plan: I have recommended Voltaren gel to try over the areas of tenderness at the tibial tubercle and the medial joint line aspect of his right knee.  Also recommended an intra-articular steroid injection to try to treat the inflammation in the right knee.  He agrees with this treatment plan and did tolerate the steroid injection well.  I did describe in detail the risk and benefits of steroid injections.  We will see him back in 2 weeks to see how he is doing overall clinically.  All questions concerns were answered and addressed.  Follow-Up Instructions: Return in about 2 weeks (around 08/04/2019).   Orders:  Orders Placed This Encounter  Procedures  . Large Joint Inj  . XR Knee 1-2 Views Right   No orders of the defined types were placed in this encounter.     Procedures: Large Joint Inj: R knee on 07/21/2019 4:00 PM Indications: diagnostic evaluation and pain Details: 22 G 1.5 in needle, superolateral approach  Arthrogram: No  Medications: 3 mL lidocaine 1 %; 40 mg methylPREDNISolone acetate 40  MG/ML Outcome: tolerated well, no immediate complications Procedure, treatment alternatives, risks and benefits explained, specific risks discussed. Consent was given by the patient. Immediately prior to procedure a time out was called to verify the correct patient, procedure, equipment, support staff and site/side marked as required. Patient  was prepped and draped in the usual sterile fashion.       Clinical Data: No additional findings.   Subjective: Chief Complaint  Patient presents with  . Right Knee - Pain  The patient comes in with chief complaint of 3 weeks of right knee pain.  He was kneeling down working on his knees and not long after that he had pain in that right knee.  He points to the tibial tubercle and the medial aspect of his knee as a source of his pain.  He said not a long time after that he did have some type of cellulitis or infection in his tibia that was treated with oral antibiotics.  He is not a diabetic.  He has never had surgery on that knee or any knee issues before.  He does state that his right knee hurts more with walking on uneven surfaces and going up or down a gradient.  HPI  Review of Systems He currently denies any headache, chest pain, shortness of breath, fever, chills, nausea, vomiting  Objective: Vital Signs: Ht 5\' 11"  (1.803 m)   Wt 232 lb (105.2 kg)   BMI 32.36 kg/m   Physical Exam He is alert and orient x3 and in no acute distress Ortho Exam Examination of his right knee shows normal flexion and extension.  His extensor mechanism is intact.  He has pain over the tibial tubercle as well as the medial collateral ligament of the right knee.  There is no knee joint effusion.  His knee feels ligamentously stable. Specialty Comments:  No specialty comments available.  Imaging: XR Knee 1-2 Views Right  Result Date: 07/21/2019 2 views of the right knee showed no acute findings.  The joint space is still well-maintained.    PMFS  History: Patient Active Problem List   Diagnosis Date Noted  . Hereditary and idiopathic peripheral neuropathy 06/03/2016  . URI (upper respiratory infection) 05/27/2014  . Obesity (BMI 30-39.9)   . OSA (obstructive sleep apnea) 06/11/2013  . Essential hypertension 02/07/2009  . PAF (paroxysmal atrial fibrillation) ([place]) 02/07/2009  . Atrial flutter ([place]) 02/07/2009   Past Medical History:  Diagnosis Date  . Anticoagulant long-term use    xarelto  . Balanitis   . GERD (gastroesophageal reflux disease)   . History of adenomatous polyp of colon    2006  tubular adenoma's and hyperplastic polyp's;   2011  tubular adenoma  . History of atrial flutter    s/p ablation atrial flutter/atrial fib in 2010  . History of MRSA infection    1990's  infected wound near surgerical area   . History of repair of hiatal hernia   . HTN (hypertension)   . Obesity (BMI 30-39.9)   . OSA on CPAP    Mild OSA  per study 05-07-2012-- w AHI 6.57/hr now on 13cm H2O  . PAF (paroxysmal atrial fibrillation) [facility]) primary cardiologist-  dr Marlou Porch   ep cardiologist-  dr Rayann Heman---  s/p  ablation afib and aflutter 2010  and  ablation afib/svt  2013  . Peripheral neuropathy, hereditary/idiopathic    bilateral feet---  neurologist- dr patel  . 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 S/P radiofrequency ablation operation for arrhythmia    01-09-2009  cardiac ablation afib/flutter;   09-13-2011  ablation afib/svt and in ther right atrium  . Wears glasses     Family History  Problem Relation Age of Onset  . CAD Father   .  Heart attack Father   . Atrial fibrillation Neg Hx   . Anesthesia problems Neg Hx     Past Surgical History:  Procedure Laterality Date  . ATRIAL FIBRILLATION ABLATION N/A 09/13/2011   Procedure: ATRIAL FIBRILLATION ABLATION;  Surgeon: Thompson Grayer, MD;  Location: [facility] CATH LAB;  Service: Cardiovascular;  Laterality: N/A;  . CARDIAC ELECTROPHYSIOLOGY MAPPING AND  ABLATION  [phone]   dr allred   successful ablation atrial fibrillation /  atrial flutter  . CARDIAC ELECTROPHYSIOLOGY MAPPING AND ABLATION  09-13-2011   dr Rayann Heman   afib mapping/ ablation and  mapping/ablation in the right atrium  . CARDIOVASCULAR STRESS TEST  09/2006   per dr Marlou Porch note nuclear study normal w/ no ischemia  . CARDIOVERSION N/A 09/21/2015   Procedure: CARDIOVERSION;  Surgeon: Lelon Perla, MD;  Location: [facility] ENDOSCOPY;  Service: Cardiovascular;  Laterality: N/A;  . CIRCUMCISION N/A 06/27/2016   Procedure: CIRCUMCISION ADULT;  Surgeon: Carolan Clines, MD;  Location: [facility];  Service: Urology;  Laterality: N/A;  . COLONOSCOPY  last one 02/  2011  . DIRECT CURRENT CARDIOVERSION  02/02/2010   dr Leonia Reeves  . LAPAROSCOPIC CHOLECYSTECTOMY  1990's  . OPEN HIATAL HERNIA REPAIR  1970's  . TEE WITHOUT CARDIOVERSION  09/12/2011   Procedure: TRANSESOPHAGEAL ECHOCARDIOGRAM (TEE);  Surgeon: Jettie Booze, MD;  Location[facility] ENDOSCOPY;  Service: Cardiovascular;  Laterality: N/A;  trace AR, no LA/LAA thrombus, normal LVfunction, moderate descending aortic atherosclerosis  . TEE WITHOUT CARDIOVERSION N/A 09/21/2015   Procedure: TRANSESOPHAGEAL ECHOCARDIOGRAM (TEE);  Surgeon: Lelon Perla, MD;  Location: [facility] ENDOSCOPY;  Service: Cardiovascular;  Laterality: N/A; normal LV systolic funciton,  moderate LAE,  no LAA thrombus,  trace MR and TR  . TRANSTHORACIC ECHOCARDIOGRAM  06/03/2014   ef 55-60%/  trivial MR and TR   Social History   Occupational History  . Not on file  Tobacco Use  . Smoking status: Never Smoker  . Smokeless tobacco: Never Used  Substance and Sexual Activity  . Alcohol use: No    Alcohol/week: 1.0 standard drinks    Types: 1 Cans of beer per week    Comment: rare  . Drug use: No  . Sexual activity: Not on file

## 2019-08-04 ENCOUNTER — Other Ambulatory Visit: Payer: Self-pay

## 2019-08-04 ENCOUNTER — Ambulatory Visit: Payer: Medicare PPO | Admitting: Orthopaedic Surgery

## 2019-08-04 ENCOUNTER — Encounter: Payer: Self-pay | Admitting: Orthopaedic Surgery

## 2019-08-04 DIAGNOSIS — M25561 Pain in right knee: Secondary | ICD-10-CM

## 2019-08-04 NOTE — Progress Notes (Signed)
Office Visit Note   Patient: Carlos Thomas           Date of Birth: [birth year]           MRN: [id number] Visit Date: 08/04/2019              Requested by: Carlos Frees, MD [address] [place],  [place] [id number] PCP: Carlos Frees, MD   Assessment & Plan: Visit Diagnoses:  1. Acute pain of right knee     Plan:  Discussed with him knee strengthening exercises and knee friendly exercises.  Discussed with him stretching exercises for plantar fasciitis and proper shoe wear.  In regards to the heel lift would not recommend as I did not feel that he has a leg length discrepancy.  He will call our office if his pain persist or becomes worse.  Otherwise follow-up as needed.  Recommend that he use Voltaren gel intermittently on his knee.  Questions were encouraged and answered by Dr. Ninfa Thomas and myself.  He may benefit from physical therapy for quad strengthening he will call our office if he feels this is necessary.  Follow-Up Instructions: Return if symptoms worsen or fail to improve.   Orders:  No orders of the defined types were placed in this encounter.  No orders of the defined types were placed in this encounter.     Procedures: No procedures performed   Clinical Data: No additional findings.   Subjective: Chief Complaint  Patient presents with  . Right Knee - Follow-up    HPI  Mr. Carlos Thomas returns today follow-up of his right knee status post injection 07/21/2019 he states the knee is about 90% better.  States the injection worked pretty good but did not "kick in" until about 4 to 5 days.  He denies any mechanical symptoms of the right knee.  Has questions about the use of Voltaren gel and the fact that he is also on Xarelto.  He also has questions about leg length discrepancy he has been told he has by chiropractor along with right heel pain.  Review of Systems See HPI otherwise negative or noncontributory.  Objective: Vital Signs: There were  no vitals taken for this visit.  Physical Exam Constitutional:      Appearance: He is not ill-appearing or diaphoretic.  Neurological:     Mental Status: He is alert and oriented to person, place, and time.  Psychiatric:        Mood and Affect: Mood normal.     Ortho Exam Right knee full extension full flexion slight patellofemoral crepitus compared to the left knee.  No instability of either knee with valgus varus stressing.  No abnormal warmth erythema or ecchymosis of either knee.  Tenderness over the right VMO region.  Right knee minimal tenderness over the medial collateral ligament region.  Remainder of the right knee is nontender. Leg lengths appear equal with the understanding and also lying down.  Tight gastroc on the right.  Tenderness over the medial tubercle of the calcaneus right heel. Specialty Comments:  No specialty comments available.  Imaging: No results found.   PMFS History: Patient Active Problem List   Diagnosis Date Noted  . Hereditary and idiopathic peripheral neuropathy 06/03/2016  . URI (upper respiratory infection) 05/27/2014  . Obesity (BMI 30-39.9)   . OSA (obstructive sleep apnea) 06/11/2013  . Essential hypertension 02/07/2009  . PAF (paroxysmal atrial fibrillation) ([place]) 02/07/2009  . Atrial flutter (Carlos Thomas) 02/07/2009   Past Medical  History:  Diagnosis Date  . Anticoagulant long-term use    xarelto  . Balanitis   . GERD (gastroesophageal reflux disease)   . History of adenomatous polyp of colon    2006  tubular adenoma's and hyperplastic polyp's;   2011  tubular adenoma  . History of atrial flutter    s/p ablation atrial flutter/atrial fib in 2010  . History of MRSA infection    1990's  infected wound near surgerical area   . History of repair of hiatal hernia   . HTN (hypertension)   . Obesity (BMI 30-39.9)   . OSA on CPAP    Mild OSA  per study 05-07-2012-- w AHI 6.57/hr now on 13cm H2O  . PAF (paroxysmal atrial fibrillation) [facility])  primary cardiologist-  dr Carlos Thomas   ep cardiologist-  dr Carlos Thomas---  s/p  ablation afib and aflutter 2010  and  ablation afib/svt  2013  . Peripheral neuropathy, hereditary/idiopathic    bilateral feet---  neurologist- dr Carlos Thomas  . 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 S/P radiofrequency ablation operation for arrhythmia    01-09-2009  cardiac ablation afib/flutter;   09-13-2011  ablation afib/svt and in ther right atrium  . Wears glasses     Family History  Problem Relation Age of Onset  . CAD Father   . Heart attack Father   . Atrial fibrillation Neg Hx   . Anesthesia problems Neg Hx     Past Surgical History:  Procedure Laterality Date  . ATRIAL FIBRILLATION ABLATION N/A 09/13/2011   Procedure: ATRIAL FIBRILLATION ABLATION;  Surgeon: Carlos Grayer, MD;  [facility] CATH LAB;  Service: Cardiovascular;  Laterality: N/A;  . CARDIAC ELECTROPHYSIOLOGY MAPPING AND ABLATION  [phone]   dr allred   successful ablation atrial fibrillation /  atrial flutter  . CARDIAC ELECTROPHYSIOLOGY MAPPING AND ABLATION  09-13-2011   dr Carlos Thomas   afib mapping/ ablation and  mapping/ablation in the right atrium  . CARDIOVASCULAR STRESS TEST  09/2006   per dr Carlos Thomas note nuclear study normal w/ no ischemia  . CARDIOVERSION N/A 09/21/2015   Procedure: CARDIOVERSION;  Surgeon: Carlos Perla, MD;  Location: [facility] ENDOSCOPY;  Service: Cardiovascular;  Laterality: N/A;  . CIRCUMCISION N/A 06/27/2016   Procedure: CIRCUMCISION ADULT;  Surgeon: Carolan Clines, MD;  Location: [facility];  Service: Urology;  Laterality: N/A;  . COLONOSCOPY  last one 02/  2011  . DIRECT CURRENT CARDIOVERSION  02/02/2010   dr Leonia Reeves  . LAPAROSCOPIC CHOLECYSTECTOMY  1990's  . OPEN HIATAL HERNIA REPAIR  1970's  . TEE WITHOUT CARDIOVERSION  09/12/2011   Procedure: TRANSESOPHAGEAL ECHOCARDIOGRAM (TEE);  Surgeon: Jettie Booze, MD;  Location: [facility] ENDOSCOPY;  Service: Cardiovascular;   Laterality: N/A;  trace AR, no LA/LAA thrombus, normal LVfunction, moderate descending aortic atherosclerosis  . TEE WITHOUT CARDIOVERSION N/A 09/21/2015   Procedure: TRANSESOPHAGEAL ECHOCARDIOGRAM (TEE);  Surgeon: Carlos Perla, MD;  Location: [facility] ENDOSCOPY;  Service: Cardiovascular;  Laterality: N/A; normal LV systolic funciton,  moderate LAE,  no LAA thrombus,  trace MR and TR  . TRANSTHORACIC ECHOCARDIOGRAM  06/03/2014   ef 55-60%/  trivial MR and TR   Social History   Occupational History  . Not on file  Tobacco Use  . Smoking status: Never Smoker  . Smokeless tobacco: Never Used  Substance and Sexual Activity  . Alcohol use: No    Alcohol/week: 1.0 standard drinks    Types: 1 Cans of beer per  week    Comment: rare  . Drug use: No  . Sexual activity: Not on file

## 2019-08-20 ENCOUNTER — Telehealth: Payer: Self-pay | Admitting: Cardiology

## 2019-08-20 NOTE — Telephone Encounter (Signed)
Patient needs a signed letter from Dr. Radford Pax stating that he is compliant with his CPAP as well as a letter from Dr. Marlou Porch stating that he is clear from a cardiac standpoint to fly.  Advised patient that I would get a letter from Dr. Radford Pax and will forward message to Dr. Marlou Porch and his nurse.

## 2019-08-20 NOTE — Telephone Encounter (Signed)
Carlye, Please write a letter stating that patient has been compliant with his PAP device using it 96% of the time > 4 hours and AHI 0.3/hr.  Also send a copy of his last OV and the last download which you can get from [facility].

## 2019-08-20 NOTE — Telephone Encounter (Signed)
New Message  Patient is calling in to speak with Dr. Radford Pax or her nurse. 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 Patient also needs a letter from Dr. Marlou Porch explaining his heart health and saying that it is ok for patient to fly.   Please assist.

## 2019-08-24 NOTE — Telephone Encounter (Signed)
Download printed. °

## 2019-08-26 NOTE — Telephone Encounter (Signed)
Patient also needs a letter from Dr. Marlou Porch explaining his heart health and saying that it is ok for patient to fly.   Please assist  Will forward to Dr Marlou Porch for him determine if he will supply a letter and what he would like for it to say.

## 2019-08-26 NOTE — Telephone Encounter (Signed)
Carlos Thomas is calling to follow up on the status of the letter approving him to fly from Dr. Marlou Porch. He states the deadline for this letter to be turned in is 09/02/19. Please advise

## 2019-08-27 NOTE — Telephone Encounter (Signed)
All paperwork has been completed by Antonieta Iba, RN

## 2019-08-27 NOTE — Telephone Encounter (Signed)
To whom it may concern,   Carlos Thomas is stable with regards to his cardiac condition, atrial fibrillation, with normal pump function on echocardiogram and normal rhythm on most recent EKG. No symptoms reported at last visit. He may continue to fly from a cardiac perspective.   Candee Furbish, MD

## 2019-09-02 DIAGNOSIS — G4733 Obstructive sleep apnea (adult) (pediatric): Secondary | ICD-10-CM | POA: Diagnosis not present

## 2019-09-28 ENCOUNTER — Other Ambulatory Visit: Payer: Self-pay

## 2019-09-28 ENCOUNTER — Ambulatory Visit (HOSPITAL_COMMUNITY)
Admission: RE | Admit: 2019-09-28 | Discharge: 2019-09-28 | Disposition: A | Discharge status: Home / Self Care | Payer: Medicare PPO | Source: Ambulatory Visit | Attending: Nurse Practitioner | Admitting: Nurse Practitioner

## 2019-09-28 ENCOUNTER — Encounter (HOSPITAL_COMMUNITY): Payer: Self-pay | Admitting: Nurse Practitioner

## 2019-09-28 VITALS — BP 138/58 | HR 46 | Ht 71.0 in | Wt 229.2 lb

## 2019-09-28 DIAGNOSIS — E669 Obesity, unspecified: Secondary | ICD-10-CM | POA: Insufficient documentation

## 2019-09-28 DIAGNOSIS — D6869 Other thrombophilia: Secondary | ICD-10-CM | POA: Diagnosis not present

## 2019-09-28 DIAGNOSIS — K219 Gastro-esophageal reflux disease without esophagitis: Secondary | ICD-10-CM | POA: Insufficient documentation

## 2019-09-28 DIAGNOSIS — G4733 Obstructive sleep apnea (adult) (pediatric): Secondary | ICD-10-CM | POA: Insufficient documentation

## 2019-09-28 DIAGNOSIS — I10 Essential (primary) hypertension: Secondary | ICD-10-CM | POA: Insufficient documentation

## 2019-09-28 DIAGNOSIS — I480 Paroxysmal atrial fibrillation: Secondary | ICD-10-CM | POA: Insufficient documentation

## 2019-09-28 DIAGNOSIS — Z6830 Body mass index (BMI) 30.0-30.9, adult: Secondary | ICD-10-CM | POA: Diagnosis not present

## 2019-09-28 DIAGNOSIS — Z79899 Other long term (current) drug therapy: Secondary | ICD-10-CM | POA: Diagnosis not present

## 2019-09-28 MED ORDER — DILTIAZEM HCL ER COATED BEADS 120 MG PO CP24: 120.0000 mg | ORAL_CAPSULE | Freq: Every day | ORAL | 0 refills | Status: DC

## 2019-09-28 NOTE — Progress Notes (Signed)
Primary Care Physician: Shirline Frees, MD Referring Physician: [facility] ER f/u EP: Dr. Loney Hering Carlos Thomas is a 69 y.o. male with a h/o afib s/p w ablations, 2010 and 2013 that intermittently requires cardioversion, on flecainide. Pt was involved in a traffic situation that escalated his HR on Friday and immediately felt his heart go into afib. He thought he took an extra flecainide but by mistake, he took a nausea pill. He went to the  ER on Saturday but the ER doc did not want to cardiovert as he was rate controlled. He returned on Sunday to the ER with afib around 120 bpm and received successful  cardioversion.  On last visit in April 2019, he continued to maintain S brady at 50 bpm. Continues on xarelto 20 mg daily. He was given an extra 120 mg diltiazem to add to his 240 mg daily Cardizem in the ER.  F/u afib clinic, 12/6. He is in SR and has not had any afib but has neuropathy and his neurologist said that flecainide may be worsening his symptoms. He says that he has had neuropathy  for awhile and the symptoms are stable. He does not want to stop flecainide but wanted to know our experience with this.   F/u in afib clinic, 09/28/19. I have not seen pt since 2019. He decided to stay on the flecainide after his last visit as  we discussed staying on flecainide despite it may be worsening his peripheral neuropathy as mentioned by his neurologist. He states that it has stayed about the same and he prefers to continue its use. He has not noted any afib. EKG shows sinus brady at 46 bpm, LAFB.   Today, he denies symptoms of palpitations, chest pain, shortness of breath, orthopnea, PND, lower extremity edema, dizziness, presyncope, syncope, or neurologic sequela. The patient is tolerating medications without difficulties and is otherwise without complaint today.   Past Medical History:  Diagnosis Date  . Anticoagulant long-term use    xarelto  . Balanitis   . GERD (gastroesophageal reflux  disease)   . History of adenomatous polyp of colon    2006  tubular adenoma's and hyperplastic polyp's;   2011  tubular adenoma  . History of atrial flutter    s/p ablation atrial flutter/atrial fib in 2010  . History of MRSA infection    1990's  infected wound near surgerical area   . History of repair of hiatal hernia   . HTN (hypertension)   . Obesity (BMI 30-39.9)   . OSA on CPAP    Mild OSA  per study 05-07-2012-- w AHI 6.57/hr now on 13cm H2O  . PAF (paroxysmal atrial fibrillation) [facility]) primary cardiologist-  dr Marlou Porch   ep cardiologist-  dr Rayann Heman---  s/p  ablation afib and aflutter 2010  and  ablation afib/svt  2013  . Peripheral neuropathy, hereditary/idiopathic    bilateral feet---  neurologist- dr patel  . 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 S/P radiofrequency ablation operation for arrhythmia    01-09-2009  cardiac ablation afib/flutter;   09-13-2011  ablation afib/svt and in ther right atrium  . Wears glasses    Past Surgical History:  Procedure Laterality Date  . ATRIAL FIBRILLATION ABLATION N/A 09/13/2011   Procedure: ATRIAL FIBRILLATION ABLATION;  Surgeon: Thompson Grayer, MD;  Location: [facility] CATH LAB;  Service: Cardiovascular;  Laterality: N/A;  . CARDIAC ELECTROPHYSIOLOGY MAPPING AND ABLATION  [phone]   dr Rayann Heman  successful ablation atrial fibrillation /  atrial flutter  . CARDIAC ELECTROPHYSIOLOGY MAPPING AND ABLATION  09-13-2011   dr Rayann Heman   afib mapping/ ablation and  mapping/ablation in the right atrium  . CARDIOVASCULAR STRESS TEST  09/2006   per dr Marlou Porch note nuclear study normal w/ no ischemia  . CARDIOVERSION N/A 09/21/2015   Procedure: CARDIOVERSION;  Surgeon: Lelon Perla, MD;  Location: [facility] ENDOSCOPY;  Service: Cardiovascular;  Laterality: N/A;  . CIRCUMCISION N/A 06/27/2016   Procedure: CIRCUMCISION ADULT;  Surgeon: Carolan Clines, MD;  Location: [facility];  Service: Urology;  Laterality: N/A;   . COLONOSCOPY  last one 02/  2011  . DIRECT CURRENT CARDIOVERSION  02/02/2010   dr Leonia Reeves  . LAPAROSCOPIC CHOLECYSTECTOMY  1990's  . OPEN HIATAL HERNIA REPAIR  1970's  . TEE WITHOUT CARDIOVERSION  09/12/2011   Procedure: TRANSESOPHAGEAL ECHOCARDIOGRAM (TEE);  Surgeon: Jettie Booze, MD;  Location: [facility] ENDOSCOPY;  Service: Cardiovascular;  Laterality: N/A;  trace AR, no LA/LAA thrombus, normal LVfunction, moderate descending aortic atherosclerosis  . TEE WITHOUT CARDIOVERSION N/A 09/21/2015   Procedure: TRANSESOPHAGEAL ECHOCARDIOGRAM (TEE);  Surgeon: Lelon Perla, MD;  Location: [facility] ENDOSCOPY;  Service: Cardiovascular;  Laterality: N/A; normal LV systolic funciton,  moderate LAE,  no LAA thrombus,  trace MR and TR  . TRANSTHORACIC ECHOCARDIOGRAM  06/03/2014   ef 55-60%/  trivial MR and TR    Current Outpatient Medications  Medication Sig Dispense Refill  . diltiazem (CARDIZEM CD) 240 MG 24 hr capsule TAKE 1 CAPSULE BY MOUTH EVERY DAY 90 capsule 2  . diltiazem (CARTIA XT) 120 MG 24 hr capsule Take 1 capsule (120 mg total) by mouth at bedtime. 90 capsule 0  . flecainide (TAMBOCOR) 100 MG tablet TAKE 1 TABLET BY MOUTH TWICE DAILY 60 tablet 11  . omeprazole (PRILOSEC) 20 MG capsule Take 20 mg by mouth every morning.     . tamsulosin (FLOMAX) 0.4 MG CAPS capsule Take 0.4 mg by mouth daily.    Carlos Thomas 20 MG TABS tablet TAKE 1 TABLET BY MOUTH DAILY WITH SUPPER 30 tablet 5   No current facility-administered medications for this encounter.    No Known Allergies  Social History   Socioeconomic History  . Marital status: Divorced    Spouse name: Not on file  . Number of children: Not on file  . Years of education: Not on file  . Highest education level: Not on file  Occupational History  . Not on file  Tobacco Use  . Smoking status: Never Smoker  . Smokeless tobacco: Never Used  Substance and Sexual Activity  . Alcohol use: No    Alcohol/week: 1.0 standard drinks    Types: 1  Cans of beer per week    Comment: rare  . Drug use: No  . Sexual activity: Not on file  Other Topics Concern  . 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  Education: college.   Social Determinants of Health   Financial Resource Strain:   . Difficulty of Paying Living Expenses:   Food Insecurity:   . Worried About Charity fundraiser in the Last Year:   . Arboriculturist in the Last Year:   Transportation Needs:   . Film/video editor (Medical):   Marland Kitchen Lack of Transportation (Non-Medical):   Physical Activity:   . Days  of Exercise per Week:   . Minutes of Exercise per Session:   Stress:   . Feeling of Stress :   Social Connections:   . Frequency of Communication with Friends and Family:   . Frequency of Social Gatherings with Friends and Family:   . Attends Religious Services:   . Active Member of Clubs or Organizations:   . Attends Archivist Meetings:   Marland Kitchen Marital Status:   Intimate Partner Violence:   . Fear of Current or Ex-Partner:   . Emotionally Abused:   Marland Kitchen Physically Abused:   . Sexually Abused:     Family History  Problem Relation Age of Onset  . CAD Father   . Heart attack Father   . Atrial fibrillation Neg Hx   . Anesthesia problems Neg Hx     ROS- All systems are reviewed and negative except as per the HPI above  Physical Exam: Vitals:   09/28/19 1350  BP: (!) 138/58  Pulse: (!) 46  Weight: 104 kg  Height: 5\' 11"  (1.803 m)   Wt Readings from Last 3 Encounters:  09/28/19 104 kg  07/21/19 105.2 kg  05/10/19 105.2 kg    Labs: Lab Results  Component Value Date   NA 140 11/05/2018   K 4.3 11/05/2018   CL 104 11/05/2018   CO2 22 11/05/2018   GLUCOSE 113 (H) 11/05/2018   BUN 16 11/05/2018   CREATININE 0.98 11/05/2018   CALCIUM 9.0 11/05/2018   MG 2.0 05/24/2014   Lab Results  Component Value Date   INR 1.26 10/20/2016    No results found for: CHOL, HDL, LDLCALC, TRIG   GEN- The patient is well appearing, alert and oriented x 3 today.   Head- normocephalic, atraumatic Eyes-  Sclera clear, conjunctiva pink Ears- hearing intact Oropharynx- clear Neck- supple, no JVP Lymph- no cervical lymphadenopathy Lungs- Clear to ausculation bilaterally, normal work of breathing Heart- Regular rate and rhythm, no murmurs, rubs or gallops, PMI not laterally displaced GI- soft, NT, ND, + BS Extremities- no clubbing, cyanosis, or edema MS- no significant deformity or atrophy Skin- no rash or lesion Psych- euthymic mood, full affect Neuro- strength and sensation are intact  EKG- Sinus brady at 46 ms, pr int 182 ms, qrs int 96 ms, qtc 178 ms  Epic records reviewed   Assessment and Plan: 1. Paroxysmal afib Maintaining  SR  Discussed again  re neuropathy and flecainide He does not want to change off flecainide as he has done so well. Continue flecainide at 100 mg bid Continue diltiazem but split dose to 240 in am and 120 in the pm as he often will have a mid morning slump in energy Continue xarelto 20 mg daily for a chadsvasc score of at least  2  continue cpap  Will see back in one year as he sees Dr. Marlou Porch in 6 months   Geroge Baseman. [place], [facility] [address] [place], [place] [address] [phone]

## 2019-09-28 NOTE — Patient Instructions (Signed)
Move your cardizem 120mg  to bedtime starting tomorrow. Continue cardizem 240mg  in the morning.

## 2019-10-03 DIAGNOSIS — G4733 Obstructive sleep apnea (adult) (pediatric): Secondary | ICD-10-CM | POA: Diagnosis not present

## 2019-10-07 ENCOUNTER — Telehealth: Payer: Self-pay | Admitting: Cardiology

## 2019-10-07 DIAGNOSIS — I480 Paroxysmal atrial fibrillation: Secondary | ICD-10-CM

## 2019-10-07 NOTE — Telephone Encounter (Signed)
Patient calling stating in order to get his class 3 medical certification from the [facility] they want a current history and current exam notes, and recent ECG and echo results. They also need a current status report on his xarelto, and his current condition treated with flecainide. They are also requesting he have a Holter monitor. He would like a call back from a nurse to go over everything.

## 2019-10-08 NOTE — Telephone Encounter (Signed)
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  He does not feel pt needs another 2 D eCHO at this time and we will include the one from 2017 in his paperwork.  Dr Marlou Porch will complete a letter for pt with the needed information.

## 2019-10-08 NOTE — Telephone Encounter (Signed)
Spoke with pt who is aware I have printed the information from the [facility] he attached in his MyChart message.  He is aware I will review with Dr Marlou Porch to determine if he needs to see him for the "current history and clinical exam" they are requiring.  Also to be determined  1) if updated echo is needed since the last one was a TEE 08/2015  2) order for 24 hr holter.  Pt is aware he will be contacted to be scheduled as necessary.  He is also aware once all testing has been completed and results are available he will be contacted to pick up information or it can be mailed per his preference.  He was grateful for the call and assistance.  He will await further communication and instructions from this office.

## 2019-10-12 ENCOUNTER — Ambulatory Visit (INDEPENDENT_AMBULATORY_CARE_PROVIDER_SITE_OTHER): Payer: Medicare PPO

## 2019-10-12 ENCOUNTER — Other Ambulatory Visit: Payer: Self-pay

## 2019-10-12 DIAGNOSIS — I480 Paroxysmal atrial fibrillation: Secondary | ICD-10-CM

## 2019-10-14 NOTE — Telephone Encounter (Signed)
Pt had 24 hr holter placed 10/12/2019.

## 2019-10-21 IMAGING — MR MR ABDOMEN WO/W CM
11 of 17 series · 25 of 48 positions shown · IV contrast (20ml Multihance)
Comparison: CT 08/31/2017

CLINICAL DATA: Indeterminate renal lesion on CT. MRI recommended
for further evaluation. Incidental finding patient with colitis.

EXAM:
MRI ABDOMEN WITHOUT AND WITH CONTRAST
TECHNIQUE: Multiplanar multisequence MR imaging of the abdomen was performed
both before and after the administration of intravenous contrast.
CONTRAST:  20mL MULTIHANCE GADOBENATE DIMEGLUMINE 529 MG/ML IV SOLN

[Series 3: T2 · coronal · 5.0mm · 1.64mm/px · 1 of 48 slices shown (1 of 3)]
[im 1/48]
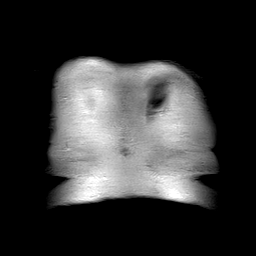

[Series 4: axial tru fisp · axial · 5.0mm · 1.64mm/px · 1 of 47 slices shown]
[im 1/47]
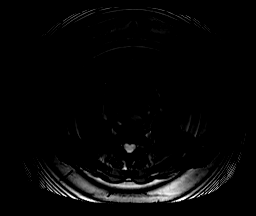

[Series 5: T2 · axial · 6.5mm · 0.82mm/px · 1 of 36 slices shown (2 of 3)]
[im 1/36]
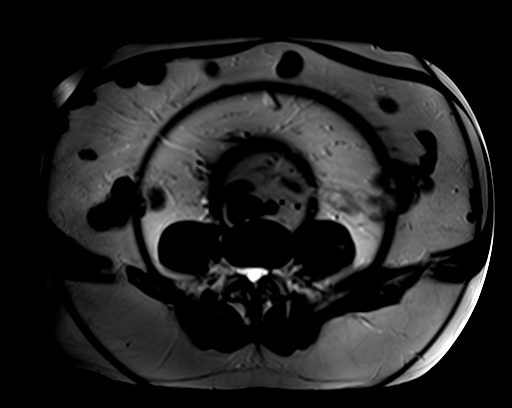

[Series 6: [id number] · axial · 6.0mm · 2.19mm/px · z∈[-146,+127]mm · 2 of 108 slices shown]
[im 1/108]
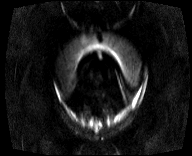
[im 108/108]
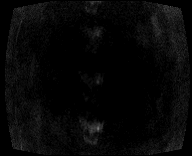

[Series 7: [id number]_adc · axial · 6.0mm · 2.19mm/px · 1 of 36 slices shown]
[im 1/36]
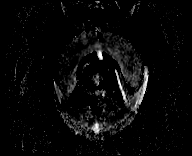

[Series 8: T2 · axial · 5.0mm · 1.56mm/px · 1 of 41 slices shown (3 of 3)]
[im 1/41]
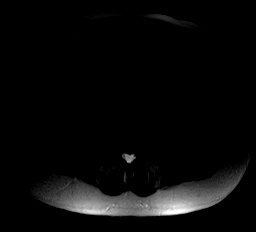

[Series 9: axial in out · axial · 6.0mm · 0.82mm/px · z∈[-180,+110]mm · 2 of 86 slices shown]
[im 1/86]
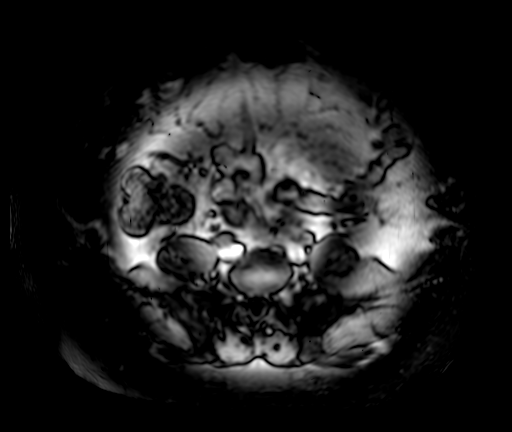
[im 86/86]
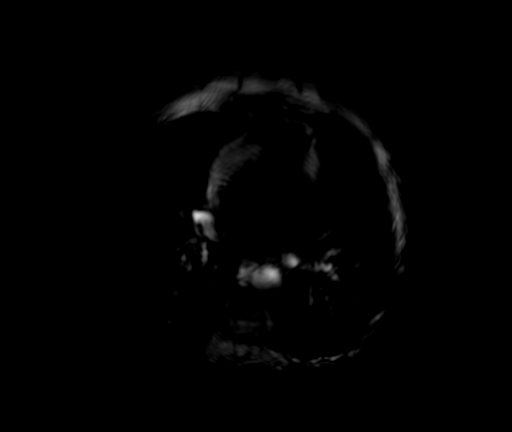

[Series 10: T1 dynamic · axial · non-contrast · 2.2mm · 0.82mm/px · z∈[-157,+87]mm · 4 of 112 slices shown]
[im 1/112]
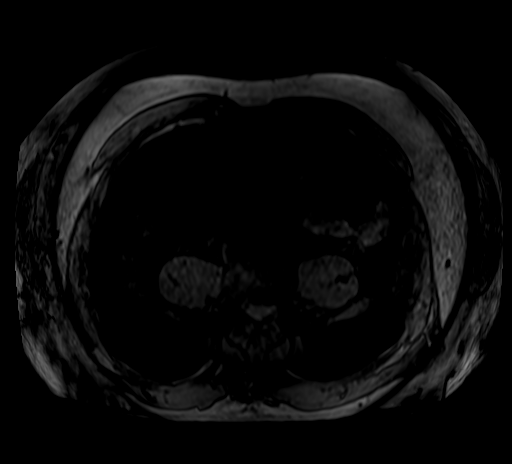
[im 38/112]
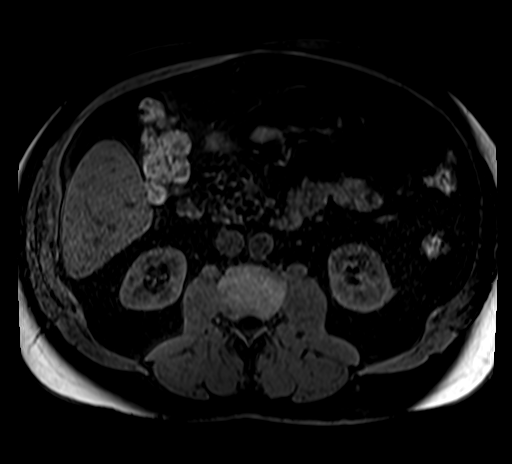
[im 75/112]
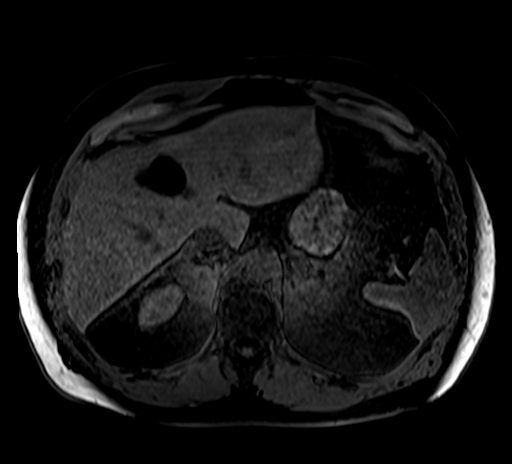
[im 112/112]
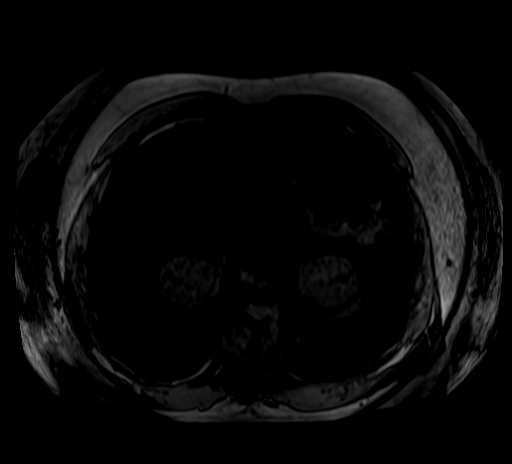

[Series 11: post 25 sec · axial · 2.2mm · 0.82mm/px · z∈[-157,+87]mm · 4 of 112 slices shown]
[im 1/112]
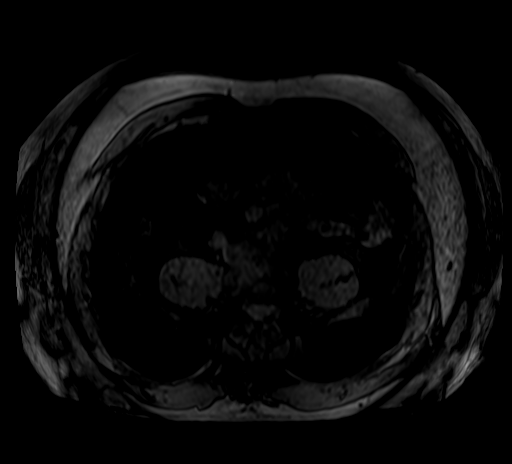
[im 38/112]
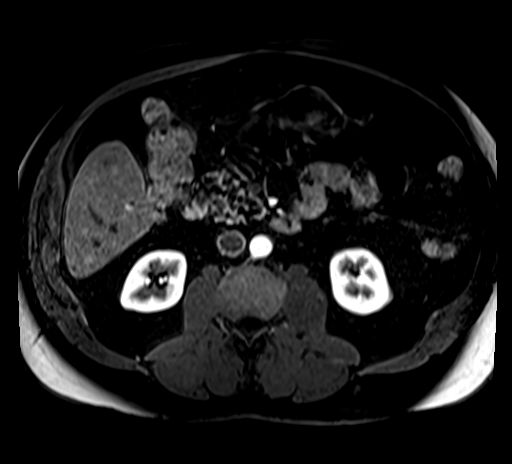
[im 75/112]
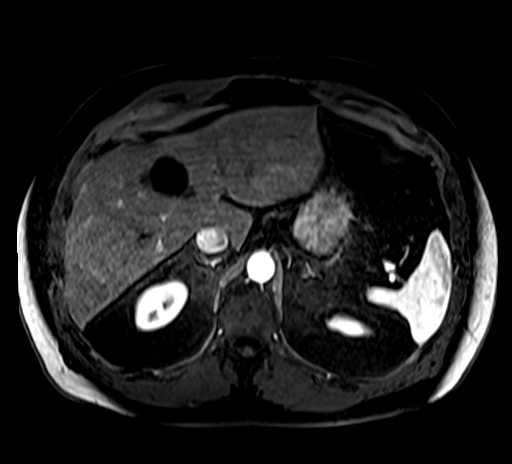
[im 112/112]
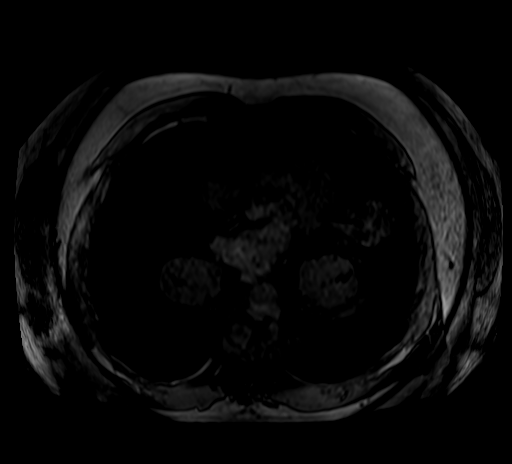

[Series 12: post 25 sec_sub · axial · 2.2mm · 0.82mm/px · z∈[-157,+87]mm · 4 of 112 slices shown]
[im 1/112]
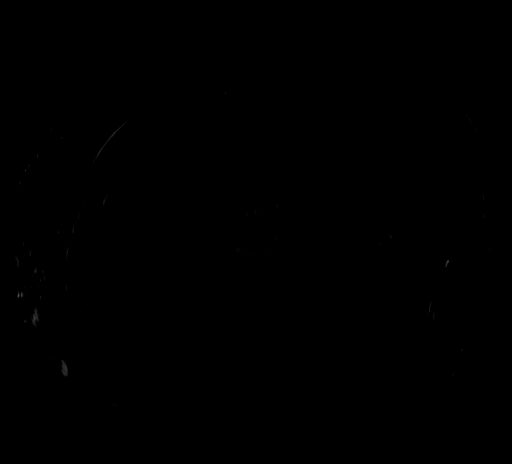
[im 38/112]
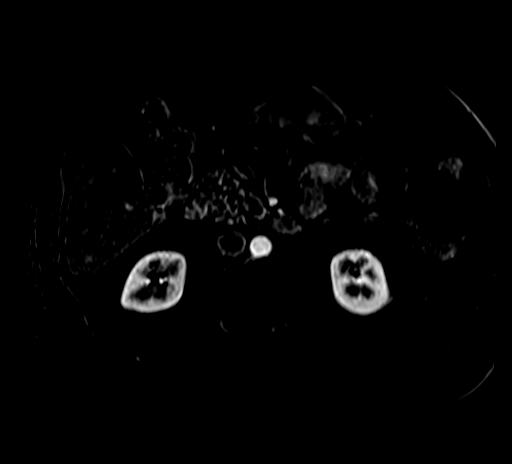
[im 75/112]
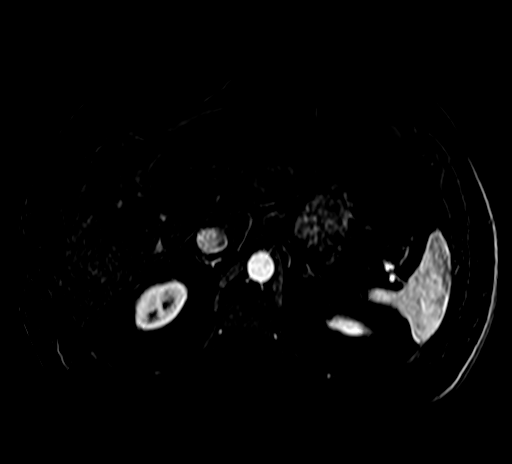
[im 112/112]
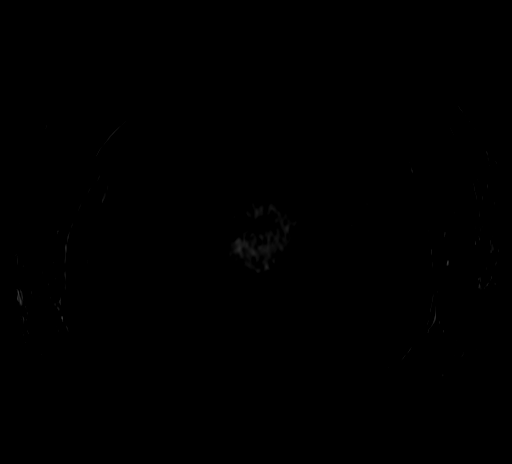

[Series 13: post 45 sec · axial · 2.2mm · 0.82mm/px · z∈[-157,+87]mm · 4 of 112 slices shown]
[im 1/112]
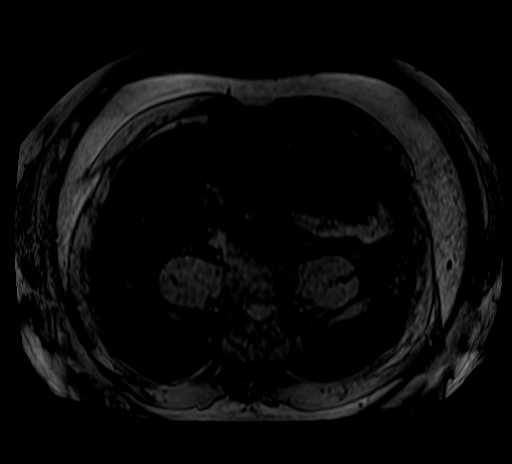
[im 38/112]
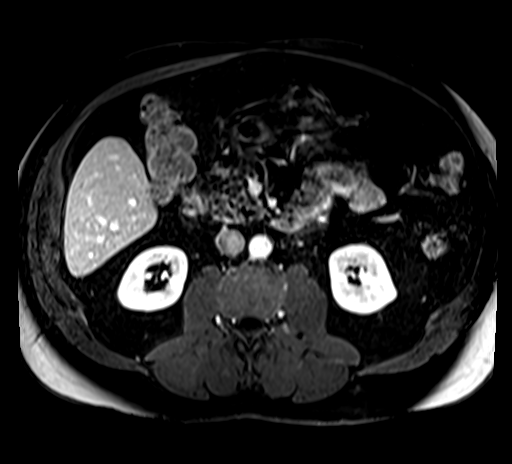
[im 75/112]
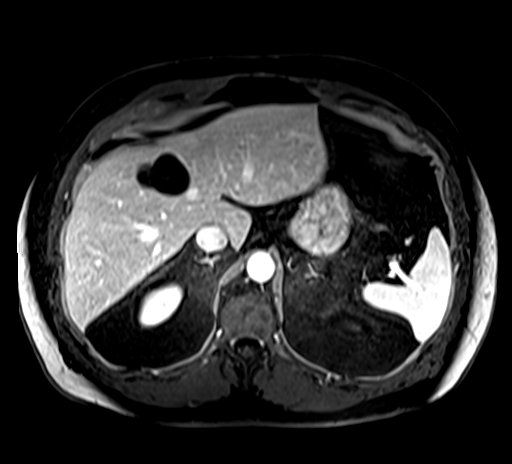
[im 112/112]
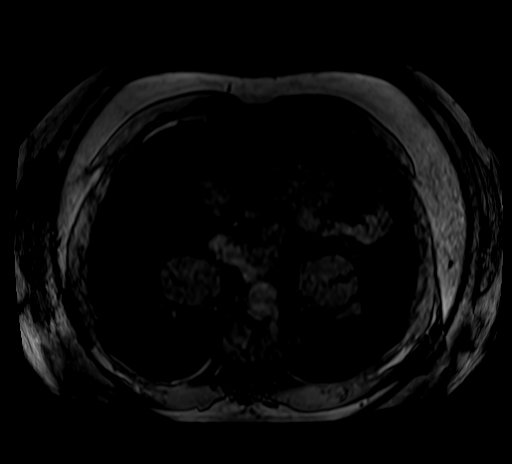

[25 of 48 positions shown; findings below may reference images not displayed]

FINDINGS: Lower chest:  Lung bases are clear.

Hepatobiliary: Multiple benign nonenhancing cysts within the kidneys
liver. No biliary duct dilatation. Postcholecystectomy. Common bile
duct is normal caliber. There is loss signal intensity within liver
parenchyma on opposed phase imaging (series 9) consistent with
steatosis.

Pancreas: Fatty replacement through the pancreatic head no
pancreatic duct dilatation. No inflammation.

Spleen: Normal spleen

Adrenals/urinary tract: Adrenal glands are normal. Within the upper
pole of the RIGHT kidney round well-circumscribed lesion measures
2.6 x 2.6 cm on image [DATE] and is relatively isointense to renal
parenchyma on T2 weighted imaging. This lesion is slightly
hyperintense to renal parenchyma on T1 weighted imaging. On the
post-contrast T1 weighted imaging, lesion demonstrates no measurable
or visual post-contrast enhancement (series [DATE], and 13).

Within the LEFT kidney, there is atypical nonenhancing simple cyst

Stomach/Bowel: Stomach and limited of the small bowel is
unremarkable

Vascular/Lymphatic: Abdominal aortic normal caliber. No
retroperitoneal periportal lymphadenopathy.

Musculoskeletal: No aggressive osseous lesion
IMPRESSION: 1. Benign Bosniak 2 renal cyst of the RIGHT kidney corresponds to
indeterminate lesion on comparison CT.
2. Bosniak 1 renal cyst of the LEFT kidney.
3. Multiple benign hepatic cyst.
4. Mild hepatic steatosis.

## 2019-10-22 ENCOUNTER — Telehealth: Payer: Self-pay | Admitting: *Deleted

## 2019-10-22 NOTE — Telephone Encounter (Signed)
Pt notified. He is asking if [place] paperwork can be completed and if he needs an office visit with Dr Marlou Porch prior to paperwork being completed.  Patient reports he let [place] paperwork lapse recently but he would like Dr Marlou Porch know from now on he will make sure he updates paperwork yearly.

## 2019-10-22 NOTE — Telephone Encounter (Signed)
-----   Message from Jerline Pain, MD sent at 10/22/2019  1:45 PM EDT -----  Sinus bradycardia, average HR 48 bpm, minimum 39 bpm, maximum 81 bpm  Rare SVT (atrial tachycardia), 2 episodes, 14 beats duration.  Occasional PAC's (premature atrial contractions)  No pauses, no atrial fibrillation.  DC Diltiazem 240mg  and continue Diltiazem 120 mg once a day.  Candee Furbish, MD

## 2019-10-25 ENCOUNTER — Other Ambulatory Visit (HOSPITAL_COMMUNITY): Payer: Self-pay | Admitting: *Deleted

## 2019-10-25 MED ORDER — DILTIAZEM HCL ER COATED BEADS 120 MG PO CP24: 120.0000 mg | ORAL_CAPSULE | Freq: Every day | ORAL | 2 refills | Status: DC

## 2019-10-26 NOTE — Telephone Encounter (Signed)
Spoke with patient and scheduled appt for Thursday with Dr Marlou Porch to complete FAA paperwork/documentation.

## 2019-10-26 NOTE — Telephone Encounter (Signed)
Having Dr Marlou Porch to review information.  See next telephone note for further information.

## 2019-10-28 ENCOUNTER — Ambulatory Visit: Payer: Medicare PPO | Admitting: Cardiology

## 2019-10-28 ENCOUNTER — Other Ambulatory Visit: Payer: Self-pay

## 2019-10-28 ENCOUNTER — Encounter: Payer: Self-pay | Admitting: Cardiology

## 2019-10-28 VITALS — BP 134/70 | HR 64 | Ht 71.0 in | Wt 228.0 lb

## 2019-10-28 DIAGNOSIS — G4733 Obstructive sleep apnea (adult) (pediatric): Secondary | ICD-10-CM

## 2019-10-28 DIAGNOSIS — I480 Paroxysmal atrial fibrillation: Secondary | ICD-10-CM | POA: Diagnosis not present

## 2019-10-28 DIAGNOSIS — Z79899 Other long term (current) drug therapy: Secondary | ICD-10-CM | POA: Diagnosis not present

## 2019-10-28 LAB — CBC
Hematocrit: 45.9 % (ref 37.5–51.0)
Hemoglobin: 15.7 g/dL (ref 13.0–17.7)
MCH: 32.8 pg (ref 26.6–33.0)
MCHC: 34.2 g/dL (ref 31.5–35.7)
MCV: 96 fL (ref 79–97)
Platelets: 146 10*3/uL — ABNORMAL LOW (ref 150–450)
RBC: 4.78 x10E6/uL (ref 4.14–5.80)
RDW: 12.1 % (ref 11.6–15.4)
WBC: 4.4 10*3/uL (ref 3.4–10.8)

## 2019-10-28 LAB — BASIC METABOLIC PANEL
BUN/Creatinine Ratio: 19 (ref 10–24)
BUN: 18 mg/dL (ref 8–27)
CO2: 23 mmol/L (ref 20–29)
Calcium: 9 mg/dL (ref 8.6–10.2)
Chloride: 104 mmol/L (ref 96–106)
Creatinine, Ser: 0.94 mg/dL (ref 0.76–1.27)
GFR calc Af Amer: 95 mL/min/{1.73_m2} (ref >59)
GFR calc non Af Amer: 82 mL/min/{1.73_m2} (ref >59)
Glucose: 129 mg/dL — ABNORMAL HIGH (ref 65–99)
Potassium: 4.2 mmol/L (ref 3.5–5.2)
Sodium: 142 mmol/L (ref 134–144)

## 2019-10-28 NOTE — Patient Instructions (Signed)
Medication Instructions:  Your physician recommends that you continue on your current medications as directed. Please refer to the Current Medication list given to you today.  *If you need a refill on your cardiac medications before your next appointment, please call your pharmacy*   Lab Work: BMET AND CBC TODAY  If you have labs (blood work) drawn today and your tests are completely normal, you will receive your results only by: Marland Kitchen MyChart Message (if you have MyChart) OR . A paper copy in the mail If you have any lab test that is abnormal or we need to change your treatment, we will call you to review the results.   Testing/Procedures: None    Follow-Up: At [facility], you and your health needs are our priority.  As part of our continuing mission to provide you with exceptional heart care, we have created designated Provider Care Teams.  These Care Teams include your primary Cardiologist (physician) and Advanced Practice Providers (APPs -  Physician Assistants and Nurse Practitioners) who all work together to provide you with the care you need, when you need it.  We recommend signing up for the patient portal called "MyChart".  Sign up information is provided on this After Visit Summary.  MyChart is used to connect with patients for Virtual Visits (Telemedicine).  Patients are able to view lab/test results, encounter notes, upcoming appointments, etc.  Non-urgent messages can be sent to your provider as well.   To learn more about what you can do with MyChart, go to [email or web address].    Your next appointment:   6 month(s)  The format for your next appointment:   In Person  Provider:   Candee Furbish, MD   Other Instructions

## 2019-10-28 NOTE — Addendum Note (Signed)
Addended by: Eulis Foster on: 10/28/2019 09:33 AM   Modules accepted: Orders

## 2019-10-28 NOTE — Progress Notes (Signed)
Cardiology Office Note:    Date:  10/28/2019   ID:  TILAK BRIETZKE, DOB [birth year], MRN [id number]  PCP:  Shirline Frees, MD  Cardiologist:  Candee Furbish, MD  Electrophysiologist:  None   Referring MD: Shirline Frees, MD     History of Present Illness:    Carlos Thomas is a 69 y.o. male here for the follow-up of prior atrial fibrillation status post two separate atrial fibrillation ablations in 2010 and 2013 on chronic flecainide for continued rhythm control.  He has been compliant with his medications. Taking Xarelto for anticoagulation without any bleeding.  He has had follow-up in the past with the atrial fibrillation clinic.  At one point, he saw a neurologist for neuropathy and the neurologist suggested that the flecanide may be a cause for the neuropathic symptoms. At that point, he decided to continue with the flecanide in order to give himself extra assurance that he would not go into atrial fibrillation again.  Prior EKG showed sinus bradycardia 46 with left anterior fascicular block which was asymptomatic. He has not had any syncopal episodes no fevers chills nausea vomiting syncope bleeding.  Overall has been doing very well without any anginal symptoms.  Enjoying flying.  Please see below for details.  Holter monitor 10/12/2019:  Sinus bradycardia, average HR 48 bpm, minimum 39 bpm, maximum 81 bpm  Rare SVT (atrial tachycardia), 2 episodes, 14 beats duration.  Occasional PAC's (premature atrial contractions)  No pauses, no atrial fibrillation.   Transesophageal echocardiogram 09/21/2015:  - Left ventricle: Systolic function was normal. The estimated  ejection fraction was in the range of 55% to 60%. Wall motion was  normal; there were no regional wall motion abnormalities.  - Left atrium: The atrium was moderately dilated. No evidence of  thrombus in the atrial cavity or appendage.  - Atrial septum: No defect or patent foramen ovale was identified.    Impressions:   - Normal LV systolic function; moderate LAE; no LAA thrombus; trace  MR and TR.   Lower extremity Doppler negative for DVT 07/09/2019    Past Medical History:  Diagnosis Date  . Anticoagulant long-term use    xarelto  . Balanitis   . GERD (gastroesophageal reflux disease)   . History of adenomatous polyp of colon    2006  tubular adenoma's and hyperplastic polyp's;   2011  tubular adenoma  . History of atrial flutter    s/p ablation atrial flutter/atrial fib in 2010  . History of MRSA infection    1990's  infected wound near surgerical area   . History of repair of hiatal hernia   . HTN (hypertension)   . Obesity (BMI 30-39.9)   . OSA on CPAP    Mild OSA  per study 05-07-2012-- w AHI 6.57/hr now on 13cm H2O  . PAF (paroxysmal atrial fibrillation) [facility]) primary cardiologist-  dr Marlou Porch   ep cardiologist-  dr Rayann Heman---  s/p  ablation afib and aflutter 2010  and  ablation afib/svt  2013  . Peripheral neuropathy, hereditary/idiopathic    bilateral feet---  neurologist- dr patel  . 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 S/P radiofrequency ablation operation for arrhythmia    01-09-2009  cardiac ablation afib/flutter;   09-13-2011  ablation afib/svt and in ther right atrium  . Wears glasses     Past Surgical History:  Procedure Laterality Date  . ATRIAL FIBRILLATION ABLATION N/A 09/13/2011   Procedure: ATRIAL FIBRILLATION ABLATION;  Surgeon:  Thompson Grayer, MD;  Location: [facility] CATH LAB;  Service: Cardiovascular;  Laterality: N/A;  . CARDIAC ELECTROPHYSIOLOGY MAPPING AND ABLATION  [phone]   dr allred   successful ablation atrial fibrillation /  atrial flutter  . CARDIAC ELECTROPHYSIOLOGY MAPPING AND ABLATION  09-13-2011   dr Rayann Heman   afib mapping/ ablation and  mapping/ablation in the right atrium  . CARDIOVASCULAR STRESS TEST  09/2006   per dr Marlou Porch note nuclear study normal w/ no ischemia  . CARDIOVERSION N/A 09/21/2015    Procedure: CARDIOVERSION;  Surgeon: Lelon Perla, MD;  Location: [facility] ENDOSCOPY;  Service: Cardiovascular;  Laterality: N/A;  . CIRCUMCISION N/A 06/27/2016   Procedure: CIRCUMCISION ADULT;  Surgeon: Carolan Clines, MD;  Location: [facility];  Service: Urology;  Laterality: N/A;  . COLONOSCOPY  last one 02/  2011  . DIRECT CURRENT CARDIOVERSION  02/02/2010   dr Leonia Reeves  . LAPAROSCOPIC CHOLECYSTECTOMY  1990's  . OPEN HIATAL HERNIA REPAIR  1970's  . TEE WITHOUT CARDIOVERSION  09/12/2011   Procedure: TRANSESOPHAGEAL ECHOCARDIOGRAM (TEE);  Surgeon: Jettie Booze, MD;  Location: [facility] ENDOSCOPY;  Service: Cardiovascular;  Laterality: N/A;  trace AR, no LA/LAA thrombus, normal LVfunction, moderate descending aortic atherosclerosis  . TEE WITHOUT CARDIOVERSION N/A 09/21/2015   Procedure: TRANSESOPHAGEAL ECHOCARDIOGRAM (TEE);  Surgeon: Lelon Perla, MD;  Location: [facility] ENDOSCOPY;  Service: Cardiovascular;  Laterality: N/A; normal LV systolic funciton,  moderate LAE,  no LAA thrombus,  trace MR and TR  . TRANSTHORACIC ECHOCARDIOGRAM  06/03/2014   ef 55-60%/  trivial MR and TR    Current Medications: Current Meds  Medication Sig  . diltiazem (CARTIA XT) 120 MG 24 hr capsule Take 1 capsule (120 mg total) by mouth at bedtime.  . flecainide (TAMBOCOR) 100 MG tablet TAKE 1 TABLET BY MOUTH TWICE DAILY  . omeprazole (PRILOSEC) 20 MG capsule Take 20 mg by mouth every morning.   . tamsulosin (FLOMAX) 0.4 MG CAPS capsule Take 0.4 mg by mouth daily.  Alveda Reasons 20 MG TABS tablet TAKE 1 TABLET BY MOUTH DAILY WITH SUPPER     Allergies:   Patient has no known allergies.   Social History   Socioeconomic History  . Marital status: Divorced    Spouse name: Not on file  . Number of children: Not on file  . Years of education: Not on file  . Highest education level: Not on file  Occupational History  . Not on file  Tobacco Use  . Smoking status: Never Smoker  . Smokeless tobacco:  Never Used  Substance and Sexual Activity  . Alcohol use: No    Alcohol/week: 1.0 standard drinks    Types: 1 Cans of beer per week    Comment: rare  . Drug use: No  . Sexual activity: Not on file  Other Topics Concern  . 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  Education: college.   Social Determinants of Health   Financial Resource Strain:   . Difficulty of Paying Living Expenses:   Food Insecurity:   . Worried About Charity fundraiser in the Last Year:   . Arboriculturist in the Last Year:   Transportation Needs:   . Film/video editor (Medical):   Marland Kitchen Lack of Transportation (Non-Medical):   Physical Activity:   . Days of Exercise per Week:   .  Minutes of Exercise per Session:   Stress:   . Feeling of Stress :   Social Connections:   . Frequency of Communication with Friends and Family:   . Frequency of Social Gatherings with Friends and Family:   . Attends Religious Services:   . Active Member of Clubs or Organizations:   . Attends Archivist Meetings:   Marland Kitchen Marital Status:      Family History: The patient's family history includes CAD in his father; Heart attack in his father. There is no history of Atrial fibrillation or Anesthesia problems.  ROS:   Please see the history of present illness.     All other systems reviewed and are negative.  EKGs/Labs/Other Studies Reviewed:    The following studies were reviewed today: As above  EKG:  prior Sinus brady at 46 ms, pr int 182 ms, qrs int 96 ms, qtc 178 ms   Recent Labs: 11/05/2018: BUN 16; Creatinine, Ser 0.98; Hemoglobin 14.3; Platelets 143; Potassium 4.3; Sodium 140  Recent Lipid Panel No results found for: CHOL, TRIG, HDL, CHOLHDL, VLDL, LDLCALC, LDLDIRECT  Physical Exam:    VS:  BP 134/70   Pulse 64   Ht 5\' 11"  (1.803 m)   Wt 228 lb (103.4 kg)   SpO2 98%   BMI 31.80 kg/m     Wt  Readings from Last 3 Encounters:  10/28/19 228 lb (103.4 kg)  09/28/19 229 lb 3.2 oz (104 kg)  07/21/19 232 lb (105.2 kg)     GEN:  Well nourished, well developed in no acute distress HEENT: Normal NECK: No JVD; No carotid bruits LYMPHATICS: No lymphadenopathy CARDIAC: RRR, no murmurs, rubs, gallops RESPIRATORY:  Clear to auscultation without rales, wheezing or rhonchi  ABDOMEN: Soft, non-tender, non-distended MUSCULOSKELETAL:  No edema; No deformity  SKIN: Warm and dry NEUROLOGIC:  Alert and oriented x 3 PSYCHIATRIC:  Normal affect   ASSESSMENT:    1. PAF (paroxysmal atrial fibrillation) ([place])   2. OSA (obstructive sleep apnea)   3. Medication management    PLAN:    In order of problems listed above:  Paroxysmal atrial fibrillation -Doing very well on flecainide.  He decided that he would like to continue with this medication to ward off any future episodes of atrial fibrillation.  He has had 2 separate ablations.  This seems reasonable.  There was concern that the flecainide may be contributing to a potential neuropathy.  He has not had any further progression of the symptoms. -No recent episodes.  Chronic anticoagulation -Continue with Xarelto, no bleeding episodes.  CHA2DS2-VASc of 2.  Checking a CBC and a basic metabolic profile.  Obstructive sleep apnea -Continue with CPAP excellent. Dr. Radford Pax. New mask  Current sinus bradycardia -Monitor results reviewed.  Bradycardia noted.  I decided to decrease his diltiazem, dropping the 240 mg and continuing with the 120 mg long-acting dose.  Overall doing very well with this change.  Heart rate appropriately increased.  Overall he has been very stable, no syncope no orthopnea no chest pain no angina no shortness of breath.  Able to complete easily greater than 4 METS of activity without difficulty.  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   Medication  Adjustments/Labs and Tests Ordered: Current medicines are reviewed at length with the patient today.  Concerns regarding medicines are outlined above.  Orders Placed This Encounter  Procedures  . Basic Metabolic Panel (BMET)  . CBC   No  orders of the defined types were placed in this encounter.   Patient Instructions  Medication Instructions:  Your physician recommends that you continue on your current medications as directed. Please refer to the Current Medication list given to you today.  *If you need a refill on your cardiac medications before your next appointment, please call your pharmacy*   Lab Work: BMET AND CBC TODAY  If you have labs (blood work) drawn today and your tests are completely normal, you will receive your results only by: Marland Kitchen MyChart Message (if you have MyChart) OR . A paper copy in the mail If you have any lab test that is abnormal or we need to change your treatment, we will call you to review the results.   Testing/Procedures: None    Follow-Up: At [facility], you and your health needs are our priority.  As part of our continuing mission to provide you with exceptional heart care, we have created designated Provider Care Teams.  These Care Teams include your primary Cardiologist (physician) and Advanced Practice Providers (APPs -  Physician Assistants and Nurse Practitioners) who all work together to provide you with the care you need, when you need it.  We recommend signing up for the patient portal called "MyChart".  Sign up information is provided on this After Visit Summary.  MyChart is used to connect with patients for Virtual Visits (Telemedicine).  Patients are able to view lab/test results, encounter notes, upcoming appointments, etc.  Non-urgent messages can be sent to your provider as well.   To learn more about what you can do with MyChart, go to [email or web address].    Your next appointment:   6 month(s)  The format for your next  appointment:   In Person  Provider:   Candee Furbish, MD   Other Instructions      Signed, Candee Furbish, MD  10/28/2019 9:28 AM    [place]

## 2019-10-29 ENCOUNTER — Other Ambulatory Visit (HOSPITAL_COMMUNITY): Payer: Self-pay | Admitting: Nurse Practitioner

## 2019-11-01 ENCOUNTER — Encounter: Payer: Self-pay | Admitting: *Deleted

## 2019-11-02 DIAGNOSIS — G4733 Obstructive sleep apnea (adult) (pediatric): Secondary | ICD-10-CM | POA: Diagnosis not present

## 2019-11-11 DIAGNOSIS — N401 Enlarged prostate with lower urinary tract symptoms: Secondary | ICD-10-CM | POA: Diagnosis not present

## 2019-11-18 DIAGNOSIS — S39012A Strain of muscle, fascia and tendon of lower back, initial encounter: Secondary | ICD-10-CM | POA: Diagnosis not present

## 2019-11-18 DIAGNOSIS — N401 Enlarged prostate with lower urinary tract symptoms: Secondary | ICD-10-CM | POA: Diagnosis not present

## 2019-11-18 DIAGNOSIS — R3915 Urgency of urination: Secondary | ICD-10-CM | POA: Diagnosis not present

## 2019-11-25 ENCOUNTER — Emergency Department (HOSPITAL_BASED_OUTPATIENT_CLINIC_OR_DEPARTMENT_OTHER)
Admission: EM | Admit: 2019-11-25 | Discharge: 2019-11-25 | Disposition: A | Discharge status: Home / Self Care | Payer: Medicare PPO | Attending: Emergency Medicine | Admitting: Emergency Medicine

## 2019-11-25 ENCOUNTER — Encounter (HOSPITAL_COMMUNITY): Payer: Self-pay | Admitting: Nurse Practitioner

## 2019-11-25 ENCOUNTER — Ambulatory Visit (HOSPITAL_BASED_OUTPATIENT_CLINIC_OR_DEPARTMENT_OTHER)
Admission: RE | Admit: 2019-11-25 | Discharge: 2019-11-25 | Disposition: A | Discharge status: Home / Self Care | Payer: Medicare PPO | Source: Ambulatory Visit | Attending: Nurse Practitioner | Admitting: Nurse Practitioner

## 2019-11-25 ENCOUNTER — Other Ambulatory Visit: Payer: Self-pay

## 2019-11-25 ENCOUNTER — Encounter (HOSPITAL_BASED_OUTPATIENT_CLINIC_OR_DEPARTMENT_OTHER): Payer: Self-pay | Admitting: Emergency Medicine

## 2019-11-25 ENCOUNTER — Telehealth: Payer: Self-pay | Admitting: Cardiology

## 2019-11-25 ENCOUNTER — Emergency Department (HOSPITAL_BASED_OUTPATIENT_CLINIC_OR_DEPARTMENT_OTHER): Payer: Medicare PPO

## 2019-11-25 VITALS — BP 126/64 | HR 53 | Ht 71.0 in | Wt 230.2 lb

## 2019-11-25 DIAGNOSIS — I4891 Unspecified atrial fibrillation: Secondary | ICD-10-CM | POA: Diagnosis not present

## 2019-11-25 DIAGNOSIS — Z79899 Other long term (current) drug therapy: Secondary | ICD-10-CM | POA: Diagnosis not present

## 2019-11-25 DIAGNOSIS — D6869 Other thrombophilia: Secondary | ICD-10-CM

## 2019-11-25 DIAGNOSIS — I480 Paroxysmal atrial fibrillation: Secondary | ICD-10-CM | POA: Insufficient documentation

## 2019-11-25 DIAGNOSIS — I10 Essential (primary) hypertension: Secondary | ICD-10-CM | POA: Insufficient documentation

## 2019-11-25 DIAGNOSIS — Z7901 Long term (current) use of anticoagulants: Secondary | ICD-10-CM | POA: Insufficient documentation

## 2019-11-25 DIAGNOSIS — R079 Chest pain, unspecified: Secondary | ICD-10-CM | POA: Diagnosis not present

## 2019-11-25 LAB — BASIC METABOLIC PANEL
Anion gap: 11 (ref 5–15)
BUN: 24 mg/dL — ABNORMAL HIGH (ref 8–23)
CO2: 28 mmol/L (ref 22–32)
Calcium: 8.4 mg/dL — ABNORMAL LOW (ref 8.9–10.3)
Chloride: 103 mmol/L (ref 98–111)
Creatinine, Ser: 1.18 mg/dL (ref 0.61–1.24)
GFR calc Af Amer: >60 mL/min (ref >60)
GFR calc non Af Amer: >60 mL/min (ref >60)
Glucose, Bld: 134 mg/dL — ABNORMAL HIGH (ref 70–99)
Potassium: 3.8 mmol/L (ref 3.5–5.1)
Sodium: 142 mmol/L (ref 135–145)

## 2019-11-25 LAB — CBC
HCT: 48.7 % (ref 39.0–52.0)
Hemoglobin: 16.5 g/dL (ref 13.0–17.0)
MCH: 33.3 pg (ref 26.0–34.0)
MCHC: 33.9 g/dL (ref 30.0–36.0)
MCV: 98.4 fL (ref 80.0–100.0)
Platelets: 148 10*3/uL — ABNORMAL LOW (ref 150–400)
RBC: 4.95 MIL/uL (ref 4.22–5.81)
RDW: 13.2 % (ref 11.5–15.5)
WBC: 8.2 10*3/uL (ref 4.0–10.5)
nRBC: 0 % (ref 0.0–0.2)

## 2019-11-25 LAB — TROPONIN I (HIGH SENSITIVITY)
Troponin I (High Sensitivity): 10 ng/L (ref <18)
Troponin I (High Sensitivity): 9 ng/L (ref <18)

## 2019-11-25 MED ORDER — DILTIAZEM LOAD VIA INFUSION
15.0000 mg | Freq: Once | INTRAVENOUS | Status: AC
  Administered 2019-11-25: 15 mg via INTRAVENOUS
  Filled 2019-11-25: qty 15

## 2019-11-25 MED ORDER — DILTIAZEM HCL-DEXTROSE 125-5 MG/125ML-% IV SOLN (PREMIX)
5.0000 mg/h | INTRAVENOUS | Status: DC
  Administered 2019-11-25: 5 mg/h via INTRAVENOUS
  Filled 2019-11-25: qty 125

## 2019-11-25 MED ORDER — SODIUM CHLORIDE 0.9 % IV BOLUS
500.0000 mL | Freq: Once | INTRAVENOUS | Status: AC
  Administered 2019-11-25: 500 mL via INTRAVENOUS

## 2019-11-25 MED ORDER — PROPOFOL 10 MG/ML IV BOLUS
1.0000 mg/kg | Freq: Once | INTRAVENOUS | Status: AC
  Administered 2019-11-25: 103.5 mg via INTRAVENOUS
  Filled 2019-11-25: qty 20

## 2019-11-25 MED ORDER — ONDANSETRON HCL 4 MG/2ML IJ SOLN
4.0000 mg | Freq: Once | INTRAMUSCULAR | Status: AC
  Administered 2019-11-25: 4 mg via INTRAVENOUS
  Filled 2019-11-25: qty 2

## 2019-11-25 NOTE — ED Triage Notes (Signed)
Felt rapid hear beat at home 1 hour pta. Chest Pain 3/10. Rate 120 at home.Marland Kitchen alert, skin warm and dry.w/c to room.
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
## 2019-11-25 NOTE — Telephone Encounter (Signed)
Called patient back to let him know he could be seen by Carlos Palau NP at 11:30 am. Patient agreed to appointment.

## 2019-11-25 NOTE — ED Notes (Signed)
  Patient tolerated PO fluids well.  Orthostatics done and tolerated well.  Patient ambulated around nursing station without any dizziness or SOB.  Dr. Randal Buba notified

## 2019-11-25 NOTE — Telephone Encounter (Signed)
   Pt was seen in the ED yesterday and was told to call DR. Skains. No available appt until July and APPs first available 06/28. Pt said he needs to f/u sooner, he wanted to speak with Nurse, he said if we dont have available to call Orson Eva to arrange something for him

## 2019-11-25 NOTE — ED Provider Notes (Signed)
[facility] Provider Note   CSN: [id number] Arrival date & time: 11/25/19  0024     History Chief Complaint  Patient presents with  . Irregular Heart Beat    Carlos Thomas is a 69 y.o. male.  The history is provided by the patient.  Patient with a h/o PAF presents with sudden onset AFIB with RVR while watching the TV tonight.  Had been exerting himself out in the heat all day and may not have drank as well as he should.  Mild pressure with the incident.  No SOB.  No n/v/d.  Patient took an addition 120 mg of diltiazem and flecanide tonight to attempt to abate his symptoms and was unable to do so.  No fevers. No cough.  No abdominal, no back pain.       Past Medical History:  Diagnosis Date  . Anticoagulant long-term use    xarelto  . Balanitis   . GERD (gastroesophageal reflux disease)   . History of adenomatous polyp of colon    2006  tubular adenoma's and hyperplastic polyp's;   2011  tubular adenoma  . History of atrial flutter    s/p ablation atrial flutter/atrial fib in 2010  . History of MRSA infection    1990's  infected wound near surgerical area   . History of repair of hiatal hernia   . HTN (hypertension)   . Obesity (BMI 30-39.9)   . OSA on CPAP    Mild OSA  per study 05-07-2012-- w AHI 6.57/hr now on 13cm H2O  . PAF (paroxysmal atrial fibrillation) [facility]) primary cardiologist-  dr Marlou Porch   ep cardiologist-  dr Rayann Heman---  s/p  ablation afib and aflutter 2010  and  ablation afib/svt  2013  . Peripheral neuropathy, hereditary/idiopathic    bilateral feet---  neurologist- dr patel  . 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 S/P radiofrequency ablation operation for arrhythmia    01-09-2009  cardiac ablation afib/flutter;   09-13-2011  ablation afib/svt and in ther right atrium  . Wears glasses     Patient Active Problem List   Diagnosis Date Noted  . Hereditary and idiopathic peripheral neuropathy  06/03/2016  . URI (upper respiratory infection) 05/27/2014  . Obesity (BMI 30-39.9)   . OSA (obstructive sleep apnea) 06/11/2013  . Essential hypertension 02/07/2009  . PAF (paroxysmal atrial fibrillation) ([place]) 02/07/2009  . Atrial flutter ([place]) 02/07/2009    Past Surgical History:  Procedure Laterality Date  . ATRIAL FIBRILLATION ABLATION N/A 09/13/2011   Procedure: ATRIAL FIBRILLATION ABLATION;  Surgeon: Thompson Grayer, MD;  Location: [facility] CATH LAB;  Service: Cardiovascular;  Laterality: N/A;  . CARDIAC ELECTROPHYSIOLOGY MAPPING AND ABLATION  [phone]   dr allred   successful ablation atrial fibrillation /  atrial flutter  . CARDIAC ELECTROPHYSIOLOGY MAPPING AND ABLATION  09-13-2011   dr Rayann Heman   afib mapping/ ablation and  mapping/ablation in the right atrium  . CARDIOVASCULAR STRESS TEST  09/2006   per dr Marlou Porch note nuclear study normal w/ no ischemia  . CARDIOVERSION N/A 09/21/2015   Procedure: CARDIOVERSION;  Surgeon: Lelon Perla, MD;  Location: [facility] ENDOSCOPY;  Service: Cardiovascular;  Laterality: N/A;  . CIRCUMCISION N/A 06/27/2016   Procedure: CIRCUMCISION ADULT;  Surgeon: Carolan Clines, MD;  Location: [facility];  Service: Urology;  Laterality: N/A;  . COLONOSCOPY  last one 02/  2011  . DIRECT CURRENT CARDIOVERSION  02/02/2010   dr Leonia Reeves  .  LAPAROSCOPIC CHOLECYSTECTOMY  1990's  . OPEN HIATAL HERNIA REPAIR  1970's  . TEE WITHOUT CARDIOVERSION  09/12/2011   Procedure: TRANSESOPHAGEAL ECHOCARDIOGRAM (TEE);  Surgeon: Jettie Booze, MD;  Location: [facility] ENDOSCOPY;  Service: Cardiovascular;  Laterality: N/A;  trace AR, no LA/LAA thrombus, normal LVfunction, moderate descending aortic atherosclerosis  . TEE WITHOUT CARDIOVERSION N/A 09/21/2015   Procedure: TRANSESOPHAGEAL ECHOCARDIOGRAM (TEE);  Surgeon: Lelon Perla, MD;  Location: [facility] ENDOSCOPY;  Service: Cardiovascular;  Laterality: N/A; normal LV systolic funciton,  moderate LAE,  no LAA thrombus,   trace MR and TR  . TRANSTHORACIC ECHOCARDIOGRAM  06/03/2014   ef 55-60%/  trivial MR and TR       Family History  Problem Relation Age of Onset  . CAD Father   . Heart attack Father   . Atrial fibrillation Neg Hx   . Anesthesia problems Neg Hx     Social History   Tobacco Use  . Smoking status: Never Smoker  . Smokeless tobacco: Never Used  Substance Use Topics  . Alcohol use: No    Alcohol/week: 1.0 standard drinks    Types: 1 Cans of beer per week    Comment: rare  . Drug use: No    Home Medications Prior to Admission medications   Medication Sig Start Date End Date Taking? Authorizing Provider  diltiazem (CARTIA XT) 120 MG 24 hr capsule Take 1 capsule (120 mg total) by mouth at bedtime. 10/25/19   Sherran Needs, NP  flecainide (TAMBOCOR) 100 MG tablet TAKE 1 TABLET BY MOUTH TWICE DAILY 05/06/19   Jerline Pain, MD  omeprazole (PRILOSEC) 20 MG capsule Take 20 mg by mouth every morning.     [provider]  tamsulosin (FLOMAX) 0.4 MG CAPS capsule Take 0.4 mg by mouth daily.    [provider]  XARELTO 20 MG TABS tablet TAKE 1 TABLET BY MOUTH DAILY WITH SUPPER 07/14/19   Jerline Pain, MD    Allergies    Patient has no known allergies.  Review of Systems   Review of Systems  Constitutional: Negative for fever.  HENT: Negative for congestion.   Eyes: Negative for visual disturbance.  Respiratory: Negative for shortness of breath.   Cardiovascular: Positive for palpitations. Negative for leg swelling.  Gastrointestinal: Negative for abdominal pain.  Genitourinary: Negative for difficulty urinating.  Musculoskeletal: Negative for arthralgias.  Skin: Negative for color change.  Neurological: Negative for dizziness.  Psychiatric/Behavioral: Negative for agitation.  All other systems reviewed and are negative.   Physical Exam Updated Vital Signs BP (!) 135/97   Pulse 90   Resp 16   Wt 103.5 kg   SpO2 97%   BMI 31.82 kg/m   Physical  Exam Vitals and nursing note reviewed.  Constitutional:      General: He is not in acute distress.    Appearance: Normal appearance. He is not diaphoretic.  HENT:     Head: Normocephalic and atraumatic.     Nose: Nose normal.  Eyes:     Conjunctiva/sclera: Conjunctivae normal.     Pupils: Pupils are equal, round, and reactive to light.  Cardiovascular:     Rate and Rhythm: Tachycardia present. Rhythm irregular.     Pulses: Normal pulses.     Heart sounds: Normal heart sounds.  Pulmonary:     Effort: Pulmonary effort is normal.     Breath sounds: Normal breath sounds.  Abdominal:     General: Abdomen is flat. Bowel sounds are normal.  Tenderness: There is no abdominal tenderness. There is no guarding.  Musculoskeletal:        General: Normal range of motion.     Cervical back: Normal range of motion and neck supple.     Right lower leg: No edema.     Left lower leg: No edema.  Skin:    General: Skin is warm and dry.     Capillary Refill: Capillary refill takes less than 2 seconds.  Neurological:     General: No focal deficit present.     Mental Status: He is alert and oriented to person, place, and time.     Deep Tendon Reflexes: Reflexes normal.  Psychiatric:        Mood and Affect: Mood normal.        Behavior: Behavior normal.     ED Results / Procedures / Treatments   Labs (all labs ordered are listed, but only abnormal results are displayed) Labs Reviewed  BASIC METABOLIC PANEL  CBC  TROPONIN I (HIGH SENSITIVITY)    EKG EKG Interpretation  Date/Time:  Thursday Nov 25 2019 00:34:25 EDT Ventricular Rate:  153 PR Interval:    QRS Duration: 91 QT Interval:  292 QTC Calculation: 466 R Axis:   134 Text Interpretation: Atrial fibrillation with rapid V-rate Left posterior fascicular block Confirmed by Randal Buba, Wetona Viramontes ([id number]) on 11/25/2019 12:35:32 AM   Radiology No results found.  Procedures .Cardioversion  Date/Time: 11/25/2019 4:14 AM Performed by:  Veatrice Kells, MD Authorized by: Veatrice Kells, MD   Consent:    Consent obtained:  Written   Consent given by:  Patient   Risks discussed:  Cutaneous burn, death and induced arrhythmia   Alternatives discussed:  Rate-control medication Pre-procedure details:    Cardioversion basis:  Elective   Rhythm:  Atrial fibrillation   Electrode placement:  Anterior-posterior Patient sedated: Yes. Refer to sedation procedure documentation for details of sedation.  Attempt one:    Cardioversion mode:  Synchronous   Waveform:  Biphasic   Shock (Joules):  200   Cardioversion outcome attempt one: sinus bradycardia  Post-procedure details:    Patient status:  Awake   Patient tolerance of procedure:  Tolerated well, no immediate complications Comments:     Patient was very sedated with one 1 mg/kg of propofol and required bag valce mask for approximately 1.5 minutes and then became arousable.     .Sedation  Date/Time: 11/25/2019 4:23 AM Performed by: Veatrice Kells, MD Authorized by: Veatrice Kells, MD   Consent:    Consent obtained:  Written   Consent given by:  Patient   Risks discussed:  Allergic reaction, dysrhythmia, inadequate sedation, nausea, prolonged hypoxia resulting in organ damage, prolonged sedation necessitating reversal, respiratory compromise necessitating ventilatory assistance and intubation and vomiting Universal protocol:    Procedure explained and questions answered to patient or proxy's satisfaction: yes     Relevant documents present and verified: yes     Test results available and properly labeled: yes     Imaging studies available: yes     Required blood products, implants, devices, and special equipment available: yes     Site/side marked: yes     Immediately prior to procedure a time out was called: yes     Patient identity confirmation method:  Arm band Indications:    Procedure performed:  Cardioversion   Procedure necessitating sedation performed by:   Physician performing sedation Pre-sedation assessment:    Time since last food or drink:  6  ASA classification: class 2 - patient with mild systemic disease     Neck mobility: normal     Mouth opening:  3 or more finger widths   Mallampati score:  II - soft palate, uvula, fauces visible   Pre-sedation assessments completed and reviewed: pre-procedure airway patency not reviewed, pre-procedure cardiovascular function not reviewed, pre-procedure hydration status not reviewed, pre-procedure mental status not reviewed, pre-procedure nausea and vomiting status not reviewed and pre-procedure pain level not reviewed     Pre-sedation assessment completed:  11/25/2019 3:25 AM Immediate pre-procedure details:    Reassessment: Patient reassessed immediately prior to procedure     Reviewed: vital signs, relevant labs/tests and NPO status     Verified: bag valve mask available, emergency equipment available, intubation equipment available, IV patency confirmed and oxygen available   Procedure details (see MAR for exact dosages):    Preoxygenation:  Nasal cannula   Sedation:  Propofol   Intended level of sedation: deep   Intra-procedure monitoring:  Blood pressure monitoring, cardiac monitor and continuous pulse oximetry   Intra-procedure events: hypoxia     Intra-procedure management:  BVM ventilation   Total Provider sedation time (minutes):  30 Post-procedure details:    Post-sedation assessment completed:  11/25/2019 4:26 AM   Post-sedation assessments completed and reviewed: airway patency not reviewed, cardiovascular function not reviewed, hydration status not reviewed, mental status not reviewed, nausea/vomiting not reviewed and pain level not reviewed     Patient tolerance:  Tolerated well, no immediate complications   (including critical care time)  Medications Ordered in ED Medications  diltiazem (CARDIZEM) 1 mg/mL load via infusion 15 mg (15 mg Intravenous Bolus from Bag 11/25/19 0105)     And  diltiazem (CARDIZEM) 125 mg in dextrose 5% 125 mL (1 mg/mL) infusion (0 mg/hr Intravenous Paused 11/25/19 0312)  ondansetron (ZOFRAN) injection 4 mg (4 mg Intravenous Given 11/25/19 0313)  sodium chloride 0.9 % bolus 500 mL (500 mLs Intravenous New Bag/Given 11/25/19 0311)  propofol (DIPRIVAN) 10 mg/mL bolus/IV push 103.5 mg (103.5 mg Intravenous Given 11/25/19 0323)    ED Course  I have reviewed the triage vital signs and the nursing notes.  Pertinent labs & imaging results that were available during my care of the patient were reviewed by me and considered in my medical decision making (see chart for details).    MDM Reviewed: previous chart, nursing note and vitals Interpretation: x-ray and ECG (Negative CXR, normal troponins x 2 ) Total time providing critical care: 30-74 minutes (diltiazem and IVF.  multiple reevaluations for bradycardia and BP post cardioversion.  ). This excludes time spent performing separately reportable procedures and services.  CRITICAL CARE Performed by: Janayia Burggraf K Larrisha Babineau-Rasch Total critical care time: 60 minutes Critical care time was exclusive of separately billable procedures and treating other patients. Critical care was necessary to treat or prevent imminent or life-threatening deterioration. Critical care was time spent personally by me on the following activities: development of treatment plan with patient and/or surrogate as well as nursing, discussions with consultants, evaluation of patient's response to treatment, examination of patient, obtaining history from patient or surrogate, ordering and performing treatments and interventions, ordering and review of laboratory studies, ordering and review of radiographic studies, pulse oximetry and re-evaluation of patient's condition.  Patient was hesitant to try cardioversion at first and EDP stated we would try cardizem, if that was not working we would need to pursue cardioversion.  I gave the cardizem more  than an hour without change in  rhythm.  Rate was better controlled.  Decision made to proceed with cardioversion.    Given the patient's age I deemed 1 mg/kg to be a sufficient dose to induce conscious sedation.  Patient had been NPO since 9 pm which was 6 hours.  I did premedicate with zofran to prevent any vomiting should the patient have a increased GI transit time.    Patient was rendered sedated within 60 seconds from propfol and one synced shock at 200J brought the patient to sinus bradycardia.  Based on previous records, this has happened in the past.  Patient required BVM for approximately 1.5 minutes as the patient was not breathing adequately.  Patient then awoke and began breathing without difficulty.    Patient remained bradycardiac, likely secondary to the PO diltiazem and flecainide take PTA. Despite BP (likely secondary to medication prior to arrival and prolonged propofol effect) he has been given IVF and has strong radial and dorsalis pedis pulses and normal color.   He has been given IV fluids and we are monitoring the patient.  He is awake, alert and jovial and states he feel well.    Orthostatic VS for the past 24 hrs:  BP- Lying Pulse- Lying BP- Sitting Pulse- Sitting BP- Standing at 0 minutes Pulse- Standing at 0 minutes  11/25/19 0610 108/58 51 105/60 54 111/60 55   Bradycardia is almost certainly from extra flecainide and cartia xt taken PTA.  Patient ambulated about the department without difficulty.  Did not feel syncopal.  Patient told to hold his cartia XT and flecainide and call cardiology this am.  Patient and wife verbalizes understanding and agree to follow up.   Final Clinical Impression(s) / ED Diagnoses  Return for intractable cough, coughing up blood,fevers >100.4 unrelieved by medication, shortness of breath, intractable vomiting, chest pain, shortness of breath, weakness,numbness, changes in speech, facial asymmetry,abdominal pain, passing out,Inability to  tolerate liquids or food, cough, altered mental status or any concerns. No signs of systemic illness or infection. The patient is nontoxic-appearing on exam and vital signs are within normal limits.   I have reviewed the triage vital signs and the nursing notes. Pertinent labs &imaging results that were available during my care of the patient were reviewed by me and considered in my medical decision making (see chart for details).After history, exam, and medical workup I feel the patient has beenappropriately medically screened and is safe for discharge home. Pertinent diagnoses were discussed with the patient. Patient was given return precautions.   Koen Antilla, MD 11/25/19 [phone]

## 2019-11-27 ENCOUNTER — Other Ambulatory Visit: Payer: Self-pay

## 2019-11-27 ENCOUNTER — Telehealth: Payer: Self-pay | Admitting: Physician Assistant

## 2019-11-27 ENCOUNTER — Emergency Department (HOSPITAL_BASED_OUTPATIENT_CLINIC_OR_DEPARTMENT_OTHER)
Admission: EM | Admit: 2019-11-27 | Discharge: 2019-11-27 | Disposition: A | Discharge status: Home / Self Care | Payer: Medicare PPO | Attending: Emergency Medicine | Admitting: Emergency Medicine

## 2019-11-27 ENCOUNTER — Encounter (HOSPITAL_BASED_OUTPATIENT_CLINIC_OR_DEPARTMENT_OTHER): Payer: Self-pay | Admitting: Emergency Medicine

## 2019-11-27 DIAGNOSIS — Z7901 Long term (current) use of anticoagulants: Secondary | ICD-10-CM | POA: Insufficient documentation

## 2019-11-27 DIAGNOSIS — Z79899 Other long term (current) drug therapy: Secondary | ICD-10-CM | POA: Diagnosis not present

## 2019-11-27 DIAGNOSIS — I480 Paroxysmal atrial fibrillation: Secondary | ICD-10-CM | POA: Diagnosis not present

## 2019-11-27 DIAGNOSIS — I4891 Unspecified atrial fibrillation: Secondary | ICD-10-CM | POA: Diagnosis present

## 2019-11-27 MED ORDER — ETOMIDATE 2 MG/ML IV SOLN
INTRAVENOUS | Status: AC | PRN
  Administered 2019-11-27: 31.32 mg via INTRAVENOUS

## 2019-11-27 MED ORDER — ETOMIDATE 2 MG/ML IV SOLN
0.1500 mg/kg | Freq: Once | INTRAVENOUS | Status: DC
  Filled 2019-11-27: qty 10

## 2019-11-27 MED ORDER — ONDANSETRON HCL 4 MG/2ML IJ SOLN
4.0000 mg | Freq: Once | INTRAMUSCULAR | Status: AC
  Administered 2019-11-27: 4 mg via INTRAVENOUS
  Filled 2019-11-27: qty 2

## 2019-11-27 NOTE — ED Provider Notes (Signed)
[place] DEPT [facility] Provider Note: Georgena Spurling, MD, FACEP  CSN: [id number] MRN: [id number] ARRIVAL: 11/27/19 at [place]: [place]  Atrial Fibrillation   HISTORY OF PRESENT ILLNESS  11/27/19 5:43 AM Carlos Thomas is a 69 y.o. male with a history of atrial fibrillation.  In the past 11 years he has had 2 ablations and has been cardioverted 4 times including in this ED 2 nights ago [NB: Patient had post cardioversion bradycardia, likely due to taking Cardizem giving in ED prior to cardioversion.]  He is here with a return of atrial fibrillation with palpitations that began about 423 this morning.  He has a sensation of a rapid as well as irregular heartbeat.  He denies any chest pain but does feel like he has indigestion.  He denies shortness of breath.  He took his normal dose of Cardizem which is 120 mg daily.  This was decreased from 360 mg daily about a month ago due to fatigue.   Past Medical History:  Diagnosis Date  . Anticoagulant long-term use    xarelto  . Balanitis   . GERD (gastroesophageal reflux disease)   . History of adenomatous polyp of colon    2006  tubular adenoma's and hyperplastic polyp's;   2011  tubular adenoma  . History of atrial flutter    s/p ablation atrial flutter/atrial fib in 2010  . History of MRSA infection    1990's  infected wound near surgerical area   . History of repair of hiatal hernia   . HTN (hypertension)   . Obesity (BMI 30-39.9)   . OSA on CPAP    Mild OSA  per study 05-07-2012-- w AHI 6.57/hr now on 13cm H2O  . PAF (paroxysmal atrial fibrillation) [facility]) primary cardiologist-  dr Marlou Porch   ep cardiologist-  dr Rayann Heman---  s/p  ablation afib and aflutter 2010  and  ablation afib/svt  2013  . Peripheral neuropathy, hereditary/idiopathic    bilateral feet---  neurologist- dr patel  . 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 S/P radiofrequency ablation operation for arrhythmia     01-09-2009  cardiac ablation afib/flutter;   09-13-2011  ablation afib/svt and in ther right atrium  . Wears glasses     Past Surgical History:  Procedure Laterality Date  . ATRIAL FIBRILLATION ABLATION N/A 09/13/2011   Procedure: ATRIAL FIBRILLATION ABLATION;  Surgeon: Thompson Grayer, MD;  Location: [facility] CATH LAB;  Service: Cardiovascular;  Laterality: N/A;  . CARDIAC ELECTROPHYSIOLOGY MAPPING AND ABLATION  [phone]   dr allred   successful ablation atrial fibrillation /  atrial flutter  . CARDIAC ELECTROPHYSIOLOGY MAPPING AND ABLATION  09-13-2011   dr Rayann Heman   afib mapping/ ablation and  mapping/ablation in the right atrium  . CARDIOVASCULAR STRESS TEST  09/2006   per dr Marlou Porch note nuclear study normal w/ no ischemia  . CARDIOVERSION N/A 09/21/2015   Procedure: CARDIOVERSION;  Surgeon: Lelon Perla, MD;  Location: [facility] ENDOSCOPY;  Service: Cardiovascular;  Laterality: N/A;  . CIRCUMCISION N/A 06/27/2016   Procedure: CIRCUMCISION ADULT;  Surgeon: Carolan Clines, MD;  Location: [facility];  Service: Urology;  Laterality: N/A;  . COLONOSCOPY  last one 02/  2011  . DIRECT CURRENT CARDIOVERSION  02/02/2010   dr Leonia Reeves  . LAPAROSCOPIC CHOLECYSTECTOMY  1990's  . OPEN HIATAL HERNIA REPAIR  1970's  . TEE WITHOUT CARDIOVERSION  09/12/2011   Procedure: TRANSESOPHAGEAL ECHOCARDIOGRAM (TEE);  Surgeon: Conception Oms  Hassell Done, MD;  Location: [facility] ENDOSCOPY;  Service: Cardiovascular;  Laterality: N/A;  trace AR, no LA/LAA thrombus, normal LVfunction, moderate descending aortic atherosclerosis  . TEE WITHOUT CARDIOVERSION N/A 09/21/2015   Procedure: TRANSESOPHAGEAL ECHOCARDIOGRAM (TEE);  Surgeon: Lelon Perla, MD;  Location: [facility] ENDOSCOPY;  Service: Cardiovascular;  Laterality: N/A; normal LV systolic funciton,  moderate LAE,  no LAA thrombus,  trace MR and TR  . TRANSTHORACIC ECHOCARDIOGRAM  06/03/2014   ef 55-60%/  trivial MR and TR    Family History  Problem Relation Age of Onset   . CAD Father   . Heart attack Father   . Atrial fibrillation Neg Hx   . Anesthesia problems Neg Hx     Social History   Tobacco Use  . Smoking status: Never Smoker  . Smokeless tobacco: Never Used  Substance Use Topics  . Alcohol use: No    Alcohol/week: 1.0 standard drinks    Types: 1 Cans of beer per week    Comment: rare  . Drug use: No    Prior to Admission medications   Medication Sig Start Date End Date Taking? Authorizing Provider  diltiazem (CARTIA XT) 120 MG 24 hr capsule Take 1 capsule (120 mg total) by mouth at bedtime. 10/25/19   Sherran Needs, NP  flecainide (TAMBOCOR) 100 MG tablet TAKE 1 TABLET BY MOUTH TWICE DAILY 05/06/19   Jerline Pain, MD  omeprazole (PRILOSEC) 20 MG capsule Take 20 mg by mouth every morning.     [provider]  tamsulosin (FLOMAX) 0.4 MG CAPS capsule Take 0.4 mg by mouth daily.    [provider]  XARELTO 20 MG TABS tablet TAKE 1 TABLET BY MOUTH DAILY WITH SUPPER 07/14/19   Jerline Pain, MD    Allergies Patient has no known allergies.   REVIEW OF SYSTEMS  Negative except as noted here or in the History of Present Illness.   PHYSICAL EXAMINATION  Initial Vital Signs Blood pressure (!) 133/96, pulse (!) 145, temperature 97.9 F (36.6 C), temperature source Oral, resp. rate (!) 22, height 5\' 11"  (1.803 m), weight 104.4 kg, SpO2 98 %.  Examination General: Well-developed, well-nourished male in no acute distress; appearance consistent with age of record HENT: normocephalic; atraumatic Eyes: pupils equal, round and reactive to light; extraocular muscles intact Neck: supple Heart: Regular rhythm; tachycardia Lungs: clear to auscultation bilaterally Abdomen: soft; nondistended; nontender; bowel sounds present Extremities: No deformity; full range of motion; pulses normal Neurologic: Awake, alert and oriented; motor function intact in all extremities and symmetric; no facial droop Skin: Warm and dry Psychiatric:  Mildly anxious   RESULTS  Summary of this visit's results, reviewed and interpreted by myself:  EKG Interpretation:  Date & Time: 11/27/2019 5:34 AM  Rate: 163  Rhythm: atrial fibrillation and Rapid ventricular response  QRS Axis: right  Intervals: normal  ST/T Wave abnormalities: nonspecific ST changes  Conduction Disutrbances:none  Narrative Interpretation:   Old EKG Reviewed: Previously normal sinus rhythm   EKG Interpretation:  Date & Time: 11/27/2019 6:02 AM  Rate: 84  Rhythm: normal sinus rhythm  QRS Axis: right  Intervals: normal  ST/T Wave abnormalities: normal  Conduction Disutrbances:none  Narrative Interpretation:   Old EKG Reviewed: Previously atrial fibrillation with rapid ventricular response  Laboratory Studies: No results found for this or any previous visit (from the past 24 hour(s)). Imaging Studies: No results found.  ED COURSE and MDM  Nursing notes, initial and subsequent vitals signs, including pulse oximetry,  reviewed and interpreted by myself.  Vitals:   11/27/19 0604 11/27/19 0609 11/27/19 0620 11/27/19 0627  BP: (!) 177/103 (!) 154/92 (!) 146/95 135/83  Pulse: 89 79 70 64  Resp: (!) 22 18 16 16   Temp:      TempSrc:      SpO2: 96% 97% 99% 98%  Weight:      Height:       Medications  etomidate (AMIDATE) injection 15.66 mg (has no administration in time range)  etomidate (AMIDATE) injection (31.32 mg Intravenous Given 11/27/19 0600)    6:06 AM Patient successfully cardioverted under etomidate sedation.  Patient not given Cardizem prior to cardioversion and patient was not bradycardic or hypotensive post procedure.  PROCEDURES  .Sedation  Date/Time: 11/27/2019 5:48 AM Performed by: Hilda Wexler, MD Authorized by: Burnie Therien, MD   Consent:    Consent obtained:  Verbal   Consent given by:  Patient   Risks discussed:  Respiratory compromise necessitating ventilatory assistance and intubation Universal protocol:    Procedure  explained and questions answered to patient or proxy's satisfaction: yes     Relevant documents present and verified: yes     Test results available and properly labeled: yes     Required blood products, implants, devices, and special equipment available: yes     Site/side marked: no     Immediately prior to procedure a time out was called: yes     Patient identity confirmation method:  Arm band and verbally with patient Indications:    Procedure performed:  Cardioversion   Procedure necessitating sedation performed by:  Physician performing sedation Pre-sedation assessment:    Time since last food or drink:  9 PM yesterday   ASA classification: class 2 - patient with mild systemic disease     Neck mobility: normal     Mouth opening:  3 or more finger widths   Mallampati score:  III - soft palate, base of uvula visible   Pre-sedation assessments completed and reviewed: airway patency, cardiovascular function, hydration status, mental status, nausea/vomiting, pain level, respiratory function and temperature     Pre-sedation assessment completed:  11/27/2019 5:50 AM Immediate pre-procedure details:    Reassessment: Patient reassessed immediately prior to procedure     Reviewed: vital signs and relevant labs/tests     Verified: bag valve mask available, emergency equipment available, intubation equipment available, IV patency confirmed, oxygen available and suction available   Procedure details (see MAR for exact dosages):    Preoxygenation:  Nasal cannula   Sedation:  Etomidate   Intended level of sedation: deep   Analgesia:  None   Intra-procedure monitoring:  Blood pressure monitoring, cardiac monitor, continuous capnometry, continuous pulse oximetry, frequent LOC assessments and frequent vital sign checks   Intra-procedure events: none     Total Provider sedation time (minutes):  10 Post-procedure details:    Post-sedation assessment completed:  11/27/2019 6:45 AM   Attendance:  Constant attendance by certified staff until patient recovered     Recovery: Patient returned to pre-procedure baseline     Post-sedation assessments completed and reviewed: airway patency, cardiovascular function, hydration status, mental status, nausea/vomiting, pain level, respiratory function and temperature     Patient is stable for discharge or admission: yes     Patient tolerance:  Tolerated well, no immediate complications .Cardioversion  Date/Time: 11/27/2019 5:53 AM Performed by: Liliane Mallis, MD Authorized by: Mehkai Gallo, MD   Consent:    Consent obtained:  Verbal   Consent given by:  Patient   Risks discussed:  Cutaneous burn and induced arrhythmia   Alternatives discussed:  Rate-control medication Pre-procedure details:    Cardioversion basis:  Emergent   Rhythm:  Atrial fibrillation   Electrode placement:  Anterior-posterior Patient sedated: Yes. Refer to sedation procedure documentation for details of sedation.  Attempt one:    Cardioversion mode:  Synchronous   Waveform:  Biphasic   Shock (Joules):  200   Shock outcome:  Conversion to normal sinus rhythm Post-procedure details:    Patient tolerance of procedure:  Tolerated well, no immediate complications Comments:     6:03 AM Patient still sedated.  See sedation procedure note.     ED DIAGNOSES     ICD-10-CM   1. Paroxysmal atrial fibrillation with rapid ventricular response ([place])  I48.0        Chrisie Jankovich, Jenny Reichmann, MD 11/27/19 [phone]

## 2019-11-27 NOTE — ED Triage Notes (Signed)
  Patient comes in with atrial fibrillation that started around 0423 this morning.  Patient was seen two days ago for the same and cardioverted.  Has hx of two ablations and been cardioverted 4 times since 2010. Patient took his prescribed flecainide last night before bed.  No pain but endorses indigestion.

## 2019-11-27 NOTE — Telephone Encounter (Signed)
Pt called following a second ER visit for Afib. He was seen and DCCV out of Afib on 5/27 and again on 5/29. We were not consulted during these visits. He states that he thinks these episodes coincided with a recent decrease in cardizem. Cardizem dose was reduced for bradycardia seen on heart monitor. He is very upset about these episodes. I explained that it is difficult to increase cardizem again due to bradycardia and I do not feel comfortable titrating flecainide over the phone. He wants to increase the dose of cardizem. He may try to do 120 mg BID, but explained that this may make his heart rate too low. 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 I told him I would send a message to get him seen as soon as possible in Afib clinic. He expressed understanding of the plan.

## 2019-11-27 NOTE — ED Notes (Signed)
  Patient tolerated PO fluids well and ambulated to nursing station with no dizziness.

## 2019-12-01 ENCOUNTER — Other Ambulatory Visit: Payer: Self-pay

## 2019-12-01 ENCOUNTER — Ambulatory Visit (HOSPITAL_COMMUNITY)
Admission: RE | Admit: 2019-12-01 | Discharge: 2019-12-01 | Disposition: A | Discharge status: Home / Self Care | Payer: Medicare PPO | Source: Ambulatory Visit | Attending: Nurse Practitioner | Admitting: Nurse Practitioner

## 2019-12-01 ENCOUNTER — Encounter (HOSPITAL_COMMUNITY): Payer: Self-pay | Admitting: Nurse Practitioner

## 2019-12-01 VITALS — BP 150/60 | HR 54 | Ht 71.0 in | Wt 226.4 lb

## 2019-12-01 DIAGNOSIS — Z86010 Personal history of colonic polyps: Secondary | ICD-10-CM | POA: Diagnosis not present

## 2019-12-01 DIAGNOSIS — Z7901 Long term (current) use of anticoagulants: Secondary | ICD-10-CM | POA: Diagnosis not present

## 2019-12-01 DIAGNOSIS — Z8249 Family history of ischemic heart disease and other diseases of the circulatory system: Secondary | ICD-10-CM | POA: Insufficient documentation

## 2019-12-01 DIAGNOSIS — E669 Obesity, unspecified: Secondary | ICD-10-CM | POA: Diagnosis not present

## 2019-12-01 DIAGNOSIS — Z6831 Body mass index (BMI) 31.0-31.9, adult: Secondary | ICD-10-CM | POA: Diagnosis not present

## 2019-12-01 DIAGNOSIS — G4733 Obstructive sleep apnea (adult) (pediatric): Secondary | ICD-10-CM | POA: Diagnosis not present

## 2019-12-01 DIAGNOSIS — I480 Paroxysmal atrial fibrillation: Secondary | ICD-10-CM | POA: Insufficient documentation

## 2019-12-01 DIAGNOSIS — D6869 Other thrombophilia: Secondary | ICD-10-CM | POA: Diagnosis not present

## 2019-12-01 DIAGNOSIS — G609 Hereditary and idiopathic neuropathy, unspecified: Secondary | ICD-10-CM | POA: Insufficient documentation

## 2019-12-01 DIAGNOSIS — Z9049 Acquired absence of other specified parts of digestive tract: Secondary | ICD-10-CM | POA: Diagnosis not present

## 2019-12-01 DIAGNOSIS — K219 Gastro-esophageal reflux disease without esophagitis: Secondary | ICD-10-CM | POA: Diagnosis not present

## 2019-12-01 DIAGNOSIS — I4891 Unspecified atrial fibrillation: Secondary | ICD-10-CM | POA: Diagnosis present

## 2019-12-01 DIAGNOSIS — Z79899 Other long term (current) drug therapy: Secondary | ICD-10-CM | POA: Diagnosis not present

## 2019-12-01 DIAGNOSIS — I10 Essential (primary) hypertension: Secondary | ICD-10-CM | POA: Diagnosis not present

## 2019-12-01 MED ORDER — DILTIAZEM HCL 30 MG PO TABS
ORAL_TABLET | ORAL | 1 refills | Status: DC
Start: 2019-12-01 — End: 2021-03-22

## 2019-12-01 MED ORDER — DILTIAZEM HCL ER COATED BEADS 120 MG PO CP24: ORAL_CAPSULE | ORAL | Status: DC

## 2019-12-01 NOTE — Progress Notes (Signed)
Primary Care Physician: Shirline Frees, MD Referring Physician: [facility] ER f/u EP: Dr. Loney Hering Carlos Thomas is a 69 y.o. male with a h/o afib s/p w ablations, 2010 and 2013 that intermittently requires cardioversion, on flecainide. Pt was involved in a traffic situation that escalated his HR on Friday and immediately felt his heart go into afib. He thought he took an extra flecainide but by mistake, he took a nausea pill. He went to the  ER on Saturday but the ER doc did not want to cardiovert as he was rate controlled. He returned on Sunday to the ER with afib around 120 bpm and received successful  cardioversion.  On last visit in April 2019, he continued to maintain S brady at 50 bpm. Continues on xarelto 20 mg daily. He was given an extra 120 mg diltiazem to add to his 240 mg daily Cardizem in the ER.  F/u afib clinic, 12/6. He is in SR and has not had any afib but has neuropathy and his neurologist said that flecainide may be worsening his symptoms. He says that he has had neuropathy  for awhile and the symptoms are stable. He does not want to stop flecainide but wanted to know our experience with this.   F/u in afib clinic, 09/28/19. I have not seen pt since 2019. He decided to stay on the flecainide after his last visit as  we discussed staying on flecainide despite it may be worsening his peripheral neuropathy as mentioned by his neurologist. He states that it has stayed about the same and he prefers to continue its use. He has not noted any afib. EKG shows sinus brady at 46 bpm, LAFB.   F/u in afib clinic, 5/27. He had a very busy day yesterday, doing multiple activities outside around a lot of pollen. He coughed  hard last pm and wento out of rhythm. He took his Cardizem and his flecainide and when he was still out of rhythm a couple hours later, he went to [facility]. After unsuccessfully  trying to convert with IV Cardizem, he was successfully cardioverted. He did have some  issues  with hypotension and bradycardia afterward. He did not leave there until 7:30 this am. Now his BP is normal and he remains  in sinus brady at 53 bpm. He is very tired for being up all night.   F/u in afib clinic, 12/01/19. He had another cardioversion 5/29 and is quite upset that he has had 2 episodes in the last few days. We discussed that he did not go to have an urgent cardioversion if he was stable. He does come out of cardioversion bradycardic and with low BP with an extra 120 mg Cardizem po  or at the time of his first DCCV, Cardizem was used first IV. We did discuss that he could use 30 mg Cardizem that may convert without going to the  ER and not drop his BP/HR as much. He has himself gone back up to 120 mg bid of Cardizem but have asked him not to increase further as he had HR's in the 40's with higher doses of CCB. Now HR mid 50's. He had an atrial flutter ablation in 2010 and an afib ablation in 2013 and may be time to see Dr. Rayann Heman  for an repeat afib ablation as he has had a very long time in [place]. He will need updated echo. The other option would be to change antiarrythmic to Tikosyn.  Today, he denies symptoms of palpitations, chest pain, shortness of breath, orthopnea, PND, lower extremity edema, dizziness, presyncope, syncope, or neurologic sequela. The patient is tolerating medications without difficulties and is otherwise without complaint today.   Past Medical History:  Diagnosis Date  . Anticoagulant long-term use    xarelto  . Balanitis   . GERD (gastroesophageal reflux disease)   . History of adenomatous polyp of colon    2006  tubular adenoma's and hyperplastic polyp's;   2011  tubular adenoma  . History of atrial flutter    s/p ablation atrial flutter/atrial fib in 2010  . History of MRSA infection    1990's  infected wound near surgerical area   . History of repair of hiatal hernia   . HTN (hypertension)   . Obesity (BMI 30-39.9)   . OSA on CPAP    Mild OSA  per study  05-07-2012-- w AHI 6.57/hr now on 13cm H2O  . PAF (paroxysmal atrial fibrillation) [facility]) primary cardiologist-  dr Marlou Porch   ep cardiologist-  dr Rayann Heman---  s/p  ablation afib and aflutter 2010  and  ablation afib/svt  2013  . Peripheral neuropathy, hereditary/idiopathic    bilateral feet---  neurologist- dr patel  . 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 S/P radiofrequency ablation operation for arrhythmia    01-09-2009  cardiac ablation afib/flutter;   09-13-2011  ablation afib/svt and in ther right atrium  . Wears glasses    Past Surgical History:  Procedure Laterality Date  . ATRIAL FIBRILLATION ABLATION N/A 09/13/2011   Procedure: ATRIAL FIBRILLATION ABLATION;  Surgeon: Thompson Grayer, MD;  Location: [facility] CATH LAB;  Service: Cardiovascular;  Laterality: N/A;  . CARDIAC ELECTROPHYSIOLOGY MAPPING AND ABLATION  [phone]   dr allred   successful ablation atrial fibrillation /  atrial flutter  . CARDIAC ELECTROPHYSIOLOGY MAPPING AND ABLATION  09-13-2011   dr Rayann Heman   afib mapping/ ablation and  mapping/ablation in the right atrium  . CARDIOVASCULAR STRESS TEST  09/2006   per dr Marlou Porch note nuclear study normal w/ no ischemia  . CARDIOVERSION N/A 09/21/2015   Procedure: CARDIOVERSION;  Surgeon: Lelon Perla, MD;  Location: [facility] ENDOSCOPY;  Service: Cardiovascular;  Laterality: N/A;  . CIRCUMCISION N/A 06/27/2016   Procedure: CIRCUMCISION ADULT;  Surgeon: Carolan Clines, MD;  Location: [facility];  Service: Urology;  Laterality: N/A;  . COLONOSCOPY  last one 02/  2011  . DIRECT CURRENT CARDIOVERSION  02/02/2010   dr Leonia Reeves  . LAPAROSCOPIC CHOLECYSTECTOMY  1990's  . OPEN HIATAL HERNIA REPAIR  1970's  . TEE WITHOUT CARDIOVERSION  09/12/2011   Procedure: TRANSESOPHAGEAL ECHOCARDIOGRAM (TEE);  Surgeon: Jettie Booze, MD;  Location: [facility] ENDOSCOPY;  Service: Cardiovascular;  Laterality: N/A;  trace AR, no LA/LAA thrombus, normal LVfunction,  moderate descending aortic atherosclerosis  . TEE WITHOUT CARDIOVERSION N/A 09/21/2015   Procedure: TRANSESOPHAGEAL ECHOCARDIOGRAM (TEE);  Surgeon: Lelon Perla, MD;  Location: [facility] ENDOSCOPY;  Service: Cardiovascular;  Laterality: N/A; normal LV systolic funciton,  moderate LAE,  no LAA thrombus,  trace MR and TR  . TRANSTHORACIC ECHOCARDIOGRAM  06/03/2014   ef 55-60%/  trivial MR and TR    Current Outpatient Medications  Medication Sig Dispense Refill  . diltiazem (CARTIA XT) 120 MG 24 hr capsule Taking one tablet by mouth in the am and one tablet at bedtime    . flecainide (TAMBOCOR) 100 MG tablet TAKE 1 TABLET BY MOUTH TWICE DAILY 60 tablet  11  . omeprazole (PRILOSEC) 20 MG capsule Take 20 mg by mouth every morning.     . tamsulosin (FLOMAX) 0.4 MG CAPS capsule Take 0.8 mg by mouth daily.     Alveda Reasons 20 MG TABS tablet TAKE 1 TABLET BY MOUTH DAILY WITH SUPPER 30 tablet 5  . diltiazem (CARDIZEM) 30 MG tablet Take 1 tablet every 4 hours AS NEEDED for AFIB heart rate >100 45 tablet 1   No current facility-administered medications for this encounter.    No Known Allergies  Social History   Socioeconomic History  . Marital status: Divorced    Spouse name: Not on file  . Number of children: Not on file  . Years of education: Not on file  . Highest education level: Not on file  Occupational History  . Not on file  Tobacco Use  . Smoking status: Never Smoker  . Smokeless tobacco: Never Used  Substance and Sexual Activity  . Alcohol use: No    Alcohol/week: 1.0 standard drinks    Types: 1 Cans of beer per week    Comment: rare  . Drug use: No  . Sexual activity: Not on file  Other Topics Concern  . 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  Education: college.   Social Determinants of Health   Financial Resource Strain:   . Difficulty of Paying Living Expenses:    Food Insecurity:   . Worried About Charity fundraiser in the Last Year:   . Arboriculturist in the Last Year:   Transportation Needs:   . Film/video editor (Medical):   Marland Kitchen Lack of Transportation (Non-Medical):   Physical Activity:   . Days of Exercise per Week:   . Minutes of Exercise per Session:   Stress:   . Feeling of Stress :   Social Connections:   . Frequency of Communication with Friends and Family:   . Frequency of Social Gatherings with Friends and Family:   . Attends Religious Services:   . Active Member of Clubs or Organizations:   . Attends Archivist Meetings:   Marland Kitchen Marital Status:   Intimate Partner Violence:   . Fear of Current or Ex-Partner:   . Emotionally Abused:   Marland Kitchen Physically Abused:   . Sexually Abused:     Family History  Problem Relation Age of Onset  . CAD Father   . Heart attack Father   . Atrial fibrillation Neg Hx   . Anesthesia problems Neg Hx     ROS- All systems are reviewed and negative except as per the HPI above  Physical Exam: Vitals:   12/01/19 1348  BP: (!) 150/60  Pulse: (!) 54  Weight: 102.7 kg  Height: 5\' 11"  (1.803 m)   Wt Readings from Last 3 Encounters:  12/01/19 102.7 kg  11/27/19 104.4 kg  11/25/19 104.4 kg    Labs: Lab Results  Component Value Date   NA 142 11/25/2019   K 3.8 11/25/2019   CL 103 11/25/2019   CO2 28 11/25/2019   GLUCOSE 134 (H) 11/25/2019   BUN 24 (H) 11/25/2019   CREATININE 1.18 11/25/2019   CALCIUM 8.4 (L) 11/25/2019   MG 2.0 05/24/2014   Lab Results  Component Value Date   INR 1.26 10/20/2016   No results found for: CHOL, HDL, LDLCALC, TRIG   GEN-  The patient is well appearing, alert and oriented x 3 today.   Head- normocephalic, atraumatic Eyes-  Sclera clear, conjunctiva pink Ears- hearing intact Oropharynx- clear Neck- supple, no JVP Lymph- no cervical lymphadenopathy Lungs- Clear to ausculation bilaterally, normal work of breathing Heart- slow regular rate  and rhythm, no murmurs, rubs or gallops, PMI not laterally displaced GI- soft, NT, ND, + BS Extremities- no clubbing, cyanosis, or edema MS- no significant deformity or atrophy Skin- no rash or lesion Psych- euthymic mood, full affect Neuro- strength and sensation are intact  EKG- Sinus brady at  54 ms, pr int 170  ms, qrs int 94 ms, qtc 392 ms  Epic records reviewed   Assessment and Plan: 1. Paroxysmal afib Maintaining  SR until 5/27 and has had 2 cardioversions since then Pt goes to the ER fairly quickly for DCCV once going into afib We discussed if stable, can try 30 mg Cardizem first, give it a few hours to see if he will go back into SR on his own  Continue flecainide at 100 mg bid, would prefer he not use a lot of extra flecainide to flip him as he has LAFB at baseline Continue diltiazem at 120 mg bid, watch for symptomatic bradycardia  Continue xarelto 20 mg daily for a chadsvasc score of at least  2, told not to interrupt for 4 weeks after cardioversion   Continue cpap He would like to discuss with Dr. Rayann Heman repeat ablation Will update echo and get him an appointment    Butch Penny C. Masen Salvas, [facility] [address] [place], [place] [address] [phone]

## 2019-12-01 NOTE — Patient Instructions (Signed)
Cardizem 30mg  -- take 1 tablet every 4 hours AS NEEDED for heart rate >100 as long as top number of blood pressure >100.    Scheduler will contact you regarding appointment with Dr. Rayann Heman

## 2019-12-03 DIAGNOSIS — G4733 Obstructive sleep apnea (adult) (pediatric): Secondary | ICD-10-CM | POA: Diagnosis not present

## 2019-12-08 ENCOUNTER — Ambulatory Visit (HOSPITAL_COMMUNITY)
Admission: RE | Admit: 2019-12-08 | Discharge: 2019-12-08 | Disposition: A | Discharge status: Home / Self Care | Payer: Medicare PPO | Source: Ambulatory Visit | Attending: Nurse Practitioner | Admitting: Nurse Practitioner

## 2019-12-08 ENCOUNTER — Other Ambulatory Visit: Payer: Self-pay

## 2019-12-08 DIAGNOSIS — I480 Paroxysmal atrial fibrillation: Secondary | ICD-10-CM | POA: Diagnosis not present

## 2019-12-08 DIAGNOSIS — G4730 Sleep apnea, unspecified: Secondary | ICD-10-CM | POA: Diagnosis not present

## 2019-12-08 DIAGNOSIS — I77810 Thoracic aortic ectasia: Secondary | ICD-10-CM | POA: Diagnosis not present

## 2019-12-08 DIAGNOSIS — I10 Essential (primary) hypertension: Secondary | ICD-10-CM | POA: Insufficient documentation

## 2019-12-08 NOTE — Progress Notes (Signed)
Echocardiogram 2D Echocardiogram has been performed.  Oneal Deputy Guillermina Shaft 12/08/2019, 1:04 PM

## 2019-12-10 ENCOUNTER — Encounter (HOSPITAL_COMMUNITY): Payer: Self-pay | Admitting: *Deleted

## 2019-12-14 ENCOUNTER — Telehealth: Payer: Self-pay

## 2019-12-14 NOTE — Telephone Encounter (Signed)
Spoke with pt regarding his virtual visit on 12/15/19. Pt stated he did not have any questions at this time. Pt confirmed virtual appt.

## 2019-12-15 ENCOUNTER — Telehealth (INDEPENDENT_AMBULATORY_CARE_PROVIDER_SITE_OTHER): Payer: Medicare PPO | Admitting: Internal Medicine

## 2019-12-15 ENCOUNTER — Telehealth: Payer: Self-pay | Admitting: *Deleted

## 2019-12-15 ENCOUNTER — Encounter: Payer: Self-pay | Admitting: Internal Medicine

## 2019-12-15 ENCOUNTER — Other Ambulatory Visit: Payer: Self-pay

## 2019-12-15 VITALS — HR 62 | Ht 71.0 in | Wt 228.0 lb

## 2019-12-15 DIAGNOSIS — I4819 Other persistent atrial fibrillation: Secondary | ICD-10-CM

## 2019-12-15 DIAGNOSIS — I4891 Unspecified atrial fibrillation: Secondary | ICD-10-CM

## 2019-12-15 DIAGNOSIS — I10 Essential (primary) hypertension: Secondary | ICD-10-CM

## 2019-12-15 DIAGNOSIS — G4733 Obstructive sleep apnea (adult) (pediatric): Secondary | ICD-10-CM

## 2019-12-15 DIAGNOSIS — Z01812 Encounter for preprocedural laboratory examination: Secondary | ICD-10-CM

## 2019-12-15 NOTE — Progress Notes (Signed)
Electrophysiology TeleHealth Note   Due to national recommendations of social distancing due to COVID 19, an audio/video telehealth visit is felt to be most appropriate for this patient at this time.  See MyChart message from today for the patient's consent to telehealth for Carlos Thomas.  Date:  12/15/2019   ID:  Carlos Thomas, DOB [birth year], MRN [id number]  Location: patient's home  Provider location:  [place]  Evaluation Performed: Follow-up visit  PCP:  Shirline Frees, MD   Electrophysiologist:  Dr Rayann Heman  Chief Complaint:  palpitations  History of Present Illness:    Carlos Thomas is a 69 y.o. male who presents via telehealth conferencing today.  I have not seen him since 2017.  He has been following with Dr Marlou Porch and the AF clinic. He has done well since his AF ablation in 2013.  He has recently developed recurrent afib.  He has required cardioversion. He has been taking flecainide but continues to have episodes.  He attributes episodes to stress or poor sleeping. Today, he denies symptoms of palpitations, chest pain, shortness of breath,  lower extremity edema, dizziness, presyncope, or syncope.  The patient is otherwise without complaint today.   Past Medical History:  Diagnosis Date  . Anticoagulant long-term use    xarelto  . Balanitis   . GERD (gastroesophageal reflux disease)   . History of adenomatous polyp of colon    2006  tubular adenoma's and hyperplastic polyp's;   2011  tubular adenoma  . History of atrial flutter    s/p ablation atrial flutter/atrial fib in 2010  . History of MRSA infection    1990's  infected wound near surgerical area   . History of repair of hiatal hernia   . HTN (hypertension)   . Obesity (BMI 30-39.9)   . OSA on CPAP    Mild OSA  per study 05-07-2012-- w AHI 6.57/hr now on 13cm H2O  . PAF (paroxysmal atrial fibrillation) [facility]) primary cardiologist-  dr Marlou Porch   ep cardiologist-  dr Rayann Heman---  s/p  ablation afib  and aflutter 2010  and  ablation afib/svt  2013  . Peripheral neuropathy, hereditary/idiopathic    bilateral feet---  neurologist- dr patel  . 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 S/P radiofrequency ablation operation for arrhythmia    01-09-2009  cardiac ablation afib/flutter;   09-13-2011  ablation afib/svt and in ther right atrium  . Wears glasses     Past Surgical History:  Procedure Laterality Date  . ATRIAL FIBRILLATION ABLATION N/A 09/13/2011   Procedure: ATRIAL FIBRILLATION ABLATION;  Surgeon: Thompson Grayer, MD;  Location: [facility] CATH LAB;  Service: Cardiovascular;  Laterality: N/A;  . CARDIAC ELECTROPHYSIOLOGY MAPPING AND ABLATION  [phone]   dr Caulin Begley   successful ablation atrial fibrillation /  atrial flutter  . CARDIAC ELECTROPHYSIOLOGY MAPPING AND ABLATION  09-13-2011   dr Rayann Heman   afib mapping/ ablation and  mapping/ablation in the right atrium  . CARDIOVASCULAR STRESS TEST  09/2006   per dr Marlou Porch note nuclear study normal w/ no ischemia  . CARDIOVERSION N/A 09/21/2015   Procedure: CARDIOVERSION;  Surgeon: Lelon Perla, MD;  Location: [facility] ENDOSCOPY;  Service: Cardiovascular;  Laterality: N/A;  . CIRCUMCISION N/A 06/27/2016   Procedure: CIRCUMCISION ADULT;  Surgeon: Carolan Clines, MD;  Location: [facility];  Service: Urology;  Laterality: N/A;  . COLONOSCOPY  last one 02/  2011  . DIRECT CURRENT CARDIOVERSION  02/02/2010   dr Leonia Reeves  . LAPAROSCOPIC CHOLECYSTECTOMY  1990's  . OPEN HIATAL HERNIA REPAIR  1970's  . TEE WITHOUT CARDIOVERSION  09/12/2011   Procedure: TRANSESOPHAGEAL ECHOCARDIOGRAM (TEE);  Surgeon: Jettie Booze, MD;  Location: [facility] ENDOSCOPY;  Service: Cardiovascular;  Laterality: N/A;  trace AR, no LA/LAA thrombus, normal LVfunction, moderate descending aortic atherosclerosis  . TEE WITHOUT CARDIOVERSION N/A 09/21/2015   Procedure: TRANSESOPHAGEAL ECHOCARDIOGRAM (TEE);  Surgeon: Lelon Perla, MD;   Location: [facility] ENDOSCOPY;  Service: Cardiovascular;  Laterality: N/A; normal LV systolic funciton,  moderate LAE,  no LAA thrombus,  trace MR and TR  . TRANSTHORACIC ECHOCARDIOGRAM  06/03/2014   ef 55-60%/  trivial MR and TR    Current Outpatient Medications  Medication Sig Dispense Refill  . diltiazem (CARDIZEM) 30 MG tablet Take 1 tablet every 4 hours AS NEEDED for AFIB heart rate >100 45 tablet 1  . diltiazem (CARTIA XT) 120 MG 24 hr capsule Taking one tablet by mouth in the am and one tablet at bedtime    . flecainide (TAMBOCOR) 100 MG tablet TAKE 1 TABLET BY MOUTH TWICE DAILY 60 tablet 11  . omeprazole (PRILOSEC) 20 MG capsule Take 20 mg by mouth every morning.     . tamsulosin (FLOMAX) 0.4 MG CAPS capsule Take 0.8 mg by mouth daily.     Alveda Reasons 20 MG TABS tablet TAKE 1 TABLET BY MOUTH DAILY WITH SUPPER 30 tablet 5   No current facility-administered medications for this visit.    Allergies:   Patient has no known allergies.   Social History:  The patient  reports that he has never smoked. He has never used smokeless tobacco. He reports that he does not drink alcohol and does not use drugs.   ROS:  Please see the history of present illness.   All other systems are personally reviewed and negative.    Exam:    Vital Signs:  Pulse 62   Ht 5\' 11"  (1.803 m)   Wt 228 lb (103.4 kg)   BMI 31.80 kg/m   Well sounding and appearing, alert and conversant, regular work of breathing,  good skin color Eyes- anicteric, neuro- grossly intact, skin- no apparent rash or lesions or cyanosis, mouth- oral mucosa is pink  Labs/Other Tests and Data Reviewed:    Recent Labs: 11/25/2019: BUN 24; Creatinine, Ser 1.18; Hemoglobin 16.5; Platelets 148; Potassium 3.8; Sodium 142   Wt Readings from Last 3 Encounters:  12/15/19 228 lb (103.4 kg)  12/01/19 226 lb 6.4 oz (102.7 kg)  11/27/19 230 lb 2.6 oz (104.4 kg)    Echo is reviewed, LA size is normal   ASSESSMENT & PLAN:    1.  Persistent  afib The patient has symptomatic, recurrent persistent atrial fibrillation. he has failed medical therapy with flecaiinide.  He did very well with ablation in 2013 but has now developed further afib.  Medical therapy has been limited by bradycardia. Chads2vasc score is 2.  he is anticoagulated with xarelto . Therapeutic strategies for afib including medicine (tikosyn) and ablation were discussed in detail with the patient today. Risk, benefits, and alternatives to EP study and radiofrequency ablation for afib were also discussed in detail today. These risks include but are not limited to stroke, bleeding, vascular damage, tamponade, perforation, damage to the esophagus, lungs, and other structures, pulmonary vein stenosis, worsening renal function, and death. The patient understands these risk and wishes to proceed.  We will therefore proceed with catheter ablation at the  next available time.  Carto, ICE, anesthesia are requested for the procedure.  Will also obtain cardiac CT prior to the procedure to exclude LAA thrombus and further evaluate atrial anatomy.  2. OSA Uses CPAP  3. HTN Stable No change required today  4. Overweight Body mass index is 31.8 kg/m. Lifestyle modification is advised  Risks, benefits and potential toxicities for medications prescribed and/or refilled reviewed with patient today.   Patient Risk:  after full review of this patients clinical status, I feel that they are at moderate risk at this time.  Today, I have spent 20 minutes with the patient with telehealth technology discussing arrhythmia management .    Army Fossa, MD  12/15/2019 10:14 AM     [facility] [address] [place] [address] [phone] (office) [phone] (fax)

## 2019-12-15 NOTE — Addendum Note (Signed)
Addended by: Trinidad Curet L on: 12/15/2019 01:00 PM   Modules accepted: Orders

## 2019-12-15 NOTE — Telephone Encounter (Signed)
-----   Message from Thompson Grayer, MD sent at 12/15/2019 10:27 AM EDT ----- Afib ablation [facility]  Cardiac CT

## 2019-12-15 NOTE — Telephone Encounter (Signed)
AFib ablation scheduled for 8/3, instructions sent via mychart, pt aware. Pt will stop by [facility] office b/t 7/5-7/23 for pre procedure blood work. Covid screening 7/31. Aware office will call to arrange pre procedure CT testing and post procedure f/u. Pt reports HR avg 50-60s at home, will not add medication prior to CT. Patient verbalized understanding and agreeable to plan.

## 2019-12-29 ENCOUNTER — Other Ambulatory Visit (HOSPITAL_COMMUNITY): Payer: Self-pay | Admitting: *Deleted

## 2019-12-29 MED ORDER — DILTIAZEM HCL ER COATED BEADS 120 MG PO CP24: 120.0000 mg | ORAL_CAPSULE | Freq: Two times a day (BID) | ORAL | 3 refills | Status: DC

## 2020-01-02 DIAGNOSIS — G4733 Obstructive sleep apnea (adult) (pediatric): Secondary | ICD-10-CM | POA: Diagnosis not present

## 2020-01-10 ENCOUNTER — Other Ambulatory Visit (HOSPITAL_COMMUNITY): Payer: Self-pay | Admitting: *Deleted

## 2020-01-10 DIAGNOSIS — I480 Paroxysmal atrial fibrillation: Secondary | ICD-10-CM

## 2020-01-10 MED ORDER — RIVAROXABAN 20 MG PO TABS: ORAL_TABLET | ORAL | 5 refills | Status: DC

## 2020-01-25 ENCOUNTER — Telehealth (HOSPITAL_COMMUNITY): Payer: Self-pay | Admitting: *Deleted

## 2020-01-25 NOTE — Telephone Encounter (Signed)
Reaching out to patient to offer assistance regarding upcoming cardiac imaging study; pt verbalizes understanding of appt date/time, parking situation and where to check in, pre-test NPO status, and verified current allergies; name and call back number provided for further questions should they arise  [facility] [phone] office [phone] cell  Pt to get blood work prior to CT scan.

## 2020-01-26 ENCOUNTER — Encounter: Payer: Self-pay | Admitting: *Deleted

## 2020-01-26 ENCOUNTER — Other Ambulatory Visit: Payer: Self-pay

## 2020-01-26 ENCOUNTER — Telehealth: Payer: Self-pay | Admitting: *Deleted

## 2020-01-26 ENCOUNTER — Other Ambulatory Visit: Payer: Medicare PPO | Admitting: *Deleted

## 2020-01-26 DIAGNOSIS — I4819 Other persistent atrial fibrillation: Secondary | ICD-10-CM

## 2020-01-26 DIAGNOSIS — Z01812 Encounter for preprocedural laboratory examination: Secondary | ICD-10-CM | POA: Diagnosis not present

## 2020-01-26 DIAGNOSIS — Z0000 Encounter for general adult medical examination without abnormal findings: Secondary | ICD-10-CM

## 2020-01-26 DIAGNOSIS — I10 Essential (primary) hypertension: Secondary | ICD-10-CM

## 2020-01-26 LAB — CBC
Hematocrit: 41.7 % (ref 37.5–51.0)
Hemoglobin: 14.6 g/dL (ref 13.0–17.7)
MCH: 32.9 pg (ref 26.6–33.0)
MCHC: 35 g/dL (ref 31.5–35.7)
MCV: 94 fL (ref 79–97)
Platelets: 141 10*3/uL — ABNORMAL LOW (ref 150–450)
RBC: 4.44 x10E6/uL (ref 4.14–5.80)
RDW: 12 % (ref 11.6–15.4)
WBC: 4.6 10*3/uL (ref 3.4–10.8)

## 2020-01-26 LAB — BASIC METABOLIC PANEL
BUN/Creatinine Ratio: 16 (ref 10–24)
BUN: 15 mg/dL (ref 8–27)
CO2: 27 mmol/L (ref 20–29)
Calcium: 9.1 mg/dL (ref 8.6–10.2)
Chloride: 104 mmol/L (ref 96–106)
Creatinine, Ser: 0.95 mg/dL (ref 0.76–1.27)
GFR calc Af Amer: 93 mL/min/{1.73_m2} (ref >59)
GFR calc non Af Amer: 81 mL/min/{1.73_m2} (ref >59)
Glucose: 99 mg/dL (ref 65–99)
Potassium: 4.5 mmol/L (ref 3.5–5.2)
Sodium: 142 mmol/L (ref 134–144)

## 2020-01-26 NOTE — Telephone Encounter (Signed)
I think it would be reasonable to proceed with stress test and late August.  We will order a pharmacologic, Lexiscan stress test to be performed.  This way your body will have time to recuperate from atrial fibrillation ablation.  Candee Furbish, MD

## 2020-01-26 NOTE — Telephone Encounter (Signed)
I think it would be reasonable to proceed with stress test and late August.  We will order a pharmacologic, Lexiscan stress test to be performed.  This way your body will have time to recuperate from atrial fibrillation ablation.  Candee Furbish, MD       Documentation

## 2020-01-27 ENCOUNTER — Ambulatory Visit (HOSPITAL_COMMUNITY)
Admission: RE | Admit: 2020-01-27 | Discharge: 2020-01-27 | Disposition: A | Discharge status: Home / Self Care | Payer: Medicare PPO | Source: Ambulatory Visit | Attending: Internal Medicine | Admitting: Internal Medicine

## 2020-01-27 DIAGNOSIS — I4891 Unspecified atrial fibrillation: Secondary | ICD-10-CM | POA: Insufficient documentation

## 2020-01-27 MED ORDER — IOHEXOL 350 MG/ML SOLN
80.0000 mL | Freq: Once | INTRAVENOUS | Status: AC | PRN
  Administered 2020-01-27: 80 mL via INTRAVENOUS

## 2020-01-29 ENCOUNTER — Other Ambulatory Visit (HOSPITAL_COMMUNITY)
Admission: RE | Admit: 2020-01-29 | Discharge: 2020-01-29 | Disposition: A | Discharge status: Home / Self Care | Payer: Medicare PPO | Source: Ambulatory Visit | Attending: Internal Medicine | Admitting: Internal Medicine

## 2020-01-29 DIAGNOSIS — Z01812 Encounter for preprocedural laboratory examination: Secondary | ICD-10-CM | POA: Insufficient documentation

## 2020-01-29 DIAGNOSIS — Z20822 Contact with and (suspected) exposure to covid-19: Secondary | ICD-10-CM | POA: Diagnosis not present

## 2020-01-29 LAB — SARS CORONAVIRUS 2 (TAT 6-24 HRS): SARS Coronavirus 2: NEGATIVE

## 2020-01-31 ENCOUNTER — Telehealth: Payer: Self-pay | Admitting: Internal Medicine

## 2020-01-31 NOTE — Telephone Encounter (Signed)
New message   Patient is calling in to go over pre procedure instructions. Please give patient a call back.

## 2020-02-01 ENCOUNTER — Ambulatory Visit (HOSPITAL_COMMUNITY): Payer: Medicare PPO | Admitting: Certified Registered"

## 2020-02-01 ENCOUNTER — Ambulatory Visit (HOSPITAL_COMMUNITY)
Admission: RE | Admit: 2020-02-01 | Discharge: 2020-02-01 | Disposition: A | Discharge status: Home / Self Care | Payer: Medicare PPO | Attending: Internal Medicine | Admitting: Internal Medicine

## 2020-02-01 ENCOUNTER — Encounter (HOSPITAL_COMMUNITY): Payer: Self-pay | Admitting: Internal Medicine

## 2020-02-01 ENCOUNTER — Other Ambulatory Visit: Payer: Self-pay

## 2020-02-01 ENCOUNTER — Encounter (HOSPITAL_COMMUNITY)
Admission: RE | Disposition: A | Discharge status: Home / Self Care | Payer: Self-pay | Source: Home / Self Care | Attending: Internal Medicine

## 2020-02-01 DIAGNOSIS — Z7901 Long term (current) use of anticoagulants: Secondary | ICD-10-CM | POA: Diagnosis not present

## 2020-02-01 DIAGNOSIS — E669 Obesity, unspecified: Secondary | ICD-10-CM | POA: Diagnosis not present

## 2020-02-01 DIAGNOSIS — I4819 Other persistent atrial fibrillation: Secondary | ICD-10-CM | POA: Insufficient documentation

## 2020-02-01 DIAGNOSIS — I480 Paroxysmal atrial fibrillation: Secondary | ICD-10-CM | POA: Diagnosis not present

## 2020-02-01 DIAGNOSIS — Z6831 Body mass index (BMI) 31.0-31.9, adult: Secondary | ICD-10-CM | POA: Insufficient documentation

## 2020-02-01 DIAGNOSIS — G4733 Obstructive sleep apnea (adult) (pediatric): Secondary | ICD-10-CM | POA: Diagnosis not present

## 2020-02-01 DIAGNOSIS — K219 Gastro-esophageal reflux disease without esophagitis: Secondary | ICD-10-CM | POA: Diagnosis not present

## 2020-02-01 DIAGNOSIS — Z79899 Other long term (current) drug therapy: Secondary | ICD-10-CM | POA: Insufficient documentation

## 2020-02-01 DIAGNOSIS — G629 Polyneuropathy, unspecified: Secondary | ICD-10-CM | POA: Diagnosis not present

## 2020-02-01 DIAGNOSIS — I10 Essential (primary) hypertension: Secondary | ICD-10-CM | POA: Diagnosis not present

## 2020-02-01 DIAGNOSIS — R001 Bradycardia, unspecified: Secondary | ICD-10-CM | POA: Diagnosis not present

## 2020-02-01 HISTORY — PX: ATRIAL FIBRILLATION ABLATION: EP1191

## 2020-02-01 LAB — POCT ACTIVATED CLOTTING TIME: Activated Clotting Time: 312 seconds

## 2020-02-01 SURGERY — ATRIAL FIBRILLATION ABLATION
Anesthesia: General

## 2020-02-01 MED ORDER — PROTAMINE SULFATE 10 MG/ML IV SOLN
INTRAVENOUS | Status: DC | PRN
Start: 2020-02-01 — End: 2020-02-01
  Administered 2020-02-01: 40 mg via INTRAVENOUS

## 2020-02-01 MED ORDER — PHENYLEPHRINE HCL-NACL 10-0.9 MG/250ML-% IV SOLN
INTRAVENOUS | Status: DC | PRN
  Administered 2020-02-01: 30 ug/min via INTRAVENOUS

## 2020-02-01 MED ORDER — ACETAMINOPHEN 325 MG PO TABS
650.0000 mg | ORAL_TABLET | ORAL | Status: DC | PRN
Start: 2020-02-01 — End: 2020-02-01

## 2020-02-01 MED ORDER — HYDROCODONE-ACETAMINOPHEN 5-325 MG PO TABS
1.0000 | ORAL_TABLET | ORAL | Status: DC | PRN
Start: 2020-02-01 — End: 2020-02-01

## 2020-02-01 MED ORDER — SODIUM CHLORIDE 0.9 % IV SOLN: INTRAVENOUS | Status: DC

## 2020-02-01 MED ORDER — PROPOFOL 10 MG/ML IV BOLUS
INTRAVENOUS | Status: DC | PRN
  Administered 2020-02-01: 170 mg via INTRAVENOUS

## 2020-02-01 MED ORDER — HEPARIN SODIUM (PORCINE) 1000 UNIT/ML IJ SOLN
INTRAMUSCULAR | Status: DC | PRN
  Administered 2020-02-01: 1000 [IU] via INTRAVENOUS
  Administered 2020-02-01: 14000 [IU] via INTRAVENOUS

## 2020-02-01 MED ORDER — HEPARIN (PORCINE) IN NACL 1000-0.9 UT/500ML-% IV SOLN
INTRAVENOUS | Status: DC | PRN
  Administered 2020-02-01: 500 mL

## 2020-02-01 MED ORDER — HEPARIN SODIUM (PORCINE) 1000 UNIT/ML IJ SOLN
INTRAMUSCULAR | Status: AC
  Filled 2020-02-01: qty 2

## 2020-02-01 MED ORDER — HEPARIN SODIUM (PORCINE) 1000 UNIT/ML IJ SOLN
INTRAMUSCULAR | Status: DC | PRN
  Administered 2020-02-01: 2000 [IU] via INTRAVENOUS

## 2020-02-01 MED ORDER — FENTANYL CITRATE (PF) 100 MCG/2ML IJ SOLN
INTRAMUSCULAR | Status: DC | PRN
  Administered 2020-02-01 (×4): 50 ug via INTRAVENOUS

## 2020-02-01 MED ORDER — SODIUM CHLORIDE 0.9% FLUSH: 3.0000 mL | INTRAVENOUS | Status: DC | PRN

## 2020-02-01 MED ORDER — ONDANSETRON HCL 4 MG/2ML IJ SOLN: 4.0000 mg | Freq: Four times a day (QID) | INTRAMUSCULAR | Status: DC | PRN

## 2020-02-01 MED ORDER — EPHEDRINE SULFATE-NACL 50-0.9 MG/10ML-% IV SOSY
PREFILLED_SYRINGE | INTRAVENOUS | Status: DC | PRN
  Administered 2020-02-01: 10 mg via INTRAVENOUS
  Administered 2020-02-01 (×3): 5 mg via INTRAVENOUS

## 2020-02-01 MED ORDER — DEXAMETHASONE SODIUM PHOSPHATE 10 MG/ML IJ SOLN
INTRAMUSCULAR | Status: DC | PRN
  Administered 2020-02-01: 10 mg via INTRAVENOUS

## 2020-02-01 MED ORDER — SUGAMMADEX SODIUM 200 MG/2ML IV SOLN
INTRAVENOUS | Status: DC | PRN
  Administered 2020-02-01: 50 mg via INTRAVENOUS
  Administered 2020-02-01: 200 mg via INTRAVENOUS

## 2020-02-01 MED ORDER — LIDOCAINE 2% (20 MG/ML) 5 ML SYRINGE
INTRAMUSCULAR | Status: DC | PRN
  Administered 2020-02-01: 50 mg via INTRAVENOUS

## 2020-02-01 MED ORDER — ROCURONIUM BROMIDE 100 MG/10ML IV SOLN
INTRAVENOUS | Status: DC | PRN
  Administered 2020-02-01: 60 mg via INTRAVENOUS
  Administered 2020-02-01: 15 mg via INTRAVENOUS
  Administered 2020-02-01: 20 mg via INTRAVENOUS

## 2020-02-01 MED ORDER — SODIUM CHLORIDE 0.9 % IV SOLN: 250.0000 mL | INTRAVENOUS | Status: DC | PRN

## 2020-02-01 MED ORDER — ONDANSETRON HCL 4 MG/2ML IJ SOLN
INTRAMUSCULAR | Status: DC | PRN
  Administered 2020-02-01: 4 mg via INTRAVENOUS

## 2020-02-01 MED ORDER — LACTATED RINGERS IV SOLN: INTRAVENOUS | Status: DC | PRN

## 2020-02-01 MED ORDER — SODIUM CHLORIDE 0.9% FLUSH: 3.0000 mL | Freq: Two times a day (BID) | INTRAVENOUS | Status: DC

## 2020-02-01 MED ORDER — FENTANYL CITRATE (PF) 100 MCG/2ML IJ SOLN
INTRAMUSCULAR | Status: AC
  Filled 2020-02-01: qty 4

## 2020-02-01 SURGICAL SUPPLY — 20 items
BLANKET WARM UNDERBOD FULL ACC (MISCELLANEOUS) ×3 IMPLANT
CATH 8FR REPROCESSED SOUNDSTAR (CATHETERS) ×3 IMPLANT
CATH 8FR SOUNDSTAR REPROCESSED (CATHETERS) IMPLANT
CATH MAPPNG PENTARAY F 2-6-2MM (CATHETERS) IMPLANT
CATH SMTCH THERMOCOOL SF DF (CATHETERS) ×2 IMPLANT
CATH WEBSTER BI DIR CS D-F CRV (CATHETERS) ×2 IMPLANT
COVER SWIFTLINK CONNECTOR (BAG) ×3 IMPLANT
DEVICE CLOSURE PERCLS PRGLD 6F (VASCULAR PRODUCTS) IMPLANT
NDL BAYLIS TRANSSEPTAL 71CM (NEEDLE) IMPLANT
NEEDLE BAYLIS TRANSSEPTAL 71CM (NEEDLE) ×3 IMPLANT
PACK EP LATEX FREE (CUSTOM PROCEDURE TRAY) ×3
PACK EP LF (CUSTOM PROCEDURE TRAY) ×1 IMPLANT
PAD PRO RADIOLUCENT 2001M-C (PAD) ×3 IMPLANT
PATCH CARTO3 (PAD) ×2 IMPLANT
PENTARAY F 2-6-2MM (CATHETERS) ×3
PERCLOSE PROGLIDE 6F (VASCULAR PRODUCTS) ×9
SHEATH PINNACLE 7F 10CM (SHEATH) ×4 IMPLANT
SHEATH PINNACLE 9F 10CM (SHEATH) ×2 IMPLANT
SHEATH SWARTZ TS SL2 63CM 8.5F (SHEATH) ×2 IMPLANT
TUBING SMART ABLATE COOLFLOW (TUBING) ×2 IMPLANT

## 2020-02-01 NOTE — Anesthesia Preprocedure Evaluation (Addendum)

## 2020-02-01 NOTE — H&P (Signed)
Chief Complaint:  palpitations  History of Present Illness:    Carlos Thomas is a 69 y.o. male who presents for afib ablation.  He has done well since his AF ablation in 2013.  He has recently developed recurrent afib requiring cardioversion. He has been taking flecainide but continues to have episodes.  Today, he denies symptoms of palpitations, chest pain, shortness of breath,  lower extremity edema, dizziness, presyncope, or syncope.  The patient is otherwise without complaint today.       Past Medical History:  Diagnosis Date  . Anticoagulant long-term use    xarelto  . Balanitis   . GERD (gastroesophageal reflux disease)   . History of adenomatous polyp of colon    2006  tubular adenoma's and hyperplastic polyp's;   2011  tubular adenoma  . History of atrial flutter    s/p ablation atrial flutter/atrial fib in 2010  . History of MRSA infection    1990's  infected wound near surgerical area   . History of repair of hiatal hernia   . HTN (hypertension)   . Obesity (BMI 30-39.9)   . OSA on CPAP    Mild OSA  per study 05-07-2012-- w AHI 6.57/hr now on 13cm H2O  . PAF (paroxysmal atrial fibrillation) [facility]) primary cardiologist-  dr Marlou Porch   ep cardiologist-  dr Rayann Heman---  s/p  ablation afib and aflutter 2010  and  ablation afib/svt  2013  . Peripheral neuropathy, hereditary/idiopathic    bilateral feet---  neurologist- dr patel  . 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 S/P radiofrequency ablation operation for arrhythmia    01-09-2009  cardiac ablation afib/flutter;   09-13-2011  ablation afib/svt and in ther right atrium  . Wears glasses          Past Surgical History:  Procedure Laterality Date  . ATRIAL FIBRILLATION ABLATION N/A 09/13/2011   Procedure: ATRIAL FIBRILLATION ABLATION;  Surgeon: Thompson Grayer, MD;  Location: [facility] CATH LAB;  Service: Cardiovascular;  Laterality: N/A;  . CARDIAC ELECTROPHYSIOLOGY MAPPING  AND ABLATION  [phone]   dr Yosselin Zoeller   successful ablation atrial fibrillation /  atrial flutter  . CARDIAC ELECTROPHYSIOLOGY MAPPING AND ABLATION  09-13-2011   dr Rayann Heman   afib mapping/ ablation and  mapping/ablation in the right atrium  . CARDIOVASCULAR STRESS TEST  09/2006   per dr Marlou Porch note nuclear study normal w/ no ischemia  . CARDIOVERSION N/A 09/21/2015   Procedure: CARDIOVERSION;  Surgeon: Lelon Perla, MD;  Location: [facility] ENDOSCOPY;  Service: Cardiovascular;  Laterality: N/A;  . CIRCUMCISION N/A 06/27/2016   Procedure: CIRCUMCISION ADULT;  Surgeon: Carolan Clines, MD;  Location: [facility];  Service: Urology;  Laterality: N/A;  . COLONOSCOPY  last one 02/  2011  . DIRECT CURRENT CARDIOVERSION  02/02/2010   dr Leonia Reeves  . LAPAROSCOPIC CHOLECYSTECTOMY  1990's  . OPEN HIATAL HERNIA REPAIR  1970's  . TEE WITHOUT CARDIOVERSION  09/12/2011   Procedure: TRANSESOPHAGEAL ECHOCARDIOGRAM (TEE);  Surgeon: Jettie Booze, MD;  Location: [facility] ENDOSCOPY;  Service: Cardiovascular;  Laterality: N/A;  trace AR, no LA/LAA thrombus, normal LVfunction, moderate descending aortic atherosclerosis  . TEE WITHOUT CARDIOVERSION N/A 09/21/2015   Procedure: TRANSESOPHAGEAL ECHOCARDIOGRAM (TEE);  Surgeon: Lelon Perla, MD;  Location: [facility] ENDOSCOPY;  Service: Cardiovascular;  Laterality: N/A; normal LV systolic funciton,  moderate LAE,  no LAA thrombus,  trace MR and TR  . TRANSTHORACIC ECHOCARDIOGRAM  06/03/2014   ef 55-60%/  trivial MR and TR          Current Outpatient Medications  Medication Sig Dispense Refill  . diltiazem (CARDIZEM) 30 MG tablet Take 1 tablet every 4 hours AS NEEDED for AFIB heart rate >100 45 tablet 1  . diltiazem (CARTIA XT) 120 MG 24 hr capsule Taking one tablet by mouth in the am and one tablet at bedtime    . flecainide (TAMBOCOR) 100 MG tablet TAKE 1 TABLET BY MOUTH TWICE DAILY 60 tablet 11  . omeprazole (PRILOSEC) 20 MG capsule  Take 20 mg by mouth every morning.     . tamsulosin (FLOMAX) 0.4 MG CAPS capsule Take 0.8 mg by mouth daily.     Alveda Reasons 20 MG TABS tablet TAKE 1 TABLET BY MOUTH DAILY WITH SUPPER 30 tablet 5   No current facility-administered medications for this visit.    Allergies:   Patient has no known allergies.   Social History:  The patient  reports that he has never smoked. He has never used smokeless tobacco. He reports that he does not drink alcohol and does not use drugs.   ROS:  Please see the history of present illness.   All other systems are personally reviewed and negative.   Physical Exam: Vitals:   02/01/20 0701  BP: (!) 198/70  Pulse: 60  Resp: 17  Temp: 97.7 F (36.5 C)  TempSrc: Oral  SpO2: 99%  Weight: 103.4 kg  Height: 5\' 11"  (1.803 m)    GEN- The patient is well appearing, alert and oriented x 3 today.   Head- normocephalic, atraumatic Eyes-  Sclera clear, conjunctiva pink Ears- hearing intact Oropharynx- clear Neck- supple, Lungs-   normal work of breathing Heart- Regular rate and rhythm  GI- soft  Extremities- no clubbing, cyanosis, or edema, groin is without hematoma    Labs/Other Tests and Data Reviewed:    Recent Labs: 11/25/2019: BUN 24; Creatinine, Ser 1.18; Hemoglobin 16.5; Platelets 148; Potassium 3.8; Sodium 142      Wt Readings from Last 3 Encounters:  12/15/19 228 lb (103.4 kg)  12/01/19 226 lb 6.4 oz (102.7 kg)  11/27/19 230 lb 2.6 oz (104.4 kg)    Echo is reviewed, LA size is normal   ASSESSMENT & PLAN:    1.  Persistent afib The patient has symptomatic, recurrent persistent atrial fibrillation. he has failed medical therapy with flecaiinide.  He did very well with ablation in 2013 but has now developed further afib.  Medical therapy has been limited by bradycardia. Chads2vasc score is 2.  he is anticoagulated with xarelto .  Risk, benefits, and alternatives to EP study and radiofrequency ablation for afib were again  discussed in detail today. These risks include but are not limited to stroke, bleeding, vascular damage, tamponade, perforation, damage to the esophagus, lungs, and other structures, pulmonary vein stenosis, worsening renal function, and death. The patient understands these risk and wishes to proceed.  Cardiac CT reviewed with the patient.  He reports compliance with xarelto without interruption.  Thompson Grayer MD, [facility] 02/01/2020 7:25 AM

## 2020-02-01 NOTE — Progress Notes (Signed)
Pt c/o bilateral eye dryness, eyes are not red nor swollen, using warm moist compresses with moderate relief.

## 2020-02-01 NOTE — Anesthesia Postprocedure Evaluation (Signed)
Anesthesia Post Note  Patient: Carlos Thomas  Procedure(s) Performed: ATRIAL FIBRILLATION ABLATION (N/A )     Patient location during evaluation: Cath Lab Anesthesia Type: General Level of consciousness: awake and alert Pain management: pain level controlled Vital Signs Assessment: post-procedure vital signs reviewed and stable Respiratory status: spontaneous breathing, nonlabored ventilation, respiratory function stable and patient connected to nasal cannula oxygen Cardiovascular status: blood pressure returned to baseline and stable Postop Assessment: no apparent nausea or vomiting Anesthetic complications: no   No complications documented.  Last Vitals:  Vitals:   02/01/20 1315 02/01/20 1330  BP: (!) 131/59 (!) 138/58  Pulse: 63 (!) 59  Resp: 18 13  Temp:    SpO2: 95% 97%    Last Pain:  Vitals:   02/01/20 1309  TempSrc:   PainSc: 0-No pain                 Xavian Hardcastle COKER

## 2020-02-01 NOTE — Transfer of Care (Signed)
Immediate Anesthesia Transfer of Care Note  Patient: Carlos Thomas  Procedure(s) Performed: ATRIAL FIBRILLATION ABLATION (N/A )  Patient Location: PACU  Anesthesia Type:General  Level of Consciousness: oriented, drowsy and patient cooperative  Airway & Oxygen Therapy: Patient Spontanous Breathing and Patient connected to face mask oxygen  Post-op Assessment: Report given to RN and Post -op Vital signs reviewed and stable  Post vital signs: Reviewed and stable  Last Vitals:  Vitals Value Taken Time  BP 137/63 02/01/20 1220  Temp    Pulse 70 02/01/20 1221  Resp 13 02/01/20 1221  SpO2 97 % 02/01/20 1221  Vitals shown include unvalidated device data.  Last Pain:  Vitals:   02/01/20 0710  TempSrc:   PainSc: 0-No pain      Patients Stated Pain Goal: 3 (54/56/25 6389)  Complications: No complications documented.

## 2020-02-01 NOTE — Anesthesia Procedure Notes (Signed)
Procedure Name: Intubation Date/Time: 02/01/2020 10:14 AM Performed by: Gwyndolyn Saxon, CRNA Pre-anesthesia Checklist: Patient identified, Suction available, Patient being monitored, Timeout performed and Emergency Drugs available Patient Re-evaluated:Patient Re-evaluated prior to induction Oxygen Delivery Method: Circle system utilized Preoxygenation: Pre-oxygenation with 100% oxygen Induction Type: IV induction Ventilation: Mask ventilation without difficulty Laryngoscope Size: Miller and 2 Grade View: Grade I Tube size: 7.5 mm Airway Equipment and Method: Stylet Placement Confirmation: positive ETCO2,  CO2 detector and breath sounds checked- equal and bilateral Secured at: 22 cm Tube secured with: Tape Dental Injury: Teeth and Oropharynx as per pre-operative assessment

## 2020-02-01 NOTE — Discharge Instructions (Signed)
Post procedure care instructions No driving for 4 days. No lifting over 5 lbs for 1 week. No vigorous or sexual activity for 1 week. You may return to work/your usual activities on 02/08/2020. Keep procedure site clean & dry. If you notice increased pain, swelling, bleeding or pus, call/return!  You may shower, but no soaking baths/hot tubs/pools for 1 week.      Cardiac Ablation, Care After  This sheet gives you information about how to care for yourself after your procedure. Your health care provider may also give you more specific instructions. If you have problems or questions, contact your health care provider. What can I expect after the procedure? After the procedure, it is common to have:  Bruising around your puncture site.  Tenderness around your puncture site.  Skipped heartbeats.  Tiredness (fatigue).  Follow these instructions at home: Puncture site care   Follow instructions from your health care provider about how to take care of your puncture site. Make sure you: ? If present, leave stitches (sutures), skin glue, or adhesive strips in place. These skin closures may need to stay in place for up to 2 weeks. If adhesive strip edges start to loosen and curl up, you may trim the loose edges. Do not remove adhesive strips completely unless your health care provider tells you to do that.  Check your puncture site every day for signs of infection. Check for: ? Redness, swelling, or pain. ? Fluid or blood. If your puncture site starts to bleed, lie down on your back, apply firm pressure to the area, and contact your health care provider. ? Warmth. ? Pus or a bad smell. Driving  Do not drive for at least 4 days after your procedure or however long your health care provider recommends. (Do not resume driving if you have previously been instructed not to drive for other health reasons.)  Do not drive or use heavy machinery while taking prescription pain  medicine. Activity  Avoid activities that take a lot of effort for at least 7 days after your procedure.  Do not lift anything that is heavier than 5 lb (4.5 kg) for one week.   No sexual activity for 1 week.   Return to your normal activities as told by your health care provider. Ask your health care provider what activities are safe for you. General instructions  Take over-the-counter and prescription medicines only as told by your health care provider.  Do not use any products that contain nicotine or tobacco, such as cigarettes and e-cigarettes. If you need help quitting, ask your health care provider.  You may shower after 24 hours, but Do not take baths, swim, or use a hot tub for 1 week.   Do not drink alcohol for 24 hours after your procedure.  Keep all follow-up visits as told by your health care provider. This is important. Contact a health care provider if:  You have redness, mild swelling, or pain around your puncture site.  You have fluid or blood coming from your puncture site that stops after applying firm pressure to the area.  Your puncture site feels warm to the touch.  You have pus or a bad smell coming from your puncture site.  You have a fever.  You have chest pain or discomfort that spreads to your neck, jaw, or arm.  You are sweating a lot.  You feel nauseous.  You have a fast or irregular heartbeat.  You have shortness of breath.  You are  dizzy or light-headed and feel the need to lie down.  You have pain or numbness in the arm or leg closest to your puncture site. Get help right away if:  Your puncture site suddenly swells.  Your puncture site is bleeding and the bleeding does not stop after applying firm pressure to the area. These symptoms may represent a serious problem that is an emergency. Do not wait to see if the symptoms will go away. Get medical help right away. Call your local emergency services (911 in the [place] Do not drive  yourself to the hospital. Summary  After the procedure, it is normal to have bruising and tenderness at the puncture site in your groin, neck, or forearm.  Check your puncture site every day for signs of infection.  Get help right away if your puncture site is bleeding and the bleeding does not stop after applying firm pressure to the area. This is a medical emergency. This information is not intended to replace advice given to you by your health care provider. Make sure you discuss any questions you have with your health care provider.

## 2020-02-02 ENCOUNTER — Encounter (HOSPITAL_COMMUNITY): Payer: Self-pay | Admitting: Internal Medicine

## 2020-02-02 DIAGNOSIS — G4733 Obstructive sleep apnea (adult) (pediatric): Secondary | ICD-10-CM | POA: Diagnosis not present

## 2020-02-10 ENCOUNTER — Telehealth: Payer: Self-pay | Admitting: Internal Medicine

## 2020-02-10 DIAGNOSIS — G4733 Obstructive sleep apnea (adult) (pediatric): Secondary | ICD-10-CM | POA: Diagnosis not present

## 2020-02-10 NOTE — Telephone Encounter (Signed)
    Pt returning call from Otila Kluver to get lab results

## 2020-02-11 NOTE — Telephone Encounter (Signed)
The patient has been notified of the result and verbalized understanding.  All questions (if any) were answered.  Answered post procedure questions as well.  Darrell Jewel, RN 02/11/2020 12:46 PM

## 2020-02-24 ENCOUNTER — Telehealth (HOSPITAL_COMMUNITY): Payer: Self-pay | Admitting: *Deleted

## 2020-02-24 NOTE — Telephone Encounter (Signed)
Patient given detailed instructions per Myocardial Perfusion Study Information Sheet for the test on 02/28/20 at 10:00. Patient notified to arrive 15 minutes early and that it is imperative to arrive on time for appointment to keep from having the test rescheduled.  If you need to cancel or reschedule your appointment, please call the office within 24 hours of your appointment. . Patient verbalized understanding.Carlos Thomas

## 2020-02-28 ENCOUNTER — Ambulatory Visit (HOSPITAL_COMMUNITY): Payer: Medicare PPO | Attending: Cardiology

## 2020-02-28 ENCOUNTER — Other Ambulatory Visit: Payer: Self-pay

## 2020-02-28 DIAGNOSIS — I10 Essential (primary) hypertension: Secondary | ICD-10-CM | POA: Diagnosis not present

## 2020-02-28 DIAGNOSIS — Z0000 Encounter for general adult medical examination without abnormal findings: Secondary | ICD-10-CM | POA: Insufficient documentation

## 2020-02-28 DIAGNOSIS — I4819 Other persistent atrial fibrillation: Secondary | ICD-10-CM | POA: Insufficient documentation

## 2020-02-28 LAB — MYOCARDIAL PERFUSION IMAGING
LV dias vol: 121 mL (ref 62–150)
LV sys vol: 49 mL
Peak HR: 67 {beats}/min
Rest HR: 48 {beats}/min
SDS: 1
SRS: 0
SSS: 1
TID: 0.99

## 2020-02-28 MED ORDER — TECHNETIUM TC 99M TETROFOSMIN IV KIT
10.6000 | PACK | Freq: Once | INTRAVENOUS | Status: AC | PRN
  Administered 2020-02-28: 10.6 via INTRAVENOUS
  Filled 2020-02-28: qty 11

## 2020-02-28 MED ORDER — TECHNETIUM TC 99M TETROFOSMIN IV KIT
31.4000 | PACK | Freq: Once | INTRAVENOUS | Status: AC | PRN
  Administered 2020-02-28: 31.4 via INTRAVENOUS
  Filled 2020-02-28: qty 32

## 2020-02-28 MED ORDER — REGADENOSON 0.4 MG/5ML IV SOLN
0.4000 mg | Freq: Once | INTRAVENOUS | Status: AC
  Administered 2020-02-28: 0.4 mg via INTRAVENOUS

## 2020-02-29 ENCOUNTER — Ambulatory Visit (HOSPITAL_COMMUNITY)
Admission: RE | Admit: 2020-02-29 | Discharge: 2020-02-29 | Disposition: A | Discharge status: Home / Self Care | Payer: Medicare PPO | Source: Ambulatory Visit | Attending: Nurse Practitioner | Admitting: Nurse Practitioner

## 2020-02-29 ENCOUNTER — Encounter (HOSPITAL_COMMUNITY): Payer: Self-pay | Admitting: Nurse Practitioner

## 2020-02-29 VITALS — BP 146/68 | HR 56 | Ht 71.0 in | Wt 233.4 lb

## 2020-02-29 DIAGNOSIS — Z888 Allergy status to other drugs, medicaments and biological substances status: Secondary | ICD-10-CM | POA: Diagnosis not present

## 2020-02-29 DIAGNOSIS — Z7901 Long term (current) use of anticoagulants: Secondary | ICD-10-CM | POA: Insufficient documentation

## 2020-02-29 DIAGNOSIS — I10 Essential (primary) hypertension: Secondary | ICD-10-CM | POA: Insufficient documentation

## 2020-02-29 DIAGNOSIS — G629 Polyneuropathy, unspecified: Secondary | ICD-10-CM | POA: Diagnosis not present

## 2020-02-29 DIAGNOSIS — E669 Obesity, unspecified: Secondary | ICD-10-CM | POA: Insufficient documentation

## 2020-02-29 DIAGNOSIS — K219 Gastro-esophageal reflux disease without esophagitis: Secondary | ICD-10-CM | POA: Insufficient documentation

## 2020-02-29 DIAGNOSIS — Z79899 Other long term (current) drug therapy: Secondary | ICD-10-CM | POA: Insufficient documentation

## 2020-02-29 DIAGNOSIS — Z86010 Personal history of colonic polyps: Secondary | ICD-10-CM | POA: Diagnosis not present

## 2020-02-29 DIAGNOSIS — I480 Paroxysmal atrial fibrillation: Secondary | ICD-10-CM | POA: Diagnosis not present

## 2020-02-29 DIAGNOSIS — G4733 Obstructive sleep apnea (adult) (pediatric): Secondary | ICD-10-CM | POA: Diagnosis not present

## 2020-02-29 DIAGNOSIS — Z8249 Family history of ischemic heart disease and other diseases of the circulatory system: Secondary | ICD-10-CM | POA: Insufficient documentation

## 2020-02-29 DIAGNOSIS — D6869 Other thrombophilia: Secondary | ICD-10-CM | POA: Diagnosis not present

## 2020-02-29 DIAGNOSIS — Z9049 Acquired absence of other specified parts of digestive tract: Secondary | ICD-10-CM | POA: Insufficient documentation

## 2020-02-29 DIAGNOSIS — I4819 Other persistent atrial fibrillation: Secondary | ICD-10-CM | POA: Diagnosis not present

## 2020-02-29 DIAGNOSIS — Z8614 Personal history of Methicillin resistant Staphylococcus aureus infection: Secondary | ICD-10-CM | POA: Diagnosis not present

## 2020-02-29 NOTE — Progress Notes (Signed)
Primary Care Physician: Shirline Frees, MD Referring Physician: [facility] ER f/u EP: Dr. Loney Hering Carlos Thomas is a 69 y.o. male with a h/o afib s/p w ablations, 2010 and 2013 that intermittently requires cardioversion, on flecainide. Pt was involved in a traffic situation that escalated his HR on Friday and immediately felt his heart go into afib. He thought he took an extra flecainide but by mistake, he took a nausea pill. He went to the  ER on Saturday but the ER doc did not want to cardiovert as he was rate controlled. He returned on Sunday to the ER with afib around 120 bpm and received successful  cardioversion.  On last visit in April 2019, he continued to maintain S brady at 50 bpm. Continues on xarelto 20 mg daily. He was given an extra 120 mg diltiazem to add to his 240 mg daily Cardizem in the ER.  F/u afib clinic, 12/6. He is in SR and has not had any afib but has neuropathy and his neurologist said that flecainide may be worsening his symptoms. He says that he has had neuropathy  for awhile and the symptoms are stable. He does not want to stop flecainide but wanted to know our experience with this.   F/u in afib clinic, 09/28/19. I have not seen pt since 2019. He decided to stay on the flecainide after his last visit as  we discussed staying on flecainide despite it may be worsening his peripheral neuropathy as mentioned by his neurologist. He states that it has stayed about the same and he prefers to continue its use. He has not noted any afib. EKG shows sinus brady at 46 bpm, LAFB.   F/u in afib clinic, 5/27. He had a very busy day yesterday, doing multiple activities outside around a lot of pollen. He coughed  hard last pm and wento out of rhythm. He took his Cardizem and his flecainide and when he was still out of rhythm a couple hours later, he went to [facility]. After unsuccessfully  trying to convert with IV Cardizem, he was successfully cardioverted. He did have some  issues  with hypotension and bradycardia afterward. He did not leave there until 7:30 this am. Now his BP is normal and he remains  in sinus brady at 53 bpm. He is very tired for being up all night.   F/u in afib clinic, 12/01/19. He had another cardioversion 5/29 and is quite upset that he has had 2 episodes in the last few days. We discussed that he did not go to have an urgent cardioversion if he was stable. He does come out of cardioversion bradycardic and with low BP with an extra 120 mg Cardizem po  or at the time of his first DCCV, Cardizem was used first IV. We did discuss that he could use 30 mg Cardizem that may convert without going to the  ER and not drop his BP/HR as much. He has himself gone back up to 120 mg bid of Cardizem but have asked him not to increase further as he had HR's in the 40's with higher doses of CCB. Now HR mid 50's. He had an atrial flutter ablation in 2010 and an afib ablation in 2013 and may be time to see Dr. Rayann Heman  for an repeat afib ablation as he has had a very long time in [place]. He will need updated echo. The other option would be to change antiarrythmic to Tikosyn.  F/u in afib ablation 02/29/20.He is now one month s/p ablation. He only has noted one episode afib and that was drinking regular coffee given to him in a restaurant when he had asked for decaf. He took an extra cardizem 30 mg and it calmed down quickly. He continues on flecainide/cardizem as well as xarelto 20 mg daily for a CHA2DS2VASc dcore of at least 2.   Today, he denies symptoms of palpitations, chest pain, shortness of breath, orthopnea, PND, lower extremity edema, dizziness, presyncope, syncope, or neurologic sequela. The patient is tolerating medications without difficulties and is otherwise without complaint today.   Past Medical History:  Diagnosis Date  . Anticoagulant long-term use    xarelto  . Balanitis   . GERD (gastroesophageal reflux disease)   . History of adenomatous polyp of colon    2006   tubular adenoma's and hyperplastic polyp's;   2011  tubular adenoma  . History of atrial flutter    s/p ablation atrial flutter/atrial fib in 2010  . History of MRSA infection    1990's  infected wound near surgerical area   . History of repair of hiatal hernia   . HTN (hypertension)   . Obesity (BMI 30-39.9)   . OSA on CPAP    Mild OSA  per study 05-07-2012-- w AHI 6.57/hr now on 13cm H2O  . PAF (paroxysmal atrial fibrillation) [facility]) primary cardiologist-  dr Marlou Porch   ep cardiologist-  dr Rayann Heman---  s/p  ablation afib and aflutter 2010  and  ablation afib/svt  2013  . Peripheral neuropathy, hereditary/idiopathic    bilateral feet---  neurologist- dr patel  . 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 S/P radiofrequency ablation operation for arrhythmia    01-09-2009  cardiac ablation afib/flutter;   09-13-2011  ablation afib/svt and in ther right atrium  . Wears glasses    Past Surgical History:  Procedure Laterality Date  . ATRIAL FIBRILLATION ABLATION N/A 09/13/2011   Procedure: ATRIAL FIBRILLATION ABLATION;  Surgeon: Thompson Grayer, MD;  Location: [facility] CATH LAB;  Service: Cardiovascular;  Laterality: N/A;  . ATRIAL FIBRILLATION ABLATION N/A 02/01/2020   Procedure: ATRIAL FIBRILLATION ABLATION;  Surgeon: Thompson Grayer, MD;  Location: [place] CV LAB;  Service: Cardiovascular;  Laterality: N/A;  . CARDIAC ELECTROPHYSIOLOGY MAPPING AND ABLATION  [phone]   dr allred   successful ablation atrial fibrillation /  atrial flutter  . CARDIAC ELECTROPHYSIOLOGY MAPPING AND ABLATION  09-13-2011   dr Rayann Heman   afib mapping/ ablation and  mapping/ablation in the right atrium  . CARDIOVASCULAR STRESS TEST  09/2006   per dr Marlou Porch note nuclear study normal w/ no ischemia  . CARDIOVERSION N/A 09/21/2015   Procedure: CARDIOVERSION;  Surgeon: Lelon Perla, MD;  Location: [facility] ENDOSCOPY;  Service: Cardiovascular;  Laterality: N/A;  . CIRCUMCISION N/A 06/27/2016   Procedure:  CIRCUMCISION ADULT;  Surgeon: Carolan Clines, MD;  Location: [facility];  Service: Urology;  Laterality: N/A;  . COLONOSCOPY  last one 02/  2011  . DIRECT CURRENT CARDIOVERSION  02/02/2010   dr Leonia Reeves  . LAPAROSCOPIC CHOLECYSTECTOMY  1990's  . OPEN HIATAL HERNIA REPAIR  1970's  . TEE WITHOUT CARDIOVERSION  09/12/2011   Procedure: TRANSESOPHAGEAL ECHOCARDIOGRAM (TEE);  Surgeon: Jettie Booze, MD;  Location[facility] ENDOSCOPY;  Service: Cardiovascular;  Laterality: N/A;  trace AR, no LA/LAA thrombus, normal LVfunction, moderate descending aortic atherosclerosis  . TEE WITHOUT CARDIOVERSION N/A 09/21/2015   Procedure: TRANSESOPHAGEAL ECHOCARDIOGRAM (TEE);  Surgeon: Lelon Perla, MD;  Location: [facility] ENDOSCOPY;  Service: Cardiovascular;  Laterality: N/A; normal LV systolic funciton,  moderate LAE,  no LAA thrombus,  trace MR and TR  . TRANSTHORACIC ECHOCARDIOGRAM  06/03/2014   ef 55-60%/  trivial MR and TR    Current Outpatient Medications  Medication Sig Dispense Refill  . Cyanocobalamin (VITAMIN B-12) 2500 MCG SUBL Take 2,500 mcg by mouth daily.    Marland Kitchen diltiazem (CARDIZEM) 30 MG tablet Take 1 tablet every 4 hours AS NEEDED for AFIB heart rate >100 (Patient taking differently: Take 30 mg by mouth every 4 (four) hours as needed (AFIB heart rate >100). ) 45 tablet 1  . diltiazem (CARTIA XT) 120 MG 24 hr capsule Take 1 capsule (120 mg total) by mouth in the morning and at bedtime. (Patient taking differently: Take 120 mg by mouth every 12 (twelve) hours. ) 60 capsule 3  . flecainide (TAMBOCOR) 100 MG tablet TAKE 1 TABLET BY MOUTH TWICE DAILY (Patient taking differently: Take 100 mg by mouth in the morning and at bedtime. ) 60 tablet 11  . omeprazole (PRILOSEC) 20 MG capsule Take 20 mg by mouth daily before breakfast.     . rivaroxaban (XARELTO) 20 MG TABS tablet TAKE 1 TABLET BY MOUTH DAILY WITH SUPPER (Patient taking differently: Take 20 mg by mouth at bedtime. ) 30 tablet 5  .  tamsulosin (FLOMAX) 0.4 MG CAPS capsule Take 0.4 mg by mouth every 12 (twelve) hours.      No current facility-administered medications for this encounter.    Allergies  Allergen Reactions  . Dextromethorphan Other (See Comments)    Triggers afib/hypertension    Social History   Socioeconomic History  . Marital status: Divorced    Spouse name: Not on file  . Number of children: Not on file  . Years of education: Not on file  . Highest education level: Not on file  Occupational History  . Not on file  Tobacco Use  . Smoking status: Never Smoker  . Smokeless tobacco: Never Used  Vaping Use  . Vaping Use: Never used  Substance and Sexual Activity  . Alcohol use: No    Alcohol/week: 1.0 standard drink    Types: 1 Cans of beer per week    Comment: rare  . Drug use: No  . Sexual activity: Not on file  Other Topics Concern  . 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  Education: college.   Social Determinants of Health   Financial Resource Strain:   . Difficulty of Paying Living Expenses: Not on file  Food Insecurity:   . Worried About Charity fundraiser in the Last Year: Not on file  . Ran Out of Food in the Last Year: Not on file  Transportation Needs:   . Lack of Transportation (Medical): Not on file  . Lack of Transportation (Non-Medical): Not on file  Physical Activity:   . Days of Exercise per Week: Not on file  . Minutes of Exercise per Session: Not on file  Stress:   . Feeling of Stress : Not on file  Social Connections:   . Frequency of Communication with Friends and Family: Not on file  . Frequency of Social Gatherings with Friends and Family: Not on file  . Attends Religious Services: Not on file  . Active Member of Clubs or Organizations:  Not on file  . Attends Archivist Meetings: Not on file  . Marital Status: Not on file  Intimate  Partner Violence:   . Fear of Current or Ex-Partner: Not on file  . Emotionally Abused: Not on file  . Physically Abused: Not on file  . Sexually Abused: Not on file    Family History  Problem Relation Age of Onset  . CAD Father   . Heart attack Father   . Atrial fibrillation Neg Hx   . Anesthesia problems Neg Hx     ROS- All systems are reviewed and negative except as per the HPI above  Physical Exam: Vitals:   02/29/20 1140  BP: (!) 146/68  Pulse: (!) 56  Weight: 105.9 kg  Height: 5\' 11"  (1.803 m)   Wt Readings from Last 3 Encounters:  02/29/20 105.9 kg  02/28/20 103 kg  02/01/20 103.4 kg    Labs: Lab Results  Component Value Date   NA 142 01/26/2020   K 4.5 01/26/2020   CL 104 01/26/2020   CO2 27 01/26/2020   GLUCOSE 99 01/26/2020   BUN 15 01/26/2020   CREATININE 0.95 01/26/2020   CALCIUM 9.1 01/26/2020   MG 2.0 05/24/2014   Lab Results  Component Value Date   INR 1.26 10/20/2016   No results found for: CHOL, HDL, LDLCALC, TRIG   GEN- The patient is well appearing, alert and oriented x 3 today.   Head- normocephalic, atraumatic Eyes-  Sclera clear, conjunctiva pink Ears- hearing intact Oropharynx- clear Neck- supple, no JVP Lymph- no cervical lymphadenopathy Lungs- Clear to ausculation bilaterally, normal work of breathing Heart- slow regular rate and rhythm, no murmurs, rubs or gallops, PMI not laterally displaced GI- soft, NT, ND, + BS Extremities- no clubbing, cyanosis, or edema MS- no significant deformity or atrophy Skin- no rash or lesion Psych- euthymic mood, full affect Neuro- strength and sensation are intact  EKG- Sinus brady at  56 ms, pr int 172  ms, qrs int 96 ms, qtc 387 ms  Epic records reviewed   Assessment and Plan: 1. Paroxysmal afib Maintaining  SR until 11/25/19 and has had 2 cardioversions since then He is now s/p 3rd ablation  Maintaining  SR Continue flecainide at 100 mg bid   Continue diltiazem at 120 mg bid   Continue xarelto 20 mg daily for a chadsvasc score of at least  2, told not to interrupt for 2 more months after ablation Continue cpap   F/u with Dr. Rayann Heman  as scheduled 05/08/20   Geroge Baseman. Ivin Rosenbloom, [facility] [address] [place], [place] [address] [phone]

## 2020-03-04 DIAGNOSIS — G4733 Obstructive sleep apnea (adult) (pediatric): Secondary | ICD-10-CM | POA: Diagnosis not present

## 2020-03-07 ENCOUNTER — Encounter: Payer: Self-pay | Admitting: *Deleted

## 2020-04-03 DIAGNOSIS — G4733 Obstructive sleep apnea (adult) (pediatric): Secondary | ICD-10-CM | POA: Diagnosis not present

## 2020-04-04 ENCOUNTER — Other Ambulatory Visit (HOSPITAL_COMMUNITY): Payer: Self-pay | Admitting: Cardiology

## 2020-04-22 ENCOUNTER — Other Ambulatory Visit (HOSPITAL_COMMUNITY): Payer: Self-pay | Admitting: Nurse Practitioner

## 2020-05-02 ENCOUNTER — Other Ambulatory Visit (HOSPITAL_COMMUNITY): Payer: Self-pay

## 2020-05-02 MED ORDER — FLECAINIDE ACETATE 100 MG PO TABS
100.0000 mg | ORAL_TABLET | Freq: Two times a day (BID) | ORAL | 6 refills | Status: DC
Start: 2020-05-02 — End: 2020-05-08

## 2020-05-04 DIAGNOSIS — G4733 Obstructive sleep apnea (adult) (pediatric): Secondary | ICD-10-CM | POA: Diagnosis not present

## 2020-05-08 ENCOUNTER — Other Ambulatory Visit: Payer: Self-pay | Admitting: Internal Medicine

## 2020-05-08 ENCOUNTER — Ambulatory Visit: Payer: Medicare PPO | Admitting: Internal Medicine

## 2020-05-08 ENCOUNTER — Encounter: Payer: Self-pay | Admitting: Internal Medicine

## 2020-05-08 ENCOUNTER — Other Ambulatory Visit: Payer: Self-pay

## 2020-05-08 VITALS — BP 134/70 | HR 73 | Ht 71.0 in | Wt 233.8 lb

## 2020-05-08 DIAGNOSIS — I4819 Other persistent atrial fibrillation: Secondary | ICD-10-CM | POA: Diagnosis not present

## 2020-05-08 DIAGNOSIS — G4733 Obstructive sleep apnea (adult) (pediatric): Secondary | ICD-10-CM | POA: Diagnosis not present

## 2020-05-08 DIAGNOSIS — I10 Essential (primary) hypertension: Secondary | ICD-10-CM | POA: Diagnosis not present

## 2020-05-08 MED ORDER — FLECAINIDE ACETATE 50 MG PO TABS: 50.0000 mg | ORAL_TABLET | Freq: Two times a day (BID) | ORAL | 0 refills | Status: DC

## 2020-05-08 NOTE — Progress Notes (Signed)
PCP: Shirline Frees, MD    Carlos Thomas is a 69 y.o. male who presents today for routine electrophysiology followup.  Since his recent afib ablation, the patient reports doing very well.  he denies procedure related complications and is pleased with the results of the procedure.  Today, he denies symptoms of palpitations, chest pain, shortness of breath,  lower extremity edema, dizziness, presyncope, or syncope.  The patient is otherwise without complaint today.   Past Medical History:  Diagnosis Date  . Anticoagulant long-term use    xarelto  . Balanitis   . GERD (gastroesophageal reflux disease)   . History of adenomatous polyp of colon    2006  tubular adenoma's and hyperplastic polyp's;   2011  tubular adenoma  . History of atrial flutter    s/p ablation atrial flutter/atrial fib in 2010  . History of MRSA infection    1990's  infected wound near surgerical area   . History of repair of hiatal hernia   . HTN (hypertension)   . Obesity (BMI 30-39.9)   . OSA on CPAP    Mild OSA  per study 05-07-2012-- w AHI 6.57/hr now on 13cm H2O  . PAF (paroxysmal atrial fibrillation) [facility]) primary cardiologist-  dr Marlou Porch   ep cardiologist-  dr Rayann Heman---  s/p  ablation afib and aflutter 2010  and  ablation afib/svt  2013  . Peripheral neuropathy, hereditary/idiopathic    bilateral feet---  neurologist- dr patel  . 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 S/P radiofrequency ablation operation for arrhythmia    01-09-2009  cardiac ablation afib/flutter;   09-13-2011  ablation afib/svt and in ther right atrium  . Wears glasses    Past Surgical History:  Procedure Laterality Date  . ATRIAL FIBRILLATION ABLATION N/A 09/13/2011   Procedure: ATRIAL FIBRILLATION ABLATION;  Surgeon: Thompson Grayer, MD;  Location: [facility] CATH LAB;  Service: Cardiovascular;  Laterality: N/A;  . ATRIAL FIBRILLATION ABLATION N/A 02/01/2020   Procedure: ATRIAL FIBRILLATION ABLATION;  Surgeon:  Thompson Grayer, MD;  Location: [place] CV LAB;  Service: Cardiovascular;  Laterality: N/A;  . CARDIAC ELECTROPHYSIOLOGY MAPPING AND ABLATION  [phone]   dr Deaken Jurgens   successful ablation atrial fibrillation /  atrial flutter  . CARDIAC ELECTROPHYSIOLOGY MAPPING AND ABLATION  09-13-2011   dr Rayann Heman   afib mapping/ ablation and  mapping/ablation in the right atrium  . CARDIOVASCULAR STRESS TEST  09/2006   per dr Marlou Porch note nuclear study normal w/ no ischemia  . CARDIOVERSION N/A 09/21/2015   Procedure: CARDIOVERSION;  Surgeon: Lelon Perla, MD;  Location: [facility] ENDOSCOPY;  Service: Cardiovascular;  Laterality: N/A;  . CIRCUMCISION N/A 06/27/2016   Procedure: CIRCUMCISION ADULT;  Surgeon: Carolan Clines, MD;  Location[facility];  Service: Urology;  Laterality: N/A;  . COLONOSCOPY  last one 02/  2011  . DIRECT CURRENT CARDIOVERSION  02/02/2010   dr Leonia Reeves  . LAPAROSCOPIC CHOLECYSTECTOMY  1990's  . OPEN HIATAL HERNIA REPAIR  1970's  . TEE WITHOUT CARDIOVERSION  09/12/2011   Procedure: TRANSESOPHAGEAL ECHOCARDIOGRAM (TEE);  Surgeon: Jettie Booze, MD;  Location: [facility] ENDOSCOPY;  Service: Cardiovascular;  Laterality: N/A;  trace AR, no LA/LAA thrombus, normal LVfunction, moderate descending aortic atherosclerosis  . TEE WITHOUT CARDIOVERSION N/A 09/21/2015   Procedure: TRANSESOPHAGEAL ECHOCARDIOGRAM (TEE);  Surgeon: Lelon Perla, MD;  Location: [facility] ENDOSCOPY;  Service: Cardiovascular;  Laterality: N/A; normal LV systolic funciton,  moderate LAE,  no LAA thrombus,  trace MR and TR  . TRANSTHORACIC ECHOCARDIOGRAM  06/03/2014   ef 55-60%/  trivial MR and TR    ROS- all systems are personally reviewed and negatives except as per HPI above  Current Outpatient Medications  Medication Sig Dispense Refill  . Cyanocobalamin (VITAMIN B-12) 2500 MCG SUBL Take 2,500 mcg by mouth daily.    Marland Kitchen diltiazem (CARDIZEM CD) 120 MG 24 hr capsule TAKE 1 CAPSULE(120 MG) BY MOUTH IN THE  MORNING AND AT BEDTIME 60 capsule 6  . diltiazem (CARDIZEM) 30 MG tablet Take 1 tablet every 4 hours AS NEEDED for AFIB heart rate >100 45 tablet 1  . flecainide (TAMBOCOR) 100 MG tablet Take 1 tablet (100 mg total) by mouth 2 (two) times daily. 60 tablet 6  . omeprazole (PRILOSEC) 20 MG capsule Take 20 mg by mouth daily before breakfast.     . rivaroxaban (XARELTO) 20 MG TABS tablet TAKE 1 TABLET BY MOUTH DAILY WITH SUPPER 30 tablet 5  . tamsulosin (FLOMAX) 0.4 MG CAPS capsule Take 0.4 mg by mouth every 12 (twelve) hours.      No current facility-administered medications for this visit.    Physical Exam: Vitals:   05/08/20 1045  BP: 134/70  Pulse: 73  SpO2: 95%  Weight: 233 lb 12.8 oz (106.1 kg)  Height: 5\' 11"  (1.803 m)    GEN- The patient is well appearing, alert and oriented x 3 today.   Head- normocephalic, atraumatic Eyes-  Sclera clear, conjunctiva pink Ears- hearing intact Oropharynx- clear Lungs- Clear to ausculation bilaterally, normal work of breathing Heart- Regular rate and rhythm, no murmurs, rubs or gallops, PMI not laterally displaced GI- soft, NT, ND, + BS Extremities- no clubbing, cyanosis, or edema  EKG tracing ordered today is personally reviewed and shows sinus  Assessment and Plan:  1. Persistent atrial fibrillation Doing well s/p repeat ablation chads2vasc score is 2.  He is on xarelto Reduce flecainide to 50mg  BID x 4 weeks, then discontinue  2. HTN Stable No change required today  3. OSA Uses CPAP  4. Obesity Body mass index is 32.61 kg/m. Lifestyle modification is advised today   Return to see me in 3 months  Thompson Grayer MD, [facility] 05/08/2020 10:49 AM

## 2020-05-08 NOTE — Patient Instructions (Addendum)
Medication Instructions:  Reduce your flecainide to 50 mg two times and day for 4 weeks and then stop.   *If you need a refill on your cardiac medications before your next appointment, please call your pharmacy*  Lab Work: None ordered.  If you have labs (blood work) drawn today and your tests are completely normal, you will receive your results only by: Marland Kitchen MyChart Message (if you have MyChart) OR . A paper copy in the mail If you have any lab test that is abnormal or we need to change your treatment, we will call you to review the results.  Testing/Procedures: None ordered.  Follow-Up: At [facility], you and your health needs are our priority.  As part of our continuing mission to provide you with exceptional heart care, we have created designated Provider Care Teams.  These Care Teams include your primary Cardiologist (physician) and Advanced Practice Providers (APPs -  Physician Assistants and Nurse Practitioners) who all work together to provide you with the care you need, when you need it.  We recommend signing up for the patient portal called "MyChart".  Sign up information is provided on this After Visit Summary.  MyChart is used to connect with patients for Virtual Visits (Telemedicine).  Patients are able to view lab/test results, encounter notes, upcoming appointments, etc.  Non-urgent messages can be sent to your provider as well.   To learn more about what you can do with MyChart, go to [email or web address].    Your next appointment:   Your physician wants you to follow-up in: 08/10/20 at 10:45 am with Dr. Rayann Heman.    Other Instructions:

## 2020-05-10 ENCOUNTER — Other Ambulatory Visit: Payer: Self-pay | Admitting: Internal Medicine

## 2020-06-02 ENCOUNTER — Other Ambulatory Visit: Payer: Self-pay | Admitting: Internal Medicine

## 2020-06-02 NOTE — Telephone Encounter (Signed)
Outpatient Medication Detail   Disp Refills Start End   flecainide (TAMBOCOR) 50 MG tablet 180 tablet 3 05/10/2020    Sig: TAKE 1 TABLET(50 MG) BY MOUTH TWICE DAILY   Sent to pharmacy as: flecainide (TAMBOCOR) 50 MG tablet   Notes to Pharmacy: **Patient requests 90 days supply**   E-Prescribing Status: Receipt confirmed by pharmacy (05/10/2020  2:51 PM EST)   Pharmacy  [id number] - [place], [address] AT [place]

## 2020-06-03 DIAGNOSIS — G4733 Obstructive sleep apnea (adult) (pediatric): Secondary | ICD-10-CM | POA: Diagnosis not present

## 2020-06-08 ENCOUNTER — Other Ambulatory Visit (HOSPITAL_COMMUNITY): Payer: Self-pay | Admitting: Nurse Practitioner

## 2020-06-08 DIAGNOSIS — I480 Paroxysmal atrial fibrillation: Secondary | ICD-10-CM

## 2020-06-08 NOTE — Telephone Encounter (Signed)
Prescription refill request for Xarelto received.  Indication:  A fib Last office visit: 05/08/20 Weight: 106kg Age: 69 Scr: 0.95  CrCl: 147mL.min

## 2020-07-04 DIAGNOSIS — G4733 Obstructive sleep apnea (adult) (pediatric): Secondary | ICD-10-CM | POA: Diagnosis not present

## 2020-07-07 ENCOUNTER — Other Ambulatory Visit (HOSPITAL_COMMUNITY): Payer: Self-pay | Admitting: *Deleted

## 2020-07-07 DIAGNOSIS — I480 Paroxysmal atrial fibrillation: Secondary | ICD-10-CM

## 2020-07-07 MED ORDER — RIVAROXABAN 20 MG PO TABS: ORAL_TABLET | ORAL | 1 refills | Status: DC

## 2020-08-10 ENCOUNTER — Other Ambulatory Visit: Payer: Self-pay

## 2020-08-10 ENCOUNTER — Ambulatory Visit: Payer: Medicare PPO | Admitting: Internal Medicine

## 2020-08-10 ENCOUNTER — Encounter: Payer: Self-pay | Admitting: Internal Medicine

## 2020-08-10 VITALS — BP 150/70 | HR 57 | Ht 71.0 in | Wt 233.0 lb

## 2020-08-10 DIAGNOSIS — I4819 Other persistent atrial fibrillation: Secondary | ICD-10-CM

## 2020-08-10 DIAGNOSIS — D6869 Other thrombophilia: Secondary | ICD-10-CM

## 2020-08-10 DIAGNOSIS — I10 Essential (primary) hypertension: Secondary | ICD-10-CM

## 2020-08-10 DIAGNOSIS — G4733 Obstructive sleep apnea (adult) (pediatric): Secondary | ICD-10-CM

## 2020-08-10 NOTE — Progress Notes (Signed)
PCP: Shirline Frees, MD   Primary EP: Dr Loney Hering Carlos Thomas is a 70 y.o. male who presents today for routine electrophysiology followup.  Since last being seen in our clinic, the patient reports doing very well.  Today, he denies symptoms of palpitations, chest pain, shortness of breath,  lower extremity edema, dizziness, presyncope, or syncope.  The patient is otherwise without complaint today.   Past Medical History:  Diagnosis Date  . Anticoagulant long-term use    xarelto  . Balanitis   . GERD (gastroesophageal reflux disease)   . History of adenomatous polyp of colon    2006  tubular adenoma's and hyperplastic polyp's;   2011  tubular adenoma  . History of atrial flutter    s/p ablation atrial flutter/atrial fib in 2010  . History of MRSA infection    1990's  infected wound near surgerical area   . History of repair of hiatal hernia   . HTN (hypertension)   . Obesity (BMI 30-39.9)   . OSA on CPAP    Mild OSA  per study 05-07-2012-- w AHI 6.57/hr now on 13cm H2O  . PAF (paroxysmal atrial fibrillation) [facility]) primary cardiologist-  dr Marlou Porch   ep cardiologist-  dr Rayann Heman---  s/p  ablation afib and aflutter 2010  and  ablation afib/svt  2013  . Peripheral neuropathy, hereditary/idiopathic    bilateral feet---  neurologist- dr patel  . 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 S/P radiofrequency ablation operation for arrhythmia    01-09-2009  cardiac ablation afib/flutter;   09-13-2011  ablation afib/svt and in ther right atrium  . Wears glasses    Past Surgical History:  Procedure Laterality Date  . ATRIAL FIBRILLATION ABLATION N/A 09/13/2011   Procedure: ATRIAL FIBRILLATION ABLATION;  Surgeon: Thompson Grayer, MD;  Location: [facility] CATH LAB;  Service: Cardiovascular;  Laterality: N/A;  . ATRIAL FIBRILLATION ABLATION N/A 02/01/2020   Procedure: ATRIAL FIBRILLATION ABLATION;  Surgeon: Thompson Grayer, MD;  Location: [place] CV LAB;  Service:  Cardiovascular;  Laterality: N/A;  . CARDIAC ELECTROPHYSIOLOGY MAPPING AND ABLATION  [phone]   dr Rivka Baune   successful ablation atrial fibrillation /  atrial flutter  . CARDIAC ELECTROPHYSIOLOGY MAPPING AND ABLATION  09-13-2011   dr Rayann Heman   afib mapping/ ablation and  mapping/ablation in the right atrium  . CARDIOVASCULAR STRESS TEST  09/2006   per dr Marlou Porch note nuclear study normal w/ no ischemia  . CARDIOVERSION N/A 09/21/2015   Procedure: CARDIOVERSION;  Surgeon: Lelon Perla, MD;  Location: [facility] ENDOSCOPY;  Service: Cardiovascular;  Laterality: N/A;  . CIRCUMCISION N/A 06/27/2016   Procedure: CIRCUMCISION ADULT;  Surgeon: Carolan Clines, MD;  Location: [facility];  Service: Urology;  Laterality: N/A;  . COLONOSCOPY  last one 02/  2011  . DIRECT CURRENT CARDIOVERSION  02/02/2010   dr Leonia Reeves  . LAPAROSCOPIC CHOLECYSTECTOMY  1990's  . OPEN HIATAL HERNIA REPAIR  1970's  . TEE WITHOUT CARDIOVERSION  09/12/2011   Procedure: TRANSESOPHAGEAL ECHOCARDIOGRAM (TEE);  Surgeon: Jettie Booze, MD;  Location: [facility] ENDOSCOPY;  Service: Cardiovascular;  Laterality: N/A;  trace AR, no LA/LAA thrombus, normal LVfunction, moderate descending aortic atherosclerosis  . TEE WITHOUT CARDIOVERSION N/A 09/21/2015   Procedure: TRANSESOPHAGEAL ECHOCARDIOGRAM (TEE);  Surgeon: Lelon Perla, MD;  Location: [facility] ENDOSCOPY;  Service: Cardiovascular;  Laterality: N/A; normal LV systolic funciton,  moderate LAE,  no LAA thrombus,  trace MR and TR  . TRANSTHORACIC ECHOCARDIOGRAM  06/03/2014   ef 55-60%/  trivial MR and TR    ROS- all systems are reviewed and negatives except as per HPI above  Current Outpatient Medications  Medication Sig Dispense Refill  . Cyanocobalamin (VITAMIN B-12) 2500 MCG SUBL Take 2,500 mcg by mouth daily.    Marland Kitchen diltiazem (CARDIZEM CD) 120 MG 24 hr capsule TAKE 1 CAPSULE(120 MG) BY MOUTH IN THE MORNING AND AT BEDTIME 60 capsule 6  . diltiazem (CARDIZEM) 30 MG  tablet Take 1 tablet every 4 hours AS NEEDED for AFIB heart rate >100 45 tablet 1  . omeprazole (PRILOSEC) 20 MG capsule Take 20 mg by mouth daily before breakfast.     . rivaroxaban (XARELTO) 20 MG TABS tablet TAKE 1 TABLET BY MOUTH DAILY WITH SUPPER 90 tablet 1  . tamsulosin (FLOMAX) 0.4 MG CAPS capsule Take 0.4 mg by mouth every 12 (twelve) hours.      No current facility-administered medications for this visit.    Physical Exam: Vitals:   08/10/20 1054  BP: (!) 150/70  Pulse: (!) 57  SpO2: 96%  Weight: 233 lb (105.7 kg)  Height: 5\' 11"  (1.803 m)    GEN- The patient is well appearing, alert and oriented x 3 today.   Head- normocephalic, atraumatic Eyes-  Sclera clear, conjunctiva pink Ears- hearing intact Oropharynx- clear Lungs- Clear to ausculation bilaterally, normal work of breathing Heart- Regular rate and rhythm, no murmurs, rubs or gallops, PMI not laterally displaced GI- soft, NT, ND, + BS Extremities- no clubbing, cyanosis, or edema  Wt Readings from Last 3 Encounters:  08/10/20 233 lb (105.7 kg)  05/08/20 233 lb 12.8 oz (106.1 kg)  02/29/20 233 lb 6.4 oz (105.9 kg)    EKG tracing ordered today is personally reviewed and shows sinus bradycardia 54 bpm, PR 160 msec, QRS 90 msec, Qtc 392 msec  Assessment and Plan:  1. Persistent afib Doing well s/p repeat ablation off flecainide  2. HTN Stable Repeat by me is 136/58 No change required today  3. OSA Uses CPAP  4. Overweight Body mass index is 32.5 kg/m. Lifestyle modification is advised  Risks, benefits and potential toxicities for medications prescribed and/or refilled reviewed with patient today.   Return in 4 months  Thompson Grayer MD, [facility] 08/10/2020 11:29 AM

## 2020-08-10 NOTE — Patient Instructions (Addendum)
Medication Instructions:  Your physician recommends that you continue on your current medications as directed. Please refer to the Current Medication list given to you today.  Labwork: None ordered.  Testing/Procedures: None ordered.  Follow-Up: Your physician wants you to follow-up in: 12/11/20 at 9:30 am with Thompson Grayer, MD   Any Other Special Instructions Will Be Listed Below (If Applicable).  If you need a refill on your cardiac medications before your next appointment, please call your pharmacy.

## 2020-10-31 ENCOUNTER — Telehealth (HOSPITAL_COMMUNITY): Payer: Self-pay | Admitting: *Deleted

## 2020-10-31 ENCOUNTER — Encounter (HOSPITAL_BASED_OUTPATIENT_CLINIC_OR_DEPARTMENT_OTHER): Payer: Self-pay | Admitting: *Deleted

## 2020-10-31 ENCOUNTER — Emergency Department (HOSPITAL_BASED_OUTPATIENT_CLINIC_OR_DEPARTMENT_OTHER)
Admission: EM | Admit: 2020-10-31 | Discharge: 2020-10-31 | Disposition: A | Discharge status: Home / Self Care | Payer: Medicare PPO | Attending: Emergency Medicine | Admitting: Emergency Medicine

## 2020-10-31 ENCOUNTER — Other Ambulatory Visit: Payer: Self-pay

## 2020-10-31 DIAGNOSIS — Z79899 Other long term (current) drug therapy: Secondary | ICD-10-CM | POA: Diagnosis not present

## 2020-10-31 DIAGNOSIS — I4892 Unspecified atrial flutter: Secondary | ICD-10-CM | POA: Diagnosis not present

## 2020-10-31 DIAGNOSIS — Z7901 Long term (current) use of anticoagulants: Secondary | ICD-10-CM | POA: Insufficient documentation

## 2020-10-31 DIAGNOSIS — I10 Essential (primary) hypertension: Secondary | ICD-10-CM | POA: Diagnosis not present

## 2020-10-31 DIAGNOSIS — R002 Palpitations: Secondary | ICD-10-CM | POA: Diagnosis present

## 2020-10-31 LAB — CBC WITH DIFFERENTIAL/PLATELET
Abs Immature Granulocytes: 0.02 10*3/uL (ref 0.00–0.07)
Basophils Absolute: 0 10*3/uL (ref 0.0–0.1)
Basophils Relative: 1 %
Eosinophils Absolute: 0.1 10*3/uL (ref 0.0–0.5)
Eosinophils Relative: 1 %
HCT: 51.3 % (ref 39.0–52.0)
Hemoglobin: 17.5 g/dL — ABNORMAL HIGH (ref 13.0–17.0)
Immature Granulocytes: 0 %
Lymphocytes Relative: 18 %
Lymphs Abs: 1.1 10*3/uL (ref 0.7–4.0)
MCH: 32.7 pg (ref 26.0–34.0)
MCHC: 34.1 g/dL (ref 30.0–36.0)
MCV: 95.9 fL (ref 80.0–100.0)
Monocytes Absolute: 0.5 10*3/uL (ref 0.1–1.0)
Monocytes Relative: 8 %
Neutro Abs: 4.1 10*3/uL (ref 1.7–7.7)
Neutrophils Relative %: 72 %
Platelets: 152 10*3/uL (ref 150–400)
RBC: 5.35 MIL/uL (ref 4.22–5.81)
RDW: 13.1 % (ref 11.5–15.5)
WBC: 5.8 10*3/uL (ref 4.0–10.5)
nRBC: 0 % (ref 0.0–0.2)

## 2020-10-31 LAB — COMPREHENSIVE METABOLIC PANEL
ALT: 34 U/L (ref 0–44)
AST: 24 U/L (ref 15–41)
Albumin: 4.5 g/dL (ref 3.5–5.0)
Alkaline Phosphatase: 65 U/L (ref 38–126)
Anion gap: 10 (ref 5–15)
BUN: 17 mg/dL (ref 8–23)
CO2: 26 mmol/L (ref 22–32)
Calcium: 9.3 mg/dL (ref 8.9–10.3)
Chloride: 102 mmol/L (ref 98–111)
Creatinine, Ser: 1.01 mg/dL (ref 0.61–1.24)
GFR, Estimated: >60 mL/min (ref >60)
Glucose, Bld: 125 mg/dL — ABNORMAL HIGH (ref 70–99)
Potassium: 4.1 mmol/L (ref 3.5–5.1)
Sodium: 138 mmol/L (ref 135–145)
Total Bilirubin: 0.8 mg/dL (ref 0.3–1.2)
Total Protein: 8 g/dL (ref 6.5–8.1)

## 2020-10-31 LAB — MAGNESIUM: Magnesium: 1.9 mg/dL (ref 1.7–2.4)

## 2020-10-31 MED ORDER — ETOMIDATE 2 MG/ML IV SOLN
8.0000 mg | Freq: Once | INTRAVENOUS | Status: AC
  Administered 2020-10-31: 8 mg via INTRAVENOUS
  Filled 2020-10-31: qty 10

## 2020-10-31 MED ORDER — FLECAINIDE ACETATE 50 MG PO TABS: 50.0000 mg | ORAL_TABLET | Freq: Two times a day (BID) | ORAL | 0 refills | Status: DC

## 2020-10-31 NOTE — ED Notes (Signed)
Pt vagaled when inserting IV, HR improved to 115-130  Hypotensive and diaphoretic.

## 2020-10-31 NOTE — ED Notes (Signed)
ED Provider at bedside. 

## 2020-10-31 NOTE — ED Notes (Signed)
Patient set up on ETCO2 for sedation. Patient on monitor and AMBU bag at bedside.

## 2020-10-31 NOTE — Sedation Documentation (Signed)
Dr Billy Fischer remains at bedside, pt awake, oriented to person place, event.  HR 54.

## 2020-10-31 NOTE — ED Provider Notes (Signed)
[facility] EMERGENCY DEPARTMENT Provider Note   CSN: [id number] Arrival date & time: 10/31/20  1239     History Chief Complaint  Patient presents with  . Palpitations    Carlos Thomas is a 70 y.o. male.  HPI      70 year old male with a history of atrial flutter/fibrillation with replete ablation off flecainide for several months, on xarelto, hypertension, OSA, who presents with concern for palpitations.  Began last night HR was 150s, BP was ok, consitently in 150s Started 930PM, can usually tell when going into it Has not missed any doses of xarelto   Feels a little better now, but still feeling some palpitations  No shortness of breath or chest pain, no lightheadedness Last night had an emotional trigger Denies drugs, caffeine, etoh, recent illness, fever, n/v/d  Past Medical History:  Diagnosis Date  . Anticoagulant long-term use    xarelto  . Balanitis   . GERD (gastroesophageal reflux disease)   . History of adenomatous polyp of colon    2006  tubular adenoma's and hyperplastic polyp's;   2011  tubular adenoma  . History of atrial flutter    s/p ablation atrial flutter/atrial fib in 2010  . History of MRSA infection    1990's  infected wound near surgerical area   . History of repair of hiatal hernia   . HTN (hypertension)   . Obesity (BMI 30-39.9)   . OSA on CPAP    Mild OSA  per study 05-07-2012-- w AHI 6.57/hr now on 13cm H2O  . PAF (paroxysmal atrial fibrillation) [facility]) primary cardiologist-  dr Marlou Porch   ep cardiologist-  dr Rayann Heman---  s/p  ablation afib and aflutter 2010  and  ablation afib/svt  2013  . Peripheral neuropathy, hereditary/idiopathic    bilateral feet---  neurologist- dr patel  . 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 S/P radiofrequency ablation operation for arrhythmia    01-09-2009  cardiac ablation afib/flutter;   09-13-2011  ablation afib/svt and in ther right atrium  . Wears glasses      Patient Active Problem List   Diagnosis Date Noted  . Hereditary and idiopathic peripheral neuropathy 06/03/2016  . URI (upper respiratory infection) 05/27/2014  . Obesity (BMI 30-39.9)   . OSA (obstructive sleep apnea) 06/11/2013  . Essential hypertension 02/07/2009  . PAF (paroxysmal atrial fibrillation) ([place]) 02/07/2009  . Atrial flutter ([place]) 02/07/2009    Past Surgical History:  Procedure Laterality Date  . ATRIAL FIBRILLATION ABLATION N/A 09/13/2011   Procedure: ATRIAL FIBRILLATION ABLATION;  Surgeon: Thompson Grayer, MD;  Location: [facility] CATH LAB;  Service: Cardiovascular;  Laterality: N/A;  . ATRIAL FIBRILLATION ABLATION N/A 02/01/2020   Procedure: ATRIAL FIBRILLATION ABLATION;  Surgeon: Thompson Grayer, MD;  Location: [place] CV LAB;  Service: Cardiovascular;  Laterality: N/A;  . CARDIAC ELECTROPHYSIOLOGY MAPPING AND ABLATION  [phone]   dr allred   successful ablation atrial fibrillation /  atrial flutter  . CARDIAC ELECTROPHYSIOLOGY MAPPING AND ABLATION  09-13-2011   dr Rayann Heman   afib mapping/ ablation and  mapping/ablation in the right atrium  . CARDIOVASCULAR STRESS TEST  09/2006   per dr Marlou Porch note nuclear study normal w/ no ischemia  . CARDIOVERSION N/A 09/21/2015   Procedure: CARDIOVERSION;  Surgeon: Lelon Perla, MD;  Location: [facility] ENDOSCOPY;  Service: Cardiovascular;  Laterality: N/A;  . CIRCUMCISION N/A 06/27/2016   Procedure: CIRCUMCISION ADULT;  Surgeon: Carolan Clines, MD;  Location: [place]  CENTER;  Service: Urology;  Laterality: N/A;  . COLONOSCOPY  last one 02/  2011  . DIRECT CURRENT CARDIOVERSION  02/02/2010   dr Leonia Reeves  . LAPAROSCOPIC CHOLECYSTECTOMY  1990's  . OPEN HIATAL HERNIA REPAIR  1970's  . TEE WITHOUT CARDIOVERSION  09/12/2011   Procedure: TRANSESOPHAGEAL ECHOCARDIOGRAM (TEE);  Surgeon: Jettie Booze, MD;  Location: [facility] ENDOSCOPY;  Service: Cardiovascular;  Laterality: N/A;  trace AR, no LA/LAA thrombus, normal LVfunction,  moderate descending aortic atherosclerosis  . TEE WITHOUT CARDIOVERSION N/A 09/21/2015   Procedure: TRANSESOPHAGEAL ECHOCARDIOGRAM (TEE);  Surgeon: Lelon Perla, MD;  Location: [facility] ENDOSCOPY;  Service: Cardiovascular;  Laterality: N/A; normal LV systolic funciton,  moderate LAE,  no LAA thrombus,  trace MR and TR  . TRANSTHORACIC ECHOCARDIOGRAM  06/03/2014   ef 55-60%/  trivial MR and TR       Family History  Problem Relation Age of Onset  . CAD Father   . Heart attack Father   . Atrial fibrillation Neg Hx   . Anesthesia problems Neg Hx     Social History   Tobacco Use  . Smoking status: Never Smoker  . Smokeless tobacco: Never Used  Vaping Use  . Vaping Use: Never used  Substance Use Topics  . Alcohol use: No    Alcohol/week: 1.0 standard drink    Types: 1 Cans of beer per week    Comment: rare  . Drug use: No    Home Medications Prior to Admission medications   Medication Sig Start Date End Date Taking? Authorizing Provider  Cyanocobalamin (VITAMIN B-12) 2500 MCG SUBL Take 2,500 mcg by mouth daily.   Yes [provider]  diltiazem (CARDIZEM CD) 120 MG 24 hr capsule TAKE 1 CAPSULE(120 MG) BY MOUTH IN THE MORNING AND AT BEDTIME 04/24/20  Yes Sherran Needs, NP  omeprazole (PRILOSEC) 20 MG capsule Take 20 mg by mouth daily before breakfast.    Yes [provider]  rivaroxaban (XARELTO) 20 MG TABS tablet TAKE 1 TABLET BY MOUTH DAILY WITH SUPPER 07/07/20  Yes Sherran Needs, NP  tamsulosin (FLOMAX) 0.4 MG CAPS capsule Take 0.4 mg by mouth every 12 (twelve) hours.    Yes [provider]  flecainide (TAMBOCOR) 50 MG tablet Take 1 tablet (50 mg total) by mouth 2 (two) times daily. 10/31/20 10/31/20 Yes Gareth Morgan, MD  diltiazem (CARDIZEM) 30 MG tablet Take 1 tablet every 4 hours AS NEEDED for AFIB heart rate >100 12/01/19   Sherran Needs, NP    Allergies    Dextromethorphan  Review of Systems   Review of Systems  Constitutional: Negative  for fever.  HENT: Negative for sore throat.   Eyes: Negative for visual disturbance.  Respiratory: Negative for cough and shortness of breath.   Cardiovascular: Positive for palpitations. Negative for chest pain.  Gastrointestinal: Negative for abdominal pain, nausea and vomiting.  Genitourinary: Negative for difficulty urinating.  Musculoskeletal: Negative for back pain and neck stiffness.  Skin: Negative for rash.  Neurological: Negative for syncope and headaches.    Physical Exam Updated Vital Signs BP 124/74 (BP Location: Left Arm)   Pulse 64   Temp 97.9 F (36.6 C) (Oral)   Resp 16   Ht 5\' 11"  (1.803 m)   Wt 103.3 kg   SpO2 99%   BMI 31.77 kg/m   Physical Exam Vitals and nursing note reviewed.  Constitutional:      General: He is not in acute distress.    Appearance:  He is well-developed. He is not diaphoretic.  HENT:     Head: Normocephalic and atraumatic.  Eyes:     Conjunctiva/sclera: Conjunctivae normal.  Cardiovascular:     Rate and Rhythm: Regular rhythm. Tachycardia present.     Heart sounds: Normal heart sounds. No murmur heard. No friction rub. No gallop.   Pulmonary:     Effort: Pulmonary effort is normal. No respiratory distress.     Breath sounds: Normal breath sounds. No wheezing or rales.  Abdominal:     General: There is no distension.     Palpations: Abdomen is soft.     Tenderness: There is no abdominal tenderness. There is no guarding.  Musculoskeletal:     Cervical back: Normal range of motion.  Skin:    General: Skin is warm and dry.  Neurological:     Mental Status: He is alert and oriented to person, place, and time.     ED Results / Procedures / Treatments   Labs (all labs ordered are listed, but only abnormal results are displayed) Labs Reviewed  CBC WITH DIFFERENTIAL/PLATELET - Abnormal; Notable for the following components:      Result Value   Hemoglobin 17.5 (*)    All other components within normal limits  COMPREHENSIVE  METABOLIC PANEL - Abnormal; Notable for the following components:   Glucose, Bld 125 (*)    All other components within normal limits  MAGNESIUM    EKG EKG Interpretation  Date/Time:  Tuesday Oct 31 2020 15:36:08 EDT Ventricular Rate:  57 PR Interval:  165 QRS Duration: 102 QT Interval:  362 QTC Calculation: 353 R Axis:   109 Text Interpretation: Sinus rhythm Atrial premature complex Right axis deviation when compared to prior, now sinus from afib. no STEMI Confirmed by Antony Blackbird [phone]) on 10/31/2020 5:16:22 PM   Radiology No results found.  Procedures .Cardioversion  Date/Time: 11/01/2020 7:13 AM Performed by: Gareth Morgan, MD Authorized by: Gareth Morgan, MD   Consent:    Consent obtained:  Written   Consent given by:  Patient   Risks discussed:  Cutaneous burn, death, induced arrhythmia and pain   Alternatives discussed:  Rate-control medication Pre-procedure details:    Cardioversion basis:  Elective   Rhythm:  Atrial flutter   Electrode placement:  Anterior-posterior Patient sedated: Yes. Refer to sedation procedure documentation for details of sedation.  Attempt one:    Cardioversion mode:  Synchronous   Shock (Joules):  120   Shock outcome:  Conversion to normal sinus rhythm Post-procedure details:    Patient status:  Awake   Patient tolerance of procedure:  Tolerated well, no immediate complications .Sedation  Date/Time: 11/01/2020 7:14 AM Performed by: Gareth Morgan, MD Authorized by: Gareth Morgan, MD   Consent:    Consent obtained:  Verbal   Consent given by:  Patient   Risks discussed:  Allergic reaction, dysrhythmia, inadequate sedation, nausea, prolonged hypoxia resulting in organ damage, prolonged sedation necessitating reversal and respiratory compromise necessitating ventilatory assistance and intubation   Alternatives discussed:  Analgesia without sedation Universal protocol:    Immediately prior to procedure, a time out was  called: yes     Patient identity confirmed:  Verbally with patient Indications:    Procedure performed:  Cardioversion   Procedure necessitating sedation performed by:  Physician performing sedation Pre-sedation assessment:    Time since last food or drink:  4   ASA classification: class 1 - normal, healthy patient     Mouth opening:  3 or  more finger widths   Thyromental distance:  4 finger widths   Mallampati score:  III - soft palate, base of uvula visible   Neck mobility: normal     Pre-sedation assessments completed and reviewed: airway patency, cardiovascular function, hydration status, mental status, nausea/vomiting, pain level, respiratory function and temperature   Immediate pre-procedure details:    Reviewed: vital signs     Verified: bag valve mask available, emergency equipment available, intubation equipment available, IV patency confirmed and oxygen available   Procedure details (see MAR for exact dosages):    Preoxygenation:  Nasal cannula   Sedation:  Etomidate   Intended level of sedation: moderate (conscious sedation)   Total Provider sedation time (minutes):  2 Post-procedure details:    Procedure completion:  Tolerated with difficulty Comments:     Appeared sedated after 8mg  of etomidate, however was unfortunately still able to feel procedure at this dose     Medications Ordered in ED Medications  etomidate (AMIDATE) injection 8 mg (8 mg Intravenous Given 10/31/20 1533)    ED Course  I have reviewed the triage vital signs and the nursing notes.  Pertinent labs & imaging results that were available during my care of the patient were reviewed by me and considered in my medical decision making (see chart for details).    MDM Rules/Calculators/A&P                          70 year old male with a history of atrial flutter/fibrillation with replete ablation off flecainide for several months, on xarelto, hypertension, OSA, who presents with concern for  palpitations.  Pt in atrial tachycardia on arrival most consistent with atrial flutter with rate of 150, did vagal and have improvement of rate briefly to 120s with atrial flutter with variable block. Discussed options of further rate control versus cardioversion.  Discussed because he has been taking anticoagulation without missing doses and also reports knowing when he goes into arrhythmia with symptoms beginning last night that he may be candidate for cardioversion.  After discussing risks and benefits, this is his preference.  Cardioverted as above with return to sinus rhythm.  Will monitor post cardioversion to ensure no change in rhythm and discuss possibility of starting flecainide again with Cardiology.  Final Clinical Impression(s) / ED Diagnoses Final diagnoses:  Atrial flutter, unspecified type ([place])    Rx / DC Orders ED Discharge Orders         Ordered    flecainide (TAMBOCOR) 50 MG tablet  2 times daily,   Status:  Discontinued        10/31/20 1555           Gareth Morgan, MD 11/01/20 [phone]

## 2020-10-31 NOTE — ED Notes (Signed)
Pt consent signed by patient

## 2020-10-31 NOTE — Sedation Documentation (Signed)
Vital signs stable. 

## 2020-10-31 NOTE — Discharge Instructions (Addendum)
Please call to follow-up with A. fib clinic in the [place] several days.  Please rest and stay hydrated.  The cardiology team felt that  you should not start flecainide again for the single episode but they will discuss that during the appointment.  If any symptoms change or worsen acutely, please return to the nearest emergency department.

## 2020-10-31 NOTE — ED Triage Notes (Signed)
Palpitations since last night. Hx of atrial fib. No pain.

## 2020-10-31 NOTE — Sedation Documentation (Addendum)
Pt with eye fluttering, not answering questions, Cardioversion 120J by Dr Billy Fischer

## 2020-10-31 NOTE — ED Notes (Signed)
Significant other Manuela Schwartz at bedside awaiting discharge, will drive pt home once released.

## 2020-10-31 NOTE — ED Provider Notes (Signed)
Care sent from Dr. Billy Fischer after patient was cardioverted.  After cardioversion, patient was watched for several hours without any return to arrhythmia.  The cardiology team message saying that they would not start flecainide and recommend he follow-up in A. fib clinic in several days.  Patient agrees with plan and he tolerated eating and drinking.  We feel safe with him going home now.  Patient agrees and understands return precautions.  Patient discharged in good condition.   Clinical Impression: 1. Atrial flutter, unspecified type ([place])     Disposition: Discharge  Condition: Good  I have discussed the results, Dx and Tx plan with the pt(& family if present). He/she/they expressed understanding and agree(s) with the plan. Discharge instructions discussed at great length. Strict return precautions discussed and pt &/or family have verbalized understanding of the instructions. No further questions at time of discharge.    Current Discharge Medication List      Follow Up: Thompson Grayer, MD [place] [address] [phone]  Schedule an appointment as soon as possible for a visit       Amaree Loisel, Gwenyth Allegra, MD 10/31/20 1735

## 2020-10-31 NOTE — Sedation Documentation (Signed)
Pt awake, alert, oriented to person, place event, moving all extremities. Conversing with family member at bedside.

## 2020-10-31 NOTE — Telephone Encounter (Signed)
Patient called in stating he went into a rapid heart rate around 930pm. HR 150-157.  Pt has taken multiple doses of PRN diltiazem 30mg  tabs as well as 2 doses of 100mg  of flecainide (one dose last night and repeated at 1030am this morning) without relief.  Currently BP 105/78 HR 150. No missed doses of Xarelto.  Pt is very fatigued Denies CP.  Discussed with Roderic Palau NP recommends proceeding to ER for cardioversion with hypotension/no response to rate control or flecainide.  Pt in agreement will proceed to [place] for cardioversion.

## 2020-11-07 ENCOUNTER — Ambulatory Visit (HOSPITAL_COMMUNITY)
Admission: RE | Admit: 2020-11-07 | Discharge: 2020-11-07 | Disposition: A | Discharge status: Home / Self Care | Payer: Medicare PPO | Source: Ambulatory Visit | Attending: Nurse Practitioner | Admitting: Nurse Practitioner

## 2020-11-07 ENCOUNTER — Encounter (HOSPITAL_COMMUNITY): Payer: Self-pay | Admitting: Nurse Practitioner

## 2020-11-07 ENCOUNTER — Other Ambulatory Visit: Payer: Self-pay

## 2020-11-07 VITALS — BP 142/64 | HR 53 | Ht 71.0 in | Wt 232.6 lb

## 2020-11-07 DIAGNOSIS — Z7901 Long term (current) use of anticoagulants: Secondary | ICD-10-CM | POA: Insufficient documentation

## 2020-11-07 DIAGNOSIS — I480 Paroxysmal atrial fibrillation: Secondary | ICD-10-CM | POA: Diagnosis not present

## 2020-11-07 DIAGNOSIS — G4733 Obstructive sleep apnea (adult) (pediatric): Secondary | ICD-10-CM | POA: Diagnosis not present

## 2020-11-07 DIAGNOSIS — Z8249 Family history of ischemic heart disease and other diseases of the circulatory system: Secondary | ICD-10-CM | POA: Diagnosis not present

## 2020-11-07 DIAGNOSIS — Z79899 Other long term (current) drug therapy: Secondary | ICD-10-CM | POA: Insufficient documentation

## 2020-11-07 DIAGNOSIS — I4819 Other persistent atrial fibrillation: Secondary | ICD-10-CM

## 2020-11-07 DIAGNOSIS — I4892 Unspecified atrial flutter: Secondary | ICD-10-CM | POA: Insufficient documentation

## 2020-11-07 DIAGNOSIS — D6869 Other thrombophilia: Secondary | ICD-10-CM

## 2020-11-07 NOTE — Progress Notes (Signed)
Primary Care Physician: Shirline Frees, MD Referring Physician: [facility] ER f/u EP: Dr. Loney Hering Carlos Thomas is a 70 y.o. male with a h/o afib s/p w ablations, 2010 and 2013 that intermittently requires cardioversion, on flecainide. Pt was involved in a traffic situation that escalated his HR on Friday and immediately felt his heart go into afib. He thought he took an extra flecainide but by mistake, he took a nausea pill. He went to the  ER on Saturday but the ER doc did not want to cardiovert as he was rate controlled. He returned on Sunday to the ER with afib around 120 bpm and received successful  cardioversion.  On last visit in April 2019, he continued to maintain S brady at 50 bpm. Continues on xarelto 20 mg daily. He was given an extra 120 mg diltiazem to add to his 240 mg daily Cardizem in the ER.  F/u afib clinic, 12/6. He is in SR and has not had any afib but has neuropathy and his neurologist said that flecainide may be worsening his symptoms. He says that he has had neuropathy  for awhile and the symptoms are stable. He does not want to stop flecainide but wanted to know our experience with this.   F/u in afib clinic, 09/28/19. I have not seen pt since 2019. He decided to stay on the flecainide after his last visit as  we discussed staying on flecainide despite it may be worsening his peripheral neuropathy as mentioned by his neurologist. He states that it has stayed about the same and he prefers to continue its use. He has not noted any afib. EKG shows sinus brady at 46 bpm, LAFB.   F/u in afib clinic, 5/27. He had a very busy day yesterday, doing multiple activities outside around a lot of pollen. He coughed  hard last pm and wento out of rhythm. He took his Cardizem and his flecainide and when he was still out of rhythm a couple hours later, he went to [facility]. After unsuccessfully  trying to convert with IV Cardizem, he was successfully cardioverted. He did have some  issues  with hypotension and bradycardia afterward. He did not leave there until 7:30 this am. Now his BP is normal and he remains  in sinus brady at 53 bpm. He is very tired for being up all night.   F/u in afib clinic, 12/01/19. He had another cardioversion 5/29 and is quite upset that he has had 2 episodes in the last few days. We discussed that he did not go to have an urgent cardioversion if he was stable. He does come out of cardioversion bradycardic and with low BP with an extra 120 mg Cardizem po  or at the time of his first DCCV, Cardizem was used first IV. We did discuss that he could use 30 mg Cardizem that may convert without going to the  ER and not drop his BP/HR as much. He has himself gone back up to 120 mg bid of Cardizem but have asked him not to increase further as he had HR's in the 40's with higher doses of CCB. Now HR mid 50's. He had an atrial flutter ablation in 2010 and an afib ablation in 2013 and may be time to see Dr. Rayann Heman  for an repeat afib ablation as he has had a very long time in [place]. He will need updated echo. The other option would be to change antiarrythmic to Tikosyn.  F/u in afib ablation 02/29/20.He is now one month s/p ablation. He only has noted one episode afib and that was drinking regular coffee given to him in a restaurant when he had asked for decaf. He took an extra cardizem 30 mg and it calmed down quickly. He continues on flecainide/cardizem as well as xarelto 20 mg daily for a CHA2DS2VASc dcore of at least 2.   F/u in afib clinic, 5/10, after cardioversion in the ER for atrial flutter at 150 bpm. He presented there after flecainde x 2 and prn cardizem did not touch the 150 bpm rate.  It was successful and he remains in SR. Feels that family stress trigger this episode. No further arrhythmia since CV. He has noted a few elevated  BP's in the pm around 782 systolic. Encouraged to keep readings to review with Dr. Rayann Heman in June. 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 Rayann Heman.   Today, he denies symptoms of palpitations, chest pain, shortness of breath, orthopnea, PND, lower extremity edema, dizziness, presyncope, syncope, or neurologic sequela. The patient is tolerating medications without difficulties and is otherwise without complaint today.   Past Medical History:  Diagnosis Date  . Anticoagulant long-term use    xarelto  . Balanitis   . GERD (gastroesophageal reflux disease)   . History of adenomatous polyp of colon    2006  tubular adenoma's and hyperplastic polyp's;   2011  tubular adenoma  . History of atrial flutter    s/p ablation atrial flutter/atrial fib in 2010  . History of MRSA infection    1990's  infected wound near surgerical area   . History of repair of hiatal hernia   . HTN (hypertension)   . Obesity (BMI 30-39.9)   . OSA on CPAP    Mild OSA  per study 05-07-2012-- w AHI 6.57/hr now on 13cm H2O  . PAF (paroxysmal atrial fibrillation) [facility]) primary cardiologist-  dr Marlou Porch   ep cardiologist-  dr Rayann Heman---  s/p  ablation afib and aflutter 2010  and  ablation afib/svt  2013  . Peripheral neuropathy, hereditary/idiopathic    bilateral feet---  neurologist- dr patel  . 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 S/P radiofrequency ablation operation for arrhythmia    01-09-2009  cardiac ablation afib/flutter;   09-13-2011  ablation afib/svt and in ther right atrium  . Wears glasses    Past Surgical History:  Procedure Laterality Date  . ATRIAL FIBRILLATION ABLATION N/A 09/13/2011   Procedure: ATRIAL FIBRILLATION ABLATION;  Surgeon: Thompson Grayer, MD;  Location: [facility] CATH LAB;  Service: Cardiovascular;  Laterality: N/A;  . ATRIAL FIBRILLATION ABLATION N/A 02/01/2020   Procedure: ATRIAL FIBRILLATION ABLATION;  Surgeon: Thompson Grayer, MD;  Location: [place] CV LAB;  Service: Cardiovascular;  Laterality: N/A;  . CARDIAC ELECTROPHYSIOLOGY  MAPPING AND ABLATION  [phone]   dr allred   successful ablation atrial fibrillation /  atrial flutter  . CARDIAC ELECTROPHYSIOLOGY MAPPING AND ABLATION  09-13-2011   dr Rayann Heman   afib mapping/ ablation and  mapping/ablation in the right atrium  . CARDIOVASCULAR STRESS TEST  09/2006   per dr Marlou Porch note nuclear study normal w/ no ischemia  . CARDIOVERSION N/A 09/21/2015   Procedure: CARDIOVERSION;  Surgeon: Lelon Perla, MD;  Location: [facility] ENDOSCOPY;  Service: Cardiovascular;  Laterality: N/A;  . CIRCUMCISION N/A 06/27/2016   Procedure: CIRCUMCISION ADULT;  Surgeon: Carolan Clines, MD;  Location: [facility];  Service: Urology;  Laterality: N/A;  . COLONOSCOPY  last one 02/  2011  . DIRECT CURRENT CARDIOVERSION  02/02/2010   dr Leonia Reeves  . LAPAROSCOPIC CHOLECYSTECTOMY  1990's  . OPEN HIATAL HERNIA REPAIR  1970's  . TEE WITHOUT CARDIOVERSION  09/12/2011   Procedure: TRANSESOPHAGEAL ECHOCARDIOGRAM (TEE);  Surgeon: Jettie Booze, MD;  Location: [facility] ENDOSCOPY;  Service: Cardiovascular;  Laterality: N/A;  trace AR, no LA/LAA thrombus, normal LVfunction, moderate descending aortic atherosclerosis  . TEE WITHOUT CARDIOVERSION N/A 09/21/2015   Procedure: TRANSESOPHAGEAL ECHOCARDIOGRAM (TEE);  Surgeon: Lelon Perla, MD;  Location: [facility] ENDOSCOPY;  Service: Cardiovascular;  Laterality: N/A; normal LV systolic funciton,  moderate LAE,  no LAA thrombus,  trace MR and TR  . TRANSTHORACIC ECHOCARDIOGRAM  06/03/2014   ef 55-60%/  trivial MR and TR    Current Outpatient Medications  Medication Sig Dispense Refill  . Cyanocobalamin (VITAMIN B-12) 2500 MCG SUBL Take 2,500 mcg by mouth daily.    Marland Kitchen diltiazem (CARDIZEM CD) 120 MG 24 hr capsule TAKE 1 CAPSULE(120 MG) BY MOUTH IN THE MORNING AND AT BEDTIME 60 capsule 6  . diltiazem (CARDIZEM) 30 MG tablet Take 1 tablet every 4 hours AS NEEDED for AFIB heart rate >100 45 tablet 1  . omeprazole (PRILOSEC) 20 MG capsule Take 20 mg by  mouth daily before breakfast.     . rivaroxaban (XARELTO) 20 MG TABS tablet TAKE 1 TABLET BY MOUTH DAILY WITH SUPPER 90 tablet 1  . tamsulosin (FLOMAX) 0.4 MG CAPS capsule Take 0.4 mg by mouth every 12 (twelve) hours.      No current facility-administered medications for this encounter.    Allergies  Allergen Reactions  . Dextromethorphan Other (See Comments)    Triggers afib/hypertension    Social History   Socioeconomic History  . Marital status: Divorced    Spouse name: Not on file  . Number of children: Not on file  . Years of education: Not on file  . Highest education level: Not on file  Occupational History  . Not on file  Tobacco Use  . Smoking status: Never Smoker  . Smokeless tobacco: Never Used  Vaping Use  . Vaping Use: Never used  Substance and Sexual Activity  . Alcohol use: No    Alcohol/week: 1.0 standard drink    Types: 1 Cans of beer per week    Comment: rare  . Drug use: No  . Sexual activity: Not on file  Other Topics Concern  . 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  Education: college.   Social Determinants of Health   Financial Resource Strain: Not on file  Food Insecurity: Not on file  Transportation Needs: Not on file  Physical Activity: Not on file  Stress: Not on file  Social Connections: Not on file  Intimate Partner Violence: Not on file    Family History  Problem Relation Age of Onset  . CAD Father   . Heart attack Father   . Atrial fibrillation Neg Hx   . Anesthesia problems Neg Hx     ROS- All systems are reviewed and negative except as per the HPI above  Physical Exam: There were no vitals filed for this visit. Wt Readings from Last 3 Encounters:  10/31/20 103.3 kg  08/10/20 105.7 kg  05/08/20  106.1 kg    Labs: Lab Results  Component Value Date   NA 138 10/31/2020   K 4.1 10/31/2020   CL 102 10/31/2020    CO2 26 10/31/2020   GLUCOSE 125 (H) 10/31/2020   BUN 17 10/31/2020   CREATININE 1.01 10/31/2020   CALCIUM 9.3 10/31/2020   MG 1.9 10/31/2020   Lab Results  Component Value Date   INR 1.26 10/20/2016   No results found for: CHOL, HDL, LDLCALC, TRIG   GEN- The patient is well appearing, alert and oriented x 3 today.   Head- normocephalic, atraumatic Eyes-  Sclera clear, conjunctiva pink Ears- hearing intact Oropharynx- clear Neck- supple, no JVP Lymph- no cervical lymphadenopathy Lungs- Clear to ausculation bilaterally, normal work of breathing Heart- slow regular rate and rhythm, no murmurs, rubs or gallops, PMI not laterally displaced GI- soft, NT, ND, + BS Extremities- no clubbing, cyanosis, or edema MS- no significant deformity or atrophy Skin- no rash or lesion Psych- euthymic mood, full affect Neuro- strength and sensation are intact  EKG- Sinus brady at  53 ms, pr int 158  ms, qrs int 94 ms, qtc 394 ms  Epic records reviewed   Assessment and Plan: 1. Paroxysmal afib/flutter  Successful ER CV, 10/31/20,  for aflutter at 150 bpm, refractory to meds (prn flecainde and prn cardizem)  He is  s/p 3rd ablation 02/01/20 In SR since CV, trigger may have been family stress Continue diltiazem at 120 mg bid    2.  Chadsvasc score of at least  2  Continue xarelto 20 mg daily   3. OSA Continue CPAP  4. Borderline BP readings Record BP's for the next several weeks for Dr. Rayann Heman to review 5/13  5. Renewal of pilot's license  Can see what specific's are needed to renew and discuss with Dr. Rayann Heman   F/u with Dr. Rayann Heman  as scheduled  12/11/20  Geroge Baseman. Rondarius Kadrmas, [facility] [address] [place], [place] [address] [phone]

## 2020-11-21 ENCOUNTER — Other Ambulatory Visit (HOSPITAL_COMMUNITY): Payer: Self-pay | Admitting: *Deleted

## 2020-11-21 MED ORDER — DILTIAZEM HCL ER COATED BEADS 120 MG PO CP24: ORAL_CAPSULE | ORAL | 6 refills | Status: DC

## 2020-12-11 ENCOUNTER — Encounter: Payer: Self-pay | Admitting: Internal Medicine

## 2020-12-11 ENCOUNTER — Encounter: Payer: Self-pay | Admitting: *Deleted

## 2020-12-11 ENCOUNTER — Other Ambulatory Visit: Payer: Self-pay

## 2020-12-11 ENCOUNTER — Ambulatory Visit: Payer: Medicare PPO | Admitting: Internal Medicine

## 2020-12-11 VITALS — BP 126/60 | HR 54 | Ht 71.0 in | Wt 230.8 lb

## 2020-12-11 DIAGNOSIS — I10 Essential (primary) hypertension: Secondary | ICD-10-CM | POA: Diagnosis not present

## 2020-12-11 DIAGNOSIS — G4733 Obstructive sleep apnea (adult) (pediatric): Secondary | ICD-10-CM | POA: Diagnosis not present

## 2020-12-11 DIAGNOSIS — I4819 Other persistent atrial fibrillation: Secondary | ICD-10-CM

## 2020-12-11 NOTE — Patient Instructions (Addendum)
Medication Instructions:  Your physician recommends that you continue on your current medications as directed. Please refer to the Current Medication list given to you today.  Labwork: None ordered.  Testing/Procedures: None ordered.  Follow-Up: Your physician wants you to follow-up in: 03/21/21 at 2:20 pm with Dr. Radford Pax. 06/18/21 10:30 am with Thompson Grayer, MD   Any Other Special Instructions Will Be Listed Below (If Applicable).  If you need a refill on your cardiac medications before your next appointment, please call your pharmacy.

## 2020-12-11 NOTE — Progress Notes (Signed)
PCP: Shirline Frees, MD Primary Cardiologist: Dr Marlou Porch Primary EP: Dr Loney Hering Carlos Thomas is a 70 y.o. male who presents today for routine electrophysiology followup.  Since last being seen in our clinic, the patient reports doing very well.  Today, he denies symptoms of palpitations, chest pain, shortness of breath,  lower extremity edema, dizziness, presyncope, or syncope.  The patient is otherwise without complaint today.   Past Medical History:  Diagnosis Date   Anticoagulant long-term use    xarelto   Balanitis    GERD (gastroesophageal reflux disease)    History of adenomatous polyp of colon    2006  tubular adenoma's and hyperplastic polyp's;   2011  tubular adenoma   History of atrial flutter    s/p ablation atrial flutter/atrial fib in 2010   History of MRSA infection    1990's  infected wound near surgerical area    History of repair of hiatal hernia    HTN (hypertension)    Obesity (BMI 30-39.9)    OSA on CPAP    Mild OSA  per study 05-07-2012-- w AHI 6.57/hr now on 13cm H2O   PAF (paroxysmal atrial fibrillation) [facility]) primary cardiologist-  dr Marlou Porch   ep cardiologist-  dr Rayann Heman---  s/p  ablation afib and aflutter 2010  and  ablation afib/svt  2013   Peripheral neuropathy, hereditary/idiopathic    bilateral feet---  neurologist- dr patel   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[place], 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 , fireman   S/P radiofrequency ablation operation for arrhythmia    01-09-2009  cardiac ablation afib/flutter;   09-13-2011  ablation afib/svt and in ther right atrium   Wears glasses    Past Surgical History:  Procedure Laterality Date   ATRIAL FIBRILLATION ABLATION N/A 09/13/2011   Procedure: ATRIAL FIBRILLATION ABLATION;  Surgeon: Thompson Grayer, MD;  Location: [facility] CATH LAB;  Service: Cardiovascular;  Laterality: N/A;   ATRIAL FIBRILLATION ABLATION N/A 02/01/2020   Procedure: ATRIAL FIBRILLATION ABLATION;  Surgeon: Thompson Grayer, MD;  Location: [place] CV LAB;   Service: Cardiovascular;  Laterality: N/A;   CARDIAC ELECTROPHYSIOLOGY MAPPING AND ABLATION  [phone]   dr Laylonie Marzec   successful ablation atrial fibrillation /  atrial flutter   CARDIAC ELECTROPHYSIOLOGY MAPPING AND ABLATION  09-13-2011   dr Rayann Heman   afib mapping/ ablation and  mapping/ablation in the right atrium   CARDIOVASCULAR STRESS TEST  09/2006   per dr Marlou Porch note nuclear study normal w/ no ischemia   CARDIOVERSION N/A 09/21/2015   Procedure: CARDIOVERSION;  Surgeon: Lelon Perla, MD;  Location: [facility] ENDOSCOPY;  Service: Cardiovascular;  Laterality: N/A;   CIRCUMCISION N/A 06/27/2016   Procedure: CIRCUMCISION ADULT;  Surgeon: Carolan Clines, MD;  Location: [facility];  Service: Urology;  Laterality: N/A;   COLONOSCOPY  last one 02/  2011   DIRECT CURRENT CARDIOVERSION  02/02/2010   dr Leonia Reeves   LAPAROSCOPIC CHOLECYSTECTOMY  1990's   OPEN HIATAL HERNIA REPAIR  1970's   TEE WITHOUT CARDIOVERSION  09/12/2011   Procedure: TRANSESOPHAGEAL ECHOCARDIOGRAM (TEE);  Surgeon: Jettie Booze, MD;  Location: [facility] ENDOSCOPY;  Service: Cardiovascular;  Laterality: N/A;  trace AR, no LA/LAA thrombus, normal LVfunction, moderate descending aortic atherosclerosis   TEE WITHOUT CARDIOVERSION N/A 09/21/2015   Procedure: TRANSESOPHAGEAL ECHOCARDIOGRAM (TEE);  Surgeon: Lelon Perla, MD;  Location: [facility] ENDOSCOPY;  Service: Cardiovascular;  Laterality: N/A; normal LV systolic funciton,  moderate LAE,  no LAA thrombus,  trace MR and TR  TRANSTHORACIC ECHOCARDIOGRAM  06/03/2014   ef 55-60%/  trivial MR and TR    ROS- all systems are reviewed and negatives except as per HPI above  Current Outpatient Medications  Medication Sig Dispense Refill   Cyanocobalamin (VITAMIN B-12) 2500 MCG SUBL Take 2,500 mcg by mouth daily.     diltiazem (CARDIZEM CD) 120 MG 24 hr capsule TAKE 1 CAPSULE(120 MG) BY MOUTH IN THE MORNING AND AT BEDTIME 60 capsule 6   diltiazem (CARDIZEM) 30 MG tablet  Take 1 tablet every 4 hours AS NEEDED for AFIB heart rate >100 45 tablet 1   omeprazole (PRILOSEC) 20 MG capsule Take 20 mg by mouth daily before breakfast.      rivaroxaban (XARELTO) 20 MG TABS tablet TAKE 1 TABLET BY MOUTH DAILY WITH SUPPER 90 tablet 1   tamsulosin (FLOMAX) 0.4 MG CAPS capsule Take 0.4 mg by mouth every 12 (twelve) hours.      No current facility-administered medications for this visit.    Physical Exam: Vitals:   12/11/20 0939  BP: 126/60  Pulse: (!) 54  SpO2: 95%  Weight: 230 lb 12.8 oz (104.7 kg)  Height: 5\' 11"  (1.803 m)    GEN- The patient is well appearing, alert and oriented x 3 today.   Head- normocephalic, atraumatic Eyes-  Sclera clear, conjunctiva pink Ears- hearing intact Oropharynx- clear Lungs- Clear to ausculation bilaterally, normal work of breathing Heart- Regular rate and rhythm, no murmurs, rubs or gallops, PMI not laterally displaced GI- soft, NT, ND, + BS Extremities- no clubbing, cyanosis, or edema  Wt Readings from Last 3 Encounters:  12/11/20 230 lb 12.8 oz (104.7 kg)  11/07/20 232 lb 9.6 oz (105.5 kg)  10/31/20 227 lb 12.8 oz (103.3 kg)    EKG tracing ordered today is personally reviewed and shows sinus  Assessment and Plan:  Persistent atrial fibrillation Doing well post ablation off flecainide He did require cardioversion of atypical atrial flutter 10/31/20.  No arrhythmias since Continue xarelto for chads2vasc score of 2  2. HTN Stable No change required today  3. OSA Uses CPAP Follows with Dr Radford Pax  Risks, benefits and potential toxicities for medications prescribed and/or refilled reviewed with patient today.   Return in 6 months  Thompson Grayer MD, [facility] 12/11/2020 9:52 AM

## 2020-12-30 ENCOUNTER — Other Ambulatory Visit (HOSPITAL_COMMUNITY): Payer: Self-pay | Admitting: Nurse Practitioner

## 2020-12-30 DIAGNOSIS — I480 Paroxysmal atrial fibrillation: Secondary | ICD-10-CM

## 2021-01-02 DIAGNOSIS — N411 Chronic prostatitis: Secondary | ICD-10-CM | POA: Diagnosis not present

## 2021-01-02 NOTE — Telephone Encounter (Signed)
Xarelto 20mg  refill request received. Pt is 70 years old, weight-104.7kg, Crea-1.01 on 10/31/2020, last seen by Dr. Rayann Heman on 12/11/2020, Diagnosis-Afib, CrCl-99.28ml/min; Dose is appropriate based on dosing criteria. Will send in refill to requested pharmacy.

## 2021-01-08 DIAGNOSIS — R3915 Urgency of urination: Secondary | ICD-10-CM | POA: Diagnosis not present

## 2021-01-08 DIAGNOSIS — N401 Enlarged prostate with lower urinary tract symptoms: Secondary | ICD-10-CM | POA: Diagnosis not present

## 2021-02-28 DIAGNOSIS — H524 Presbyopia: Secondary | ICD-10-CM | POA: Diagnosis not present

## 2021-02-28 DIAGNOSIS — H2513 Age-related nuclear cataract, bilateral: Secondary | ICD-10-CM | POA: Diagnosis not present

## 2021-02-28 DIAGNOSIS — H35033 Hypertensive retinopathy, bilateral: Secondary | ICD-10-CM | POA: Diagnosis not present

## 2021-03-14 DIAGNOSIS — U071 COVID-19: Secondary | ICD-10-CM | POA: Diagnosis not present

## 2021-03-21 ENCOUNTER — Ambulatory Visit: Payer: Medicare PPO | Admitting: Cardiology

## 2021-03-21 DIAGNOSIS — G4733 Obstructive sleep apnea (adult) (pediatric): Secondary | ICD-10-CM | POA: Diagnosis not present

## 2021-03-22 ENCOUNTER — Other Ambulatory Visit (HOSPITAL_COMMUNITY): Payer: Self-pay | Admitting: *Deleted

## 2021-03-22 MED ORDER — DILTIAZEM HCL 30 MG PO TABS: ORAL_TABLET | ORAL | 1 refills | Status: DC

## 2021-04-10 ENCOUNTER — Encounter: Payer: Self-pay | Admitting: Cardiology

## 2021-04-10 ENCOUNTER — Other Ambulatory Visit: Payer: Self-pay

## 2021-04-10 ENCOUNTER — Ambulatory Visit: Payer: Medicare PPO | Admitting: Cardiology

## 2021-04-10 VITALS — BP 136/68 | HR 57 | Ht 71.0 in | Wt 235.6 lb

## 2021-04-10 DIAGNOSIS — G4733 Obstructive sleep apnea (adult) (pediatric): Secondary | ICD-10-CM | POA: Diagnosis not present

## 2021-04-10 DIAGNOSIS — E669 Obesity, unspecified: Secondary | ICD-10-CM

## 2021-04-10 DIAGNOSIS — I10 Essential (primary) hypertension: Secondary | ICD-10-CM | POA: Diagnosis not present

## 2021-04-10 NOTE — Patient Instructions (Addendum)

## 2021-04-10 NOTE — Progress Notes (Signed)
Date:  04/10/2021   ID:  Carlos Thomas, DOB 15-Mar-1951, MRN [id number]  PCP:  Shirline Frees, MD  Cardiologist:  Candee Furbish, MD  Sleep Medicine:  Fransico Him, MD Electrophysiologist:  None   Chief Complaint:  OSA  History of Present Illness:    Carlos Thomas is a 70 y.o. male  with a hx of OSA and obesity. He is doing well with his CPAP device and thinks that he has gotten used to it.  He tolerates the mask and feels the pressure is adequate.  Since going on CPAP he feels rested in the am and has no significant daytime sleepiness. Occasionally he will take a nap after lunch.  He denies any significant mouth or nasal dryness or nasal congestion.  He does not think that he snores.     Prior CV studies:   The following studies were reviewed today:  PAP compliance download  Past Medical History:  Diagnosis Date   Anticoagulant long-term use    xarelto   Balanitis    GERD (gastroesophageal reflux disease)    History of adenomatous polyp of colon    2006  tubular adenoma's and hyperplastic polyp's;   2011  tubular adenoma   History of atrial flutter    s/p ablation atrial flutter/atrial fib in 2010   History of MRSA infection    1990's  infected wound near surgerical area    History of repair of hiatal hernia    HTN (hypertension)    Obesity (BMI 30-39.9)    OSA on CPAP    Mild OSA  per study 05-07-2012-- w AHI 6.57/hr now on 13cm H2O   PAF (paroxysmal atrial fibrillation) [facility]) primary cardiologist-  dr Marlou Porch   ep cardiologist-  dr Rayann Heman---  s/p  ablation afib and aflutter 2010  and  ablation afib/svt  2013   Peripheral neuropathy, hereditary/idiopathic    bilateral feet---  neurologist- dr patel   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[place], 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 , fireman   S/P radiofrequency ablation operation for arrhythmia    01-09-2009  cardiac ablation afib/flutter;   09-13-2011  ablation afib/svt and in ther right atrium   Wears glasses    Past Surgical History:   Procedure Laterality Date   ATRIAL FIBRILLATION ABLATION N/A 09/13/2011   Procedure: ATRIAL FIBRILLATION ABLATION;  Surgeon: Thompson Grayer, MD;  Location: [facility] CATH LAB;  Service: Cardiovascular;  Laterality: N/A;   ATRIAL FIBRILLATION ABLATION N/A 02/01/2020   Procedure: ATRIAL FIBRILLATION ABLATION;  Surgeon: Thompson Grayer, MD;  Location: [place] CV LAB;  Service: Cardiovascular;  Laterality: N/A;   CARDIAC ELECTROPHYSIOLOGY MAPPING AND ABLATION  [phone]   dr allred   successful ablation atrial fibrillation /  atrial flutter   CARDIAC ELECTROPHYSIOLOGY MAPPING AND ABLATION  09-13-2011   dr Rayann Heman   afib mapping/ ablation and  mapping/ablation in the right atrium   CARDIOVASCULAR STRESS TEST  09/2006   per dr Marlou Porch note nuclear study normal w/ no ischemia   CARDIOVERSION N/A 09/21/2015   Procedure: CARDIOVERSION;  Surgeon: Lelon Perla, MD;  Location: [facility] ENDOSCOPY;  Service: Cardiovascular;  Laterality: N/A;   CIRCUMCISION N/A 06/27/2016   Procedure: CIRCUMCISION ADULT;  Surgeon: Carolan Clines, MD;  Location: [facility];  Service: Urology;  Laterality: N/A;   COLONOSCOPY  last one 02/  2011   DIRECT CURRENT CARDIOVERSION  02/02/2010   dr Leonia Reeves   LAPAROSCOPIC CHOLECYSTECTOMY  1990's   OPEN HIATAL HERNIA REPAIR  1970's  TEE WITHOUT CARDIOVERSION  09/12/2011   Procedure: TRANSESOPHAGEAL ECHOCARDIOGRAM (TEE);  Surgeon: Jettie Booze, MD;  Location: [facility] ENDOSCOPY;  Service: Cardiovascular;  Laterality: N/A;  trace AR, no LA/LAA thrombus, normal LVfunction, moderate descending aortic atherosclerosis   TEE WITHOUT CARDIOVERSION N/A 09/21/2015   Procedure: TRANSESOPHAGEAL ECHOCARDIOGRAM (TEE);  Surgeon: Lelon Perla, MD;  Location: [facility] ENDOSCOPY;  Service: Cardiovascular;  Laterality: N/A; normal LV systolic funciton,  moderate LAE,  no LAA thrombus,  trace MR and TR   TRANSTHORACIC ECHOCARDIOGRAM  06/03/2014   ef 55-60%/  trivial MR and TR     Current Meds   Medication Sig   Cyanocobalamin (VITAMIN B-12) 2500 MCG SUBL Take 2,500 mcg by mouth daily.   diltiazem (CARDIZEM CD) 120 MG 24 hr capsule TAKE 1 CAPSULE(120 MG) BY MOUTH IN THE MORNING AND AT BEDTIME   diltiazem (CARDIZEM) 30 MG tablet Take 1 tablet every 4 hours AS NEEDED for AFIB heart rate >100   omeprazole (PRILOSEC) 20 MG capsule Take 20 mg by mouth daily before breakfast.    rivaroxaban (XARELTO) 20 MG TABS tablet TAKE 1 TABLET BY MOUTH DAILY WITH SUPPER   tamsulosin (FLOMAX) 0.4 MG CAPS capsule Take 0.4 mg by mouth every 12 (twelve) hours.      Allergies:   Dextromethorphan   Social History   Tobacco Use   Smoking status: Never   Smokeless tobacco: Never  Vaping Use   Vaping Use: Never used  Substance Use Topics   Alcohol use: No    Alcohol/week: 1.0 standard drink    Types: 1 Cans of beer per week    Comment: rare   Drug use: No     Family Hx: The patient's family history includes CAD in his father; Heart attack in his father. There is no history of Atrial fibrillation or Anesthesia problems.  ROS:   Please see the history of present illness.     All other systems reviewed and are negative.   Labs/Other Tests and Data Reviewed:    Recent Labs: 10/31/2020: ALT 34; BUN 17; Creatinine, Ser 1.01; Hemoglobin 17.5; Magnesium 1.9; Platelets 152; Potassium 4.1; Sodium 138   Recent Lipid Panel No results found for: CHOL, TRIG, HDL, CHOLHDL, LDLCALC, LDLDIRECT  Wt Readings from Last 3 Encounters:  04/10/21 235 lb 9.6 oz (106.9 kg)  12/11/20 230 lb 12.8 oz (104.7 kg)  11/07/20 232 lb 9.6 oz (105.5 kg)     Objective:    Vital Signs:  BP 136/68   Pulse (!) 57   Ht 5\' 11"  (1.803 m)   Wt 235 lb 9.6 oz (106.9 kg)   SpO2 97%   BMI 32.86 kg/m    GEN: Well nourished, well developed in no acute distress HEENT: Normal NECK: No JVD; No carotid bruits LYMPHATICS: No lymphadenopathy CARDIAC:RRR, no murmurs, rubs, gallops RESPIRATORY:  Clear to auscultation without  rales, wheezing or rhonchi  ABDOMEN: Soft, non-tender, non-distended MUSCULOSKELETAL:  No edema; No deformity  SKIN: Warm and dry NEUROLOGIC:  Alert and oriented x 3 PSYCHIATRIC:  Normal affect    ASSESSMENT & PLAN:    1.  OSA - The patient is tolerating PAP therapy well without any problems. The PAP download performed by his DME was personally reviewed and interpreted by me today and showed an AHI of 0.4/hr on 7 cm H2O with 93% compliance in using more than 4 hours nightly.  The patient has been using and benefiting from PAP use and will continue to benefit from therapy.  2.  Obesity -I have encouraged him to get into a routine exercise program and cut back on carbs and portions.   3.  HTN -BP is controlled on exam today -continue prescription drug management with Cartia XT 120mg  daily with PRN refills   Medication Adjustments/Labs and Tests Ordered: Current medicines are reviewed at length with the patient today.  Concerns regarding medicines are outlined above.  Tests Ordered: No orders of the defined types were placed in this encounter.  Medication Changes: No orders of the defined types were placed in this encounter.   Disposition:  Follow up 10 weeks after he gets his new PAP device  Signed, Fransico Him, MD  04/10/2021 11:12 AM    [place]

## 2021-04-23 ENCOUNTER — Other Ambulatory Visit (HOSPITAL_COMMUNITY): Payer: Self-pay | Admitting: Nurse Practitioner

## 2021-05-30 ENCOUNTER — Telehealth: Payer: Self-pay | Admitting: *Deleted

## 2021-05-30 DIAGNOSIS — I480 Paroxysmal atrial fibrillation: Secondary | ICD-10-CM | POA: Diagnosis not present

## 2021-05-30 DIAGNOSIS — R931 Abnormal findings on diagnostic imaging of heart and coronary circulation: Secondary | ICD-10-CM

## 2021-05-30 DIAGNOSIS — Z86010 Personal history of colonic polyps: Secondary | ICD-10-CM | POA: Diagnosis not present

## 2021-05-30 NOTE — Telephone Encounter (Signed)
Pharmacy, can you please comment on how long Xarelto can be held for upcoming procedure?  Thank you! 

## 2021-05-30 NOTE — Telephone Encounter (Signed)
   Pre-operative Risk Assessment    Patient Name: Carlos Thomas  DOB: July 13, 1950 MRN: [id number]      Request for Surgical Clearance   Procedure:   COLONOSCOPY  Date of Surgery: Clearance 09/12/21                                 Surgeon:  DR. MARC MAGOD Surgeon's Group or Practice Name:  [facility] Phone number:  [phone] Fax number:  [phone]   Type of Clearance Requested: - Medical  - Pharmacy:  Hold Rivaroxaban (Xarelto) x 3 DAYS PRIOR   Type of Anesthesia:   PROPOFOL   Additional requests/questions:   Jiles Prows   05/30/2021, 2:06 PM

## 2021-05-31 DIAGNOSIS — R931 Abnormal findings on diagnostic imaging of heart and coronary circulation: Secondary | ICD-10-CM | POA: Insufficient documentation

## 2021-05-31 NOTE — Telephone Encounter (Signed)
Patient with diagnosis of afib on Xarelto for anticoagulation.    Procedure: colonoscopy Date of procedure: 09/12/21  CHA2DS2-VASc Score = 3  This indicates a 3.2% annual risk of stroke. The patient's score is based upon: CHF History: 0 HTN History: 1 Diabetes History: 0 Stroke History: 0 Vascular Disease History: 1 Age Score: 1 Gender Score: 0   Underwent DCCV 10/31/20.  CrCl 44mL/min using adjusted body weight Platelet count 152K  Request is to hold Xarelto for 3 days prior to colonoscopy. Should not require 3 day DOAC hold for procedure. Per office protocol, patient can hold Xarelto for 1-2 days prior to procedure.

## 2021-06-01 NOTE — Telephone Encounter (Signed)
   Primary Cardiologist: Candee Furbish, MD  Chart reviewed as part of pre-operative protocol coverage. Given past medical history and time since last visit, based on ACC/AHA guidelines, Carlos Thomas would be at acceptable risk for the planned procedure without further cardiovascular testing.   Patient with diagnosis of afib on Xarelto for anticoagulation.     Procedure: colonoscopy Date of procedure: 09/12/21   CHA2DS2-VASc Score = 3  This indicates a 3.2% annual risk of stroke. The patient's score is based upon: CHF History: 0 HTN History: 1 Diabetes History: 0 Stroke History: 0 Vascular Disease History: 1 Age Score: 1 Gender Score: 0    Underwent DCCV 10/31/20.   CrCl 53mL/min using adjusted body weight Platelet count 152K   Request is to hold Xarelto for 3 days prior to colonoscopy. Should not require 3 day DOAC hold for procedure. Per office protocol, patient can hold Xarelto for 1-2 days prior to procedure.  I will route this recommendation to the requesting party via Epic fax function and remove from pre-op pool.  Please call with questions.  Jossie Ng. Babbette Dalesandro NP-C    06/01/2021, 8:07 AM [place] [address] Office [phone] Fax [phone]

## 2021-06-12 DIAGNOSIS — Z0279 Encounter for issue of other medical certificate: Secondary | ICD-10-CM

## 2021-06-13 ENCOUNTER — Telehealth: Payer: Self-pay | Admitting: Internal Medicine

## 2021-06-13 NOTE — Telephone Encounter (Signed)
Forms from [employer] received on 06/13/2021. Completed patient auth attached. Took form to Dr. Rayann Heman box for completion. 06/13/2021 JMM

## 2021-06-13 NOTE — Telephone Encounter (Signed)
Patient has an [employer] form that needs to be completed.  He has an appointment to see Dr. Rayann Heman on 06/18/21.  The form requires that he have a mulit-lead monitor placed and the results of the monitor to be indicated on the form.

## 2021-06-15 NOTE — Telephone Encounter (Signed)
Okay per MD to order and set up monitor.

## 2021-06-16 ENCOUNTER — Other Ambulatory Visit (HOSPITAL_COMMUNITY): Payer: Self-pay | Admitting: Nurse Practitioner

## 2021-06-18 ENCOUNTER — Encounter: Payer: Self-pay | Admitting: Internal Medicine

## 2021-06-18 ENCOUNTER — Other Ambulatory Visit: Payer: Self-pay

## 2021-06-18 ENCOUNTER — Ambulatory Visit (INDEPENDENT_AMBULATORY_CARE_PROVIDER_SITE_OTHER): Payer: Medicare PPO

## 2021-06-18 ENCOUNTER — Ambulatory Visit: Payer: Medicare PPO | Admitting: Internal Medicine

## 2021-06-18 VITALS — BP 144/2 | HR 60 | Ht 71.0 in | Wt 238.6 lb

## 2021-06-18 DIAGNOSIS — I4819 Other persistent atrial fibrillation: Secondary | ICD-10-CM

## 2021-06-18 DIAGNOSIS — G4733 Obstructive sleep apnea (adult) (pediatric): Secondary | ICD-10-CM | POA: Diagnosis not present

## 2021-06-18 DIAGNOSIS — I10 Essential (primary) hypertension: Secondary | ICD-10-CM

## 2021-06-18 NOTE — Telephone Encounter (Signed)
Ordered 24 hour monitor required. Will complete paper work once results are back.

## 2021-06-18 NOTE — Patient Instructions (Signed)
Medication Instructions:  Your physician recommends that you continue on your current medications as directed. Please refer to the Current Medication list given to you today. *If you need a refill on your cardiac medications before your next appointment, please call your pharmacy*  Lab Work: None. If you have labs (blood work) drawn today and your tests are completely normal, you will receive your results only by: [place] (if you have MyChart) OR A paper copy in the mail If you have any lab test that is abnormal or we need to change your treatment, we will call you to review the results.  Testing/Procedures: None.  Follow-Up: At [facility], you and your health needs are our priority.  As part of our continuing mission to provide you with exceptional heart care, we have created designated Provider Care Teams.  These Care Teams include your primary Cardiologist (physician) and Advanced Practice Providers (APPs -  Physician Assistants and Nurse Practitioners) who all work together to provide you with the care you need, when you need it.  Your physician wants you to follow-up in: 6 month with the Afib Clinic. They will contact you to schedule.   We recommend signing up for the patient portal called "MyChart".  Sign up information is provided on this After Visit Summary.  MyChart is used to connect with patients for Virtual Visits (Telemedicine).  Patients are able to view lab/test results, encounter notes, upcoming appointments, etc.  Non-urgent messages can be sent to your provider as well.   To learn more about what you can do with MyChart, go to [email or web address].    Any Other Special Instructions Will Be Listed Below (If Applicable).

## 2021-06-18 NOTE — Telephone Encounter (Signed)
Monitor ordered and applied

## 2021-06-18 NOTE — Progress Notes (Signed)
PCP: Shirline Frees, MD Primary Cardiologist: Dr Marlou Porch Primary EP: Dr Loney Hering Carlos Thomas is a 70 y.o. male who presents today for routine electrophysiology followup.  Since last being seen in our clinic, the patient reports doing very well.  Today, he denies symptoms of palpitations, chest pain, shortness of breath,  lower extremity edema, dizziness, presyncope, or syncope.  The patient is otherwise without complaint today.   Past Medical History:  Diagnosis Date   Anticoagulant long-term use    xarelto   Balanitis    GERD (gastroesophageal reflux disease)    History of adenomatous polyp of colon    2006  tubular adenoma's and hyperplastic polyp's;   2011  tubular adenoma   History of atrial flutter    s/p ablation atrial flutter/atrial fib in 2010   History of MRSA infection    1990's  infected wound near surgerical area    History of repair of hiatal hernia    HTN (hypertension)    Obesity (BMI 30-39.9)    OSA on CPAP    Mild OSA  per study 05-07-2012-- w AHI 6.57/hr now on 13cm H2O   PAF (paroxysmal atrial fibrillation) [facility]) primary cardiologist-  dr Marlou Porch   ep cardiologist-  dr Rayann Heman---  s/p  ablation afib and aflutter 2010  and  ablation afib/svt  2013   Peripheral neuropathy, hereditary/idiopathic    bilateral feet---  neurologist- dr patel   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[place], 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 , fireman   S/P radiofrequency ablation operation for arrhythmia    01-09-2009  cardiac ablation afib/flutter;   09-13-2011  ablation afib/svt and in ther right atrium   Wears glasses    Past Surgical History:  Procedure Laterality Date   ATRIAL FIBRILLATION ABLATION N/A 09/13/2011   Procedure: ATRIAL FIBRILLATION ABLATION;  Surgeon: Thompson Grayer, MD;  Location: [facility] CATH LAB;  Service: Cardiovascular;  Laterality: N/A;   ATRIAL FIBRILLATION ABLATION N/A 02/01/2020   Procedure: ATRIAL FIBRILLATION ABLATION;  Surgeon: Thompson Grayer, MD;  Location: [place] CV LAB;   Service: Cardiovascular;  Laterality: N/A;   CARDIAC ELECTROPHYSIOLOGY MAPPING AND ABLATION  [phone]   dr Meleni Delahunt   successful ablation atrial fibrillation /  atrial flutter   CARDIAC ELECTROPHYSIOLOGY MAPPING AND ABLATION  09-13-2011   dr Rayann Heman   afib mapping/ ablation and  mapping/ablation in the right atrium   CARDIOVASCULAR STRESS TEST  09/2006   per dr Marlou Porch note nuclear study normal w/ no ischemia   CARDIOVERSION N/A 09/21/2015   Procedure: CARDIOVERSION;  Surgeon: Lelon Perla, MD;  Location: [facility] ENDOSCOPY;  Service: Cardiovascular;  Laterality: N/A;   CIRCUMCISION N/A 06/27/2016   Procedure: CIRCUMCISION ADULT;  Surgeon: Carolan Clines, MD;  Location[facility];  Service: Urology;  Laterality: N/A;   COLONOSCOPY  last one 02/  2011   DIRECT CURRENT CARDIOVERSION  02/02/2010   dr Leonia Reeves   LAPAROSCOPIC CHOLECYSTECTOMY  1990's   OPEN HIATAL HERNIA REPAIR  1970's   TEE WITHOUT CARDIOVERSION  09/12/2011   Procedure: TRANSESOPHAGEAL ECHOCARDIOGRAM (TEE);  Surgeon: Jettie Booze, MD;  Location: [facility] ENDOSCOPY;  Service: Cardiovascular;  Laterality: N/A;  trace AR, no LA/LAA thrombus, normal LVfunction, moderate descending aortic atherosclerosis   TEE WITHOUT CARDIOVERSION N/A 09/21/2015   Procedure: TRANSESOPHAGEAL ECHOCARDIOGRAM (TEE);  Surgeon: Lelon Perla, MD;  Location: [facility] ENDOSCOPY;  Service: Cardiovascular;  Laterality: N/A; normal LV systolic funciton,  moderate LAE,  no LAA thrombus,  trace MR and TR  TRANSTHORACIC ECHOCARDIOGRAM  06/03/2014   ef 55-60%/  trivial MR and TR    ROS- all systems are reviewed and negatives except as per HPI above  Current Outpatient Medications  Medication Sig Dispense Refill   Cyanocobalamin (VITAMIN B-12) 2500 MCG SUBL Take 2,500 mcg by mouth daily.     diltiazem (CARDIZEM CD) 120 MG 24 hr capsule TAKE 1 CAPSULE(120 MG) BY MOUTH IN THE MORNING AND AT BEDTIME 60 capsule 6   diltiazem (CARDIZEM) 30 MG tablet  TAKE 1 TABLET EVERY 4 HOURS AS NEEDED A-FIB, HEART RATE OVER 100 45 tablet 1   omeprazole (PRILOSEC) 20 MG capsule Take 20 mg by mouth daily before breakfast.      rivaroxaban (XARELTO) 20 MG TABS tablet TAKE 1 TABLET BY MOUTH DAILY WITH SUPPER 90 tablet 2   tamsulosin (FLOMAX) 0.4 MG CAPS capsule Take 0.4 mg by mouth every 12 (twelve) hours.      No current facility-administered medications for this visit.    Physical Exam: Vitals:   06/18/21 1041  BP: (!) 144/2  Pulse: 60  SpO2: 95%  Weight: 238 lb 9.6 oz (108.2 kg)  Height: 5\' 11"  (1.803 m)    GEN- The patient is well appearing, alert and oriented x 3 today.   Head- normocephalic, atraumatic Eyes-  Sclera clear, conjunctiva pink Ears- hearing intact Oropharynx- clear Lungs- Clear to ausculation bilaterally, normal work of breathing Heart- Regular rate and rhythm, no murmurs, rubs or gallops, PMI not laterally displaced GI- soft, NT, ND, + BS Extremities- no clubbing, cyanosis, or edema  Wt Readings from Last 3 Encounters:  06/18/21 238 lb 9.6 oz (108.2 kg)  04/10/21 235 lb 9.6 oz (106.9 kg)  12/11/20 230 lb 12.8 oz (104.7 kg)    EKG tracing ordered today is personally reviewed and shows sinus  Assessment and Plan:  Persistent afib Doing well post ablation off AAD therapy No arrhythmias since last visit Chads2vasc score is 2.  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  No changes are advised at this time.  2. HTN Stable No change required today  3. OSA Uses CPAP  Risks, benefits and potential toxicities for medications prescribed and/or refilled reviewed with patient today.   Return to AF clinic every 6 months  Thompson Grayer MD, [facility] 06/18/2021 10:49 AM

## 2021-06-19 DIAGNOSIS — I10 Essential (primary) hypertension: Secondary | ICD-10-CM

## 2021-06-19 DIAGNOSIS — K219 Gastro-esophageal reflux disease without esophagitis: Secondary | ICD-10-CM | POA: Diagnosis not present

## 2021-06-19 DIAGNOSIS — G4733 Obstructive sleep apnea (adult) (pediatric): Secondary | ICD-10-CM

## 2021-06-19 DIAGNOSIS — I4819 Other persistent atrial fibrillation: Secondary | ICD-10-CM

## 2021-06-19 DIAGNOSIS — I480 Paroxysmal atrial fibrillation: Secondary | ICD-10-CM | POA: Diagnosis not present

## 2021-06-19 DIAGNOSIS — N401 Enlarged prostate with lower urinary tract symptoms: Secondary | ICD-10-CM | POA: Diagnosis not present

## 2021-06-26 ENCOUNTER — Other Ambulatory Visit (HOSPITAL_COMMUNITY): Payer: Self-pay | Admitting: *Deleted

## 2021-06-26 DIAGNOSIS — I480 Paroxysmal atrial fibrillation: Secondary | ICD-10-CM

## 2021-06-26 MED ORDER — RIVAROXABAN 20 MG PO TABS: 20.0000 mg | ORAL_TABLET | Freq: Every day | ORAL | 2 refills | Status: DC

## 2021-06-27 ENCOUNTER — Telehealth: Payer: Self-pay | Admitting: Internal Medicine

## 2021-06-27 NOTE — Telephone Encounter (Signed)
Form completed by Dr. Rayann Heman and patient has been notified to pick up paper work. 06/27/2021 JMM

## 2021-06-27 NOTE — Telephone Encounter (Signed)
Completed paperwork dropped off at medical records on 06/26/21.

## 2021-07-09 ENCOUNTER — Ambulatory Visit: Payer: Medicare PPO | Admitting: Internal Medicine

## 2021-08-29 DIAGNOSIS — T1510XA Foreign body in conjunctival sac, unspecified eye, initial encounter: Secondary | ICD-10-CM | POA: Diagnosis not present

## 2021-09-12 DIAGNOSIS — K5730 Diverticulosis of large intestine without perforation or abscess without bleeding: Secondary | ICD-10-CM | POA: Diagnosis not present

## 2021-09-12 DIAGNOSIS — D123 Benign neoplasm of transverse colon: Secondary | ICD-10-CM | POA: Diagnosis not present

## 2021-09-12 DIAGNOSIS — Z86010 Personal history of colonic polyps: Secondary | ICD-10-CM | POA: Diagnosis not present

## 2021-09-12 DIAGNOSIS — K649 Unspecified hemorrhoids: Secondary | ICD-10-CM | POA: Diagnosis not present

## 2021-09-14 DIAGNOSIS — D123 Benign neoplasm of transverse colon: Secondary | ICD-10-CM | POA: Diagnosis not present

## 2021-11-13 DIAGNOSIS — L918 Other hypertrophic disorders of the skin: Secondary | ICD-10-CM | POA: Diagnosis not present

## 2021-11-13 DIAGNOSIS — D225 Melanocytic nevi of trunk: Secondary | ICD-10-CM | POA: Diagnosis not present

## 2021-11-13 DIAGNOSIS — L578 Other skin changes due to chronic exposure to nonionizing radiation: Secondary | ICD-10-CM | POA: Diagnosis not present

## 2021-11-13 DIAGNOSIS — L820 Inflamed seborrheic keratosis: Secondary | ICD-10-CM | POA: Diagnosis not present

## 2022-01-18 ENCOUNTER — Other Ambulatory Visit (HOSPITAL_COMMUNITY): Payer: Self-pay | Admitting: *Deleted

## 2022-01-18 DIAGNOSIS — L0390 Cellulitis, unspecified: Secondary | ICD-10-CM | POA: Diagnosis not present

## 2022-01-18 MED ORDER — DILTIAZEM HCL ER COATED BEADS 120 MG PO CP24: 120.0000 mg | ORAL_CAPSULE | Freq: Two times a day (BID) | ORAL | 6 refills | Status: DC

## 2022-02-21 ENCOUNTER — Other Ambulatory Visit: Payer: Self-pay

## 2022-02-21 NOTE — Patient Outreach (Signed)
[place] [facility]) Care Management  02/21/2022  Espen [facility] May 27, 1951 [id number]   Telephone call to patient for nurse call.  No answer.  HIPAA compliant voice message left.    Plan: RN CM will attempt again within 4 business days and send letter.  Jone Baseman, RN, MSN [facility] Care Management Care Management Coordinator Direct Line [phone] Toll Free: [phone]  Fax: [phone]

## 2022-02-27 ENCOUNTER — Other Ambulatory Visit: Payer: Self-pay

## 2022-02-27 NOTE — Patient Outreach (Signed)
[place] [facility]) Care Management  02/27/2022  Jerold Yoss [facility] 02/01/1951 [id number]   Telephone call to patient for nurse call.  No answer.  HIPAA compliant voice message left.    Plan: RN CM will attempt again next week.   Jone Baseman, RN, MSN [facility] Care Management Care Management Coordinator Direct Line [phone] Toll Free: [phone]  Fax: [phone]

## 2022-03-01 ENCOUNTER — Other Ambulatory Visit: Payer: Self-pay

## 2022-03-01 NOTE — Patient Outreach (Signed)
  Care Coordination   03/01/2022 Name: Carlos Thomas MRN: [id number] DOB: 06-May-1951   Care Coordination Outreach Attempts:  An unsuccessful telephone outreach was attempted today to offer the patient information about available care coordination services as a benefit of their health plan.   Follow Up Plan:  No further outreach attempts will be made at this time. We have been unable to contact the patient to offer or enroll patient in care coordination services  Encounter Outcome:  No Answer  Care Coordination Interventions Activated:  No   Care Coordination Interventions:  No, not indicated    Jone Baseman, RN, MSN [facility] Care Management Coordinator Direct Line [phone] Toll Free: [phone]  Fax: [phone]

## 2022-03-27 ENCOUNTER — Other Ambulatory Visit (HOSPITAL_COMMUNITY): Payer: Self-pay

## 2022-03-27 DIAGNOSIS — I480 Paroxysmal atrial fibrillation: Secondary | ICD-10-CM

## 2022-03-27 MED ORDER — RIVAROXABAN 20 MG PO TABS: 20.0000 mg | ORAL_TABLET | Freq: Every day | ORAL | 0 refills | Status: DC

## 2022-04-01 DIAGNOSIS — R3915 Urgency of urination: Secondary | ICD-10-CM | POA: Diagnosis not present

## 2022-04-01 DIAGNOSIS — N401 Enlarged prostate with lower urinary tract symptoms: Secondary | ICD-10-CM | POA: Diagnosis not present

## 2022-06-27 ENCOUNTER — Other Ambulatory Visit (HOSPITAL_COMMUNITY): Payer: Self-pay | Admitting: Nurse Practitioner

## 2022-06-27 DIAGNOSIS — I10 Essential (primary) hypertension: Secondary | ICD-10-CM | POA: Diagnosis not present

## 2022-06-27 DIAGNOSIS — I480 Paroxysmal atrial fibrillation: Secondary | ICD-10-CM

## 2022-06-27 DIAGNOSIS — Z125 Encounter for screening for malignant neoplasm of prostate: Secondary | ICD-10-CM | POA: Diagnosis not present

## 2022-06-27 DIAGNOSIS — Z23 Encounter for immunization: Secondary | ICD-10-CM | POA: Diagnosis not present

## 2022-06-27 DIAGNOSIS — R351 Nocturia: Secondary | ICD-10-CM | POA: Diagnosis not present

## 2022-06-27 DIAGNOSIS — G609 Hereditary and idiopathic neuropathy, unspecified: Secondary | ICD-10-CM | POA: Diagnosis not present

## 2022-06-27 DIAGNOSIS — K219 Gastro-esophageal reflux disease without esophagitis: Secondary | ICD-10-CM | POA: Diagnosis not present

## 2022-06-27 DIAGNOSIS — G4733 Obstructive sleep apnea (adult) (pediatric): Secondary | ICD-10-CM | POA: Diagnosis not present

## 2022-06-27 DIAGNOSIS — N401 Enlarged prostate with lower urinary tract symptoms: Secondary | ICD-10-CM | POA: Diagnosis not present

## 2022-06-28 ENCOUNTER — Other Ambulatory Visit (HOSPITAL_COMMUNITY): Payer: Self-pay

## 2022-06-28 DIAGNOSIS — I480 Paroxysmal atrial fibrillation: Secondary | ICD-10-CM

## 2022-06-28 MED ORDER — RIVAROXABAN 20 MG PO TABS: 20.0000 mg | ORAL_TABLET | Freq: Every day | ORAL | 0 refills | Status: DC

## 2022-07-08 ENCOUNTER — Ambulatory Visit
Admission: RE | Admit: 2022-07-08 | Discharge: 2022-07-08 | Disposition: A | Discharge status: Home / Self Care | Payer: Medicare PPO | Source: Ambulatory Visit | Attending: Physician Assistant | Admitting: Physician Assistant

## 2022-07-08 ENCOUNTER — Other Ambulatory Visit: Payer: Self-pay | Admitting: Physician Assistant

## 2022-07-08 DIAGNOSIS — M79671 Pain in right foot: Secondary | ICD-10-CM | POA: Diagnosis not present

## 2022-07-08 DIAGNOSIS — S9781XA Crushing injury of right foot, initial encounter: Secondary | ICD-10-CM | POA: Diagnosis not present

## 2022-07-08 DIAGNOSIS — Z23 Encounter for immunization: Secondary | ICD-10-CM | POA: Diagnosis not present

## 2022-07-17 ENCOUNTER — Ambulatory Visit (HOSPITAL_COMMUNITY)
Admission: RE | Admit: 2022-07-17 | Discharge: 2022-07-17 | Disposition: A | Discharge status: Home / Self Care | Payer: Medicare PPO | Source: Ambulatory Visit | Attending: Nurse Practitioner | Admitting: Nurse Practitioner

## 2022-07-17 VITALS — BP 152/72 | HR 53 | Ht 71.0 in | Wt 236.8 lb

## 2022-07-17 DIAGNOSIS — Z79899 Other long term (current) drug therapy: Secondary | ICD-10-CM | POA: Diagnosis not present

## 2022-07-17 DIAGNOSIS — I480 Paroxysmal atrial fibrillation: Secondary | ICD-10-CM | POA: Diagnosis not present

## 2022-07-17 DIAGNOSIS — G629 Polyneuropathy, unspecified: Secondary | ICD-10-CM | POA: Insufficient documentation

## 2022-07-17 DIAGNOSIS — D6869 Other thrombophilia: Secondary | ICD-10-CM

## 2022-07-17 DIAGNOSIS — G4733 Obstructive sleep apnea (adult) (pediatric): Secondary | ICD-10-CM | POA: Insufficient documentation

## 2022-07-17 DIAGNOSIS — I4892 Unspecified atrial flutter: Secondary | ICD-10-CM

## 2022-07-17 DIAGNOSIS — I10 Essential (primary) hypertension: Secondary | ICD-10-CM | POA: Diagnosis not present

## 2022-07-17 DIAGNOSIS — Z7901 Long term (current) use of anticoagulants: Secondary | ICD-10-CM | POA: Insufficient documentation

## 2022-07-17 DIAGNOSIS — I445 Left posterior fascicular block: Secondary | ICD-10-CM | POA: Insufficient documentation

## 2022-07-17 MED ORDER — DILTIAZEM HCL ER COATED BEADS 120 MG PO CP24: 120.0000 mg | ORAL_CAPSULE | Freq: Two times a day (BID) | ORAL | 6 refills | Status: DC

## 2022-07-17 MED ORDER — RIVAROXABAN 20 MG PO TABS: 20.0000 mg | ORAL_TABLET | Freq: Every day | ORAL | 1 refills | Status: DC

## 2022-07-17 MED ORDER — DILTIAZEM HCL 30 MG PO TABS: ORAL_TABLET | ORAL | 1 refills | Status: AC

## 2022-07-17 NOTE — Progress Notes (Signed)
Primary Care Physician: Shirline Frees, MD Referring Physician: [facility] ER f/u EP: Dr. Loney Hering Carlos Thomas is a 72 y.o. male with a h/o afib s/p w ablations, 2010 and 2013 that intermittently requires cardioversion, on flecainide. Pt was involved in a traffic situation that escalated his HR on Friday and immediately felt his heart go into afib. He thought he took an extra flecainide but by mistake, he took a nausea pill. He went to the  ER on Saturday but the ER doc did not want to cardiovert as he was rate controlled. He returned on Sunday to the ER with afib around 120 bpm and received successful  cardioversion.  On last visit in April 2019, he continued to maintain S brady at 50 bpm. Continues on xarelto 20 mg daily. He was given an extra 120 mg diltiazem to add to his 240 mg daily Cardizem in the ER.  F/u afib clinic, 12/6. He is in SR and has not had any afib but has neuropathy and his neurologist said that flecainide may be worsening his symptoms. He says that he has had neuropathy  for awhile and the symptoms are stable. He does not want to stop flecainide but wanted to know our experience with this.   F/u in afib clinic, 09/28/19. I have not seen pt since 2019. He decided to stay on the flecainide after his last visit as  we discussed staying on flecainide despite it may be worsening his peripheral neuropathy as mentioned by his neurologist. He states that it has stayed about the same and he prefers to continue its use. He has not noted any afib. EKG shows sinus brady at 46 bpm, LAFB.   F/u in afib clinic, 5/27. He had a very busy day yesterday, doing multiple activities outside around a lot of pollen. He coughed  hard last pm and wento out of rhythm. He took his Cardizem and his flecainide and when he was still out of rhythm a couple hours later, he went to med [facility]. After unsuccessfully  trying to convert with IV Cardizem, he was successfully cardioverted. He did have some  issues  with hypotension and bradycardia afterward. He did not leave there until 7:30 this am. Now his BP is normal and he remains  in sinus brady at 53 bpm. He is very tired for being up all night.   F/u in afib clinic, 12/01/19. He had another cardioversion 5/29 and is quite upset that he has had 2 episodes in the last few days. We discussed that he did not go to have an urgent cardioversion if he was stable. He does come out of cardioversion bradycardic and with low BP with an extra 120 mg Cardizem po  or at the time of his first DCCV, Cardizem was used first IV. We did discuss that he could use 30 mg Cardizem that may convert without going to the  ER and not drop his BP/HR as much. He has himself gone back up to 120 mg bid of Cardizem but have asked him not to increase further as he had HR's in the 40's with higher doses of CCB. Now HR mid 50's. He had an atrial flutter ablation in 2010 and an afib ablation in 2013 and may be time to see Dr. Rayann Heman  for an repeat afib ablation as he has had a very long time in [place]. He will need updated echo. The other option would be to change antiarrythmic to Tikosyn.  F/u in afib ablation 02/29/20.He is now one month s/p ablation. He only has noted one episode afib and that was drinking regular coffee given to him in a restaurant when he had asked for decaf. He took an extra cardizem 30 mg and it calmed down quickly. He continues on flecainide/cardizem as well as xarelto 20 mg daily for a CHA2DS2VASc dcore of at least 2.   F/u in afib clinic, 5/10, after cardioversion in the ER for atrial flutter at 150 bpm. He presented there after flecainde x 2 and prn cardizem did not touch the 150 bpm rate.  It was successful and he remains in SR. Feels that family stress trigger this episode. No further arrhythmia since CV. He has noted a few elevated  BP's in the pm around 865 systolic. Encouraged to keep readings to review with Dr. Rayann Heman in June. 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 Rayann Heman.   F/u in afib clinic, 07/17/22, to f/u Dr. Jackalyn Lombard  last appointment  December 2022.  He states he has not noted any afib. Off flecainide  for some time. BP mildly elevated in office, at home stable. EKG shows sinus brady at 53 bpm. Will call and get recent lab results form PCP.   Today, he denies symptoms of palpitations, chest pain, shortness of breath, orthopnea, PND, lower extremity edema, dizziness, presyncope, syncope, or neurologic sequela. The patient is tolerating medications without difficulties and is otherwise without complaint today.   Past Medical History:  Diagnosis Date   Anticoagulant long-term use    xarelto   Balanitis    GERD (gastroesophageal reflux disease)    History of adenomatous polyp of colon    2006  tubular adenoma's and hyperplastic polyp's;   2011  tubular adenoma   History of atrial flutter    s/p ablation atrial flutter/atrial fib in 2010   History of MRSA infection    1990's  infected wound near surgerical area    History of repair of hiatal hernia    HTN (hypertension)    Obesity (BMI 30-39.9)    OSA on CPAP    Mild OSA  per study 05-07-2012-- w [facility] 6.57/hr now on 13cm H2O   PAF (paroxysmal atrial fibrillation) [facility]) primary cardiologist-  dr Marlou Porch   ep cardiologist-  dr Rayann Heman---  s/p  ablation afib and aflutter 2010  and  ablation afib/svt  2013   Peripheral neuropathy, hereditary/idiopathic    bilateral feet---  neurologist- dr patel   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[place], 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 , fireman   S/P radiofrequency ablation operation for arrhythmia    01-09-2009  cardiac ablation afib/flutter;   09-13-2011  ablation afib/svt and in ther right atrium   Wears glasses    Past Surgical History:  Procedure Laterality Date   ATRIAL FIBRILLATION ABLATION N/A 09/13/2011   Procedure: ATRIAL FIBRILLATION ABLATION;  Surgeon: Thompson Grayer, MD;  Location: [facility] CATH LAB;   Service: Cardiovascular;  Laterality: N/A;   ATRIAL FIBRILLATION ABLATION N/A 02/01/2020   Procedure: ATRIAL FIBRILLATION ABLATION;  Surgeon: Thompson Grayer, MD;  Location: [place] CV LAB;  Service: Cardiovascular;  Laterality: N/A;   CARDIAC ELECTROPHYSIOLOGY MAPPING AND ABLATION  [phone]   dr allred   successful ablation atrial fibrillation /  atrial flutter   CARDIAC ELECTROPHYSIOLOGY MAPPING AND ABLATION  09-13-2011   dr Rayann Heman   afib mapping/ ablation and  mapping/ablation in the right  atrium   CARDIOVASCULAR STRESS TEST  09/2006   per dr Marlou Porch note nuclear study normal w/ no ischemia   CARDIOVERSION N/A 09/21/2015   Procedure: CARDIOVERSION;  Surgeon: Lelon Perla, MD;  Location: [facility] ENDOSCOPY;  Service: Cardiovascular;  Laterality: N/A;   CIRCUMCISION N/A 06/27/2016   Procedure: CIRCUMCISION ADULT;  Surgeon: Carolan Clines, MD;  Location: [facility];  Service: Urology;  Laterality: N/A;   COLONOSCOPY  last one 02/  2011   DIRECT CURRENT CARDIOVERSION  02/02/2010   dr Leonia Reeves   LAPAROSCOPIC CHOLECYSTECTOMY  1990's   OPEN HIATAL HERNIA REPAIR  1970's   TEE WITHOUT CARDIOVERSION  09/12/2011   Procedure: TRANSESOPHAGEAL ECHOCARDIOGRAM (TEE);  Surgeon: Jettie Booze, MD;  Location: [facility] ENDOSCOPY;  Service: Cardiovascular;  Laterality: N/A;  trace AR, no LA/LAA thrombus, normal LVfunction, moderate descending aortic atherosclerosis   TEE WITHOUT CARDIOVERSION N/A 09/21/2015   Procedure: TRANSESOPHAGEAL ECHOCARDIOGRAM (TEE);  Surgeon: Lelon Perla, MD;  Location: [facility] ENDOSCOPY;  Service: Cardiovascular;  Laterality: N/A; normal LV systolic funciton,  moderate LAE,  no LAA thrombus,  trace MR and TR   TRANSTHORACIC ECHOCARDIOGRAM  06/03/2014   ef 55-60%/  trivial MR and TR    Current Outpatient Medications  Medication Sig Dispense Refill   clotrimazole-betamethasone (LOTRISONE) cream Apply 1 Application topically as needed.     Cyanocobalamin (VITAMIN  B-12) 2500 MCG SUBL Take 2,500 mcg by mouth daily.     omeprazole (PRILOSEC) 20 MG capsule Take 20 mg by mouth daily before breakfast.      tamsulosin (FLOMAX) 0.4 MG CAPS capsule Take 0.4 mg by mouth every 12 (twelve) hours.      diltiazem (CARDIZEM CD) 120 MG 24 hr capsule Take 1 capsule (120 mg total) by mouth 2 (two) times daily. 60 capsule 6   diltiazem (CARDIZEM) 30 MG tablet TAKE 1 TABLET EVERY 4 HOURS AS NEEDED A-FIB, HEART RATE OVER 100 45 tablet 1   rivaroxaban (XARELTO) 20 MG TABS tablet Take 1 tablet (20 mg total) by mouth daily with supper. 90 tablet 1   No current facility-administered medications for this encounter.    Allergies  Allergen Reactions   Dextromethorphan Other (See Comments)    Triggers afib/hypertension    Social History   Socioeconomic History   Marital status: Divorced    Spouse name: Not on file   Number of children: Not on file   Years of education: Not on file   Highest education level: Not on file  Occupational History   Not on file  Tobacco Use   Smoking status: Never   Smokeless tobacco: Never  Vaping Use   Vaping Use: Never used  Substance and Sexual Activity   Alcohol use: No    Alcohol/week: 1.0 standard drink of alcohol    Types: 1 Cans of beer per week    Comment: rare   Drug use: No   Sexual activity: Not on file  Other Topics Concern   Not on file  Social History Narrative   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  Education: college.   Social Determinants of Health   Financial Resource Strain: Not on file  Food Insecurity: Not on file  Transportation Needs: Not on file  Physical Activity: Not on file  Stress: Not on file  Social Connections: Not on file  Intimate Partner Violence: Not on file    Family History  Problem  Relation Age of Onset   CAD Father    Heart attack Father    Atrial fibrillation Neg Hx    Anesthesia problems Neg Hx     ROS-  All systems are reviewed and negative except as per the HPI above  Physical Exam: Vitals:   07/17/22 1427  BP: (!) 152/72  Pulse: (!) 53  Weight: 107.4 kg  Height: '5\' 11"'$  (1.803 m)   Wt Readings from Last 3 Encounters:  07/17/22 107.4 kg  06/18/21 108.2 kg  04/10/21 106.9 kg    Labs: Lab Results  Component Value Date   NA 138 10/31/2020   K 4.1 10/31/2020   CL 102 10/31/2020   CO2 26 10/31/2020   GLUCOSE 125 (H) 10/31/2020   BUN 17 10/31/2020   CREATININE 1.01 10/31/2020   CALCIUM 9.3 10/31/2020   MG 1.9 10/31/2020   Lab Results  Component Value Date   INR 1.26 10/20/2016   No results found for: "CHOL", "HDL", "LDLCALC", "TRIG"   GEN- The patient is well appearing, alert and oriented x 3 today.   Head- normocephalic, atraumatic Eyes-  Sclera clear, conjunctiva pink Ears- hearing intact Oropharynx- clear Neck- supple, no JVP Lymph- no cervical lymphadenopathy Lungs- Clear to ausculation bilaterally, normal work of breathing Heart- slow regular rate and rhythm, no murmurs, rubs or gallops, PMI not laterally displaced GI- soft, NT, ND, + BS Extremities- no clubbing, cyanosis, or edema MS- no significant deformity or atrophy Skin- no rash or lesion Psych- euthymic mood, full affect Neuro- strength and sensation are intact  EKG-   Vent. rate 53 BPM PR interval 154 ms QRS duration 86 ms QT/QTcB 404/379 ms P-R-T axes 51 116 60 Sinus bradycardia with sinus arrhythmia Left posterior fascicular block Abnormal ECG When compared with ECG of 07-Nov-2020 09:44, PREVIOUS ECG IS PRESENT Epic records reviewed   Assessment and Plan: 1. Paroxysmal afib/flutter  Staying in SR Successful ER CV, 10/31/20,  for aflutter at 150 bpm, refractory to meds (prn flecainde and prn cardizem)  He is  s/p 3rd ablation 02/01/20 In SR since CV, trigger may have been family stress Continue diltiazem at 120 mg bid    2.  Chadsvasc score of at least  2  Continue xarelto 20 mg daily   Will request recent labs from pcp for review   3. OSA Continue CPAP  4. HTN Stable at home    F/u with Dr. Marlou Porch as scheduled Afib clinic as needed   Geroge Baseman. Laylonie Marzec, [facility] [address] [place], [place] [address] [phone]

## 2022-08-06 ENCOUNTER — Ambulatory Visit: Payer: Medicare PPO | Admitting: Cardiology

## 2022-08-07 ENCOUNTER — Ambulatory Visit: Payer: Medicare PPO | Attending: Cardiology | Admitting: Cardiology

## 2022-08-07 ENCOUNTER — Encounter: Payer: Self-pay | Admitting: Cardiology

## 2022-08-07 VITALS — BP 130/66 | HR 58 | Ht 71.0 in | Wt 236.0 lb

## 2022-08-07 DIAGNOSIS — I10 Essential (primary) hypertension: Secondary | ICD-10-CM

## 2022-08-07 DIAGNOSIS — R931 Abnormal findings on diagnostic imaging of heart and coronary circulation: Secondary | ICD-10-CM

## 2022-08-07 DIAGNOSIS — I480 Paroxysmal atrial fibrillation: Secondary | ICD-10-CM

## 2022-08-07 NOTE — Patient Instructions (Signed)
Medication Instructions:  The current medical regimen is effective;  continue present plan and medications.  *If you need a refill on your cardiac medications before your next appointment, please call your pharmacy*  Follow-Up: At [facility], you and your health needs are our priority.  As part of our continuing mission to provide you with exceptional heart care, we have created designated Provider Care Teams.  These Care Teams include your primary Cardiologist (physician) and Advanced Practice Providers (APPs -  Physician Assistants and Nurse Practitioners) who all work together to provide you with the care you need, when you need it.  We recommend signing up for the patient portal called "MyChart".  Sign up information is provided on this After Visit Summary.  MyChart is used to connect with patients for Virtual Visits (Telemedicine).  Patients are able to view lab/test results, encounter notes, upcoming appointments, etc.  Non-urgent messages can be sent to your provider as well.   To learn more about what you can do with MyChart, go to [email or web address].    Your next appointment:   6 month(s)  Provider:   Ernest Dick, NP, Michele Lenze, PA-C, Michelle Swinyer, NP, or Scott Weaver, PA-C     Then, Mark Skains, MD will plan to see you again in 1 year(s).     

## 2022-08-07 NOTE — Progress Notes (Unsigned)
Cardiology Office Note:    Date:  08/08/2022   ID:  Carlos Thomas, DOB 02/17/1951, MRN [id number]  PCP:  Shirline Frees, MD  Cardiologist:  Candee Furbish, MD  Electrophysiologist:  None   Referring MD: Shirline Frees, MD     History of Present Illness:    Carlos Thomas is a 72 y.o. male here for the follow-up of prior atrial fibrillation status post 3 separate atrial fibrillation ablations in 2010 and 2013 and 2021 off of flecainide.  He has been compliant with his medications. Taking Xarelto for anticoagulation without any bleeding.  He has had follow-up in the past with the atrial fibrillation clinic.  At one point, he saw a neurologist for neuropathy and the neurologist suggested that the flecanide may be a cause for the neuropathic symptoms. At that point, he decided to continue with the flecanide in order to give himself extra assurance that he would not go into atrial fibrillation again.  Prior EKG showed sinus bradycardia 46 with left anterior fascicular block which was asymptomatic. He has not had any syncopal episodes no fevers chills nausea vomiting syncope bleeding.  Overall has been doing very well without any anginal symptoms.  Enjoys flying.  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  He is thinking about Pat. this.   Please see below for details.  Holter monitor 10/12/2019: Sinus bradycardia, average HR 48 bpm, minimum 39 bpm, maximum 81 bpm Rare SVT (atrial tachycardia), 2 episodes, 14 beats duration. Occasional PAC's (premature atrial contractions) No pauses, no atrial fibrillation.   Transesophageal echocardiogram 09/21/2015:  - Left ventricle: Systolic function was normal. The estimated    ejection fraction was in the range of 55% to 60%. Wall motion was    normal; there were no regional wall motion abnormalities.  - Left atrium: The  atrium was moderately dilated. No evidence of    thrombus in the atrial cavity or appendage.  - Atrial septum: No defect or patent foramen ovale was identified.   Impressions:   - Normal LV systolic function; moderate LAE; no LAA thrombus; trace    MR and TR.   Lower extremity Doppler negative for DVT 07/09/2019    Past Medical History:  Diagnosis Date   Anticoagulant long-term use    xarelto   Balanitis    GERD (gastroesophageal reflux disease)    History of adenomatous polyp of colon    2006  tubular adenoma's and hyperplastic polyp's;   2011  tubular adenoma   History of atrial flutter    s/p ablation atrial flutter/atrial fib in 2010   History of MRSA infection    1990's  infected wound near surgerical area    History of repair of hiatal hernia    HTN (hypertension)    Obesity (BMI 30-39.9)    OSA on CPAP    Mild OSA  per study 05-07-2012-- w A[facility] 6.57/hr now on 13cm H2O   PAF (paroxysmal atrial fibrillation) [facility]) primary cardiologist-  dr Marlou Porch   ep cardiologist-  dr Rayann Heman---  s/p  ablation afib and aflutter 2010  and  ablation afib/svt  2013   Peripheral neuropathy, hereditary/idiopathic    bilateral feet---  neurologist- dr patel   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[place], 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 , fireman   S/P radiofrequency ablation operation for arrhythmia    01-09-2009  cardiac ablation afib/flutter;  09-13-2011  ablation afib/svt and in ther right atrium   Wears glasses     Past Surgical History:  Procedure Laterality Date   ATRIAL FIBRILLATION ABLATION N/A 09/13/2011   Procedure: ATRIAL FIBRILLATION ABLATION;  Surgeon: Thompson Grayer, MD;  Location: [facility] CATH LAB;  Service: Cardiovascular;  Laterality: N/A;   ATRIAL FIBRILLATION ABLATION N/A 02/01/2020   Procedure: ATRIAL FIBRILLATION ABLATION;  Surgeon: Thompson Grayer, MD;  Location: [facility] CV LAB;  Service: Cardiovascular;  Laterality: N/A;   CARDIAC ELECTROPHYSIOLOGY MAPPING AND ABLATION  [id number]   dr allred    successful ablation atrial fibrillation /  atrial flutter   CARDIAC ELECTROPHYSIOLOGY MAPPING AND ABLATION  09-13-2011   dr Rayann Heman   afib mapping/ ablation and  mapping/ablation in the right atrium   CARDIOVASCULAR STRESS TEST  09/2006   per dr Marlou Porch note nuclear study normal w/ no ischemia   CARDIOVERSION N/A 09/21/2015   Procedure: CARDIOVERSION;  Surgeon: Lelon Perla, MD;  Location: [facility] ENDOSCOPY;  Service: Cardiovascular;  Laterality: N/A;   CIRCUMCISION N/A 06/27/2016   Procedure: CIRCUMCISION ADULT;  Surgeon: Carolan Clines, MD;  Location: [facility];  Service: Urology;  Laterality: N/A;   COLONOSCOPY  last one 02/  2011   DIRECT CURRENT CARDIOVERSION  02/02/2010   dr Leonia Reeves   LAPAROSCOPIC CHOLECYSTECTOMY  1990's   OPEN HIATAL HERNIA REPAIR  1970's   TEE WITHOUT CARDIOVERSION  09/12/2011   Procedure: TRANSESOPHAGEAL ECHOCARDIOGRAM (TEE);  Surgeon: Jettie Booze, MD;  Location: [facility] ENDOSCOPY;  Service: Cardiovascular;  Laterality: N/A;  trace AR, no LA/LAA thrombus, normal LVfunction, moderate descending aortic atherosclerosis   TEE WITHOUT CARDIOVERSION N/A 09/21/2015   Procedure: TRANSESOPHAGEAL ECHOCARDIOGRAM (TEE);  Surgeon: Lelon Perla, MD;  Location: [facility] ENDOSCOPY;  Service: Cardiovascular;  Laterality: N/A; normal LV systolic funciton,  moderate LAE,  no LAA thrombus,  trace MR and TR   TRANSTHORACIC ECHOCARDIOGRAM  06/03/2014   ef 55-60%/  trivial MR and TR    Current Medications: Current Meds  Medication Sig   clotrimazole-betamethasone (LOTRISONE) cream Apply 1 Application topically as needed.   Cyanocobalamin (VITAMIN B-12) 2500 MCG SUBL Take 2,500 mcg by mouth daily.   diltiazem (CARDIZEM CD) 120 MG 24 hr capsule Take 1 capsule (120 mg total) by mouth 2 (two) times daily.   diltiazem (CARDIZEM) 30 MG tablet TAKE 1 TABLET EVERY 4 HOURS AS NEEDED A-FIB, HEART RATE OVER 100   omeprazole (PRILOSEC) 20 MG capsule Take 20 mg by mouth daily  before breakfast.    rivaroxaban (XARELTO) 20 MG TABS tablet Take 1 tablet (20 mg total) by mouth daily with supper.   tamsulosin (FLOMAX) 0.4 MG CAPS capsule Take 0.4 mg by mouth every 12 (twelve) hours.      Allergies:   Dextromethorphan   Social History   Socioeconomic History   Marital status: Divorced    Spouse name: Not on file   Number of children: Not on file   Years of education: Not on file   Highest education level: Not on file  Occupational History   Not on file  Tobacco Use   Smoking status: Never   Smokeless tobacco: Never  Vaping Use   Vaping Use: Never used  Substance and Sexual Activity   Alcohol use: No    Alcohol/week: 1.0 standard drink of alcohol    Types: 1 Cans of beer per week    Comment: rare   Drug use: No   Sexual activity: Not on file  Other  Topics Concern   Not on file  Social History Narrative   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  Education: college.   Social Determinants of Health   Financial Resource Strain: Not on file  Food Insecurity: Not on file  Transportation Needs: Not on file  Physical Activity: Not on file  Stress: Not on file  Social Connections: Not on file     Family History: The patient's family history includes CAD in his father; Heart attack in his father. There is no history of Atrial fibrillation or Anesthesia problems.  ROS:   Please see the history of present illness.   No fevers chills nausea vomiting syncope bleeding  All other systems reviewed and are negative.  EKGs/Labs/Other Studies Reviewed:    The following studies were reviewed today: As above  EKG:  prior Sinus brady at 46 ms, pr int 182 ms, qrs int 96 ms, qtc 178 ms   Recent Labs: No results found for requested labs within last 365 days.  Recent Lipid Panel No results found for: "CHOL", "TRIG", "HDL", "CHOLHDL", "VLDL", "LDLCALC", "LDLDIRECT"  Physical Exam:    VS:  BP  130/66   Pulse (!) 58   Ht '5\' 11"'$  (1.803 m)   Wt 236 lb (107 kg)   SpO2 96%   BMI 32.92 kg/m     Wt Readings from Last 3 Encounters:  08/07/22 236 lb (107 kg)  07/17/22 236 lb 12.8 oz (107.4 kg)  06/18/21 238 lb 9.6 oz (108.2 kg)     GEN:  Well nourished, well developed in no acute distress HEENT: Normal NECK: No JVD; No carotid bruits LYMPHATICS: No lymphadenopathy CARDIAC: RRR, no murmurs, rubs, gallops, occasional ectopy in regular pattern RESPIRATORY:  Clear to auscultation without rales, wheezing or rhonchi  ABDOMEN: Soft, non-tender, non-distended MUSCULOSKELETAL:  No edema; No deformity  SKIN: Warm and dry NEUROLOGIC:  Alert and oriented x 3 PSYCHIATRIC:  Normal affect   ASSESSMENT:    1. PAF (paroxysmal atrial fibrillation) (HCC)   2. Elevated coronary artery calcium score   3. Essential hypertension     PLAN:    In order of problems listed above:  Paroxysmal atrial fibrillation -Doing very well off of flecainide.   He has had 3 separate ablations.   There was concern that the flecainide may be contributing to a potential neuropathy.  He thinks that this may not be the case.  He has not had any further progression of the symptoms. '30mg'$  dilt. PRN -No recent episodes.  Chronic anticoagulation -Continue with Xarelto, no bleeding episodes.  CHA2DS2-VASc of 2.  Continue to monitor blood work  Obstructive sleep apnea -Continue with CPAP excellent. Dr. Radford Pax. New mask   Medication Adjustments/Labs and Tests Ordered: Current medicines are reviewed at length with the patient today.  Concerns regarding medicines are outlined above.  No orders of the defined types were placed in this encounter.  No orders of the defined types were placed in this encounter.   Patient Instructions  Medication Instructions:  The current medical regimen is effective;  continue present plan and medications.  *If you need a refill on your cardiac medications before your next  appointment, please call your pharmacy*  Follow-Up: At [facility], you and your health needs are our priority.  As part of our continuing mission to provide you with exceptional heart care, we have created designated Provider Care Teams.  These Care  Teams include your primary Cardiologist (physician) and Advanced Practice Providers (APPs -  Physician Assistants and Nurse Practitioners) who all work together to provide you with the care you need, when you need it.  We recommend signing up for the patient portal called "MyChart".  Sign up information is provided on this After Visit Summary.  MyChart is used to connect with patients for Virtual Visits (Telemedicine).  Patients are able to view lab/test results, encounter notes, upcoming appointments, etc.  Non-urgent messages can be sent to your provider as well.   To learn more about what you can do with MyChart, go to [email or web address].    Your next appointment:   6 month(s)  Provider:   Ambrose Pancoast, NP, Ermalinda Barrios, PA-C, Christen Bame, NP, or Richardson Dopp, PA-C     Then, Candee Furbish, MD will plan to see you again in 1 year(s).       Signed, Candee Furbish, MD  08/08/2022 6:29 AM    [facility]

## 2022-08-08 ENCOUNTER — Encounter (HOSPITAL_COMMUNITY): Payer: Self-pay | Admitting: *Deleted

## 2022-09-09 ENCOUNTER — Emergency Department (HOSPITAL_BASED_OUTPATIENT_CLINIC_OR_DEPARTMENT_OTHER): Payer: Medicare PPO

## 2022-09-09 ENCOUNTER — Encounter (HOSPITAL_BASED_OUTPATIENT_CLINIC_OR_DEPARTMENT_OTHER): Payer: Self-pay

## 2022-09-09 ENCOUNTER — Other Ambulatory Visit: Payer: Self-pay

## 2022-09-09 ENCOUNTER — Emergency Department (HOSPITAL_BASED_OUTPATIENT_CLINIC_OR_DEPARTMENT_OTHER)
Admission: EM | Admit: 2022-09-09 | Discharge: 2022-09-09 | Disposition: A | Discharge status: Home / Self Care | Payer: Medicare PPO | Attending: Emergency Medicine | Admitting: Emergency Medicine

## 2022-09-09 DIAGNOSIS — I4891 Unspecified atrial fibrillation: Secondary | ICD-10-CM

## 2022-09-09 DIAGNOSIS — R002 Palpitations: Secondary | ICD-10-CM | POA: Diagnosis not present

## 2022-09-09 DIAGNOSIS — R0789 Other chest pain: Secondary | ICD-10-CM | POA: Diagnosis not present

## 2022-09-09 DIAGNOSIS — R000 Tachycardia, unspecified: Secondary | ICD-10-CM | POA: Diagnosis not present

## 2022-09-09 DIAGNOSIS — R079 Chest pain, unspecified: Secondary | ICD-10-CM

## 2022-09-09 LAB — BASIC METABOLIC PANEL
Anion gap: 9 (ref 5–15)
BUN: 20 mg/dL (ref 8–23)
CO2: 25 mmol/L (ref 22–32)
Calcium: 8.5 mg/dL — ABNORMAL LOW (ref 8.9–10.3)
Chloride: 97 mmol/L — ABNORMAL LOW (ref 98–111)
Creatinine, Ser: 1.07 mg/dL (ref 0.61–1.24)
GFR, Estimated: >60 mL/min (ref >60)
Glucose, Bld: 152 mg/dL — ABNORMAL HIGH (ref 70–99)
Potassium: 3.8 mmol/L (ref 3.5–5.1)
Sodium: 131 mmol/L — ABNORMAL LOW (ref 135–145)

## 2022-09-09 LAB — PROTIME-INR
INR: 1.9 — ABNORMAL HIGH (ref 0.8–1.2)
Prothrombin Time: 21.9 s — ABNORMAL HIGH (ref 11.4–15.2)

## 2022-09-09 LAB — CBC
HCT: 49.4 % (ref 39.0–52.0)
Hemoglobin: 16.9 g/dL (ref 13.0–17.0)
MCH: 32.5 pg (ref 26.0–34.0)
MCHC: 34.2 g/dL (ref 30.0–36.0)
MCV: 95 fL (ref 80.0–100.0)
Platelets: 148 10*3/uL — ABNORMAL LOW (ref 150–400)
RBC: 5.2 MIL/uL (ref 4.22–5.81)
RDW: 13.1 % (ref 11.5–15.5)
WBC: 6.5 10*3/uL (ref 4.0–10.5)
nRBC: 0 % (ref 0.0–0.2)

## 2022-09-09 LAB — TSH: TSH: 1.148 u[IU]/mL (ref 0.350–4.500)

## 2022-09-09 MED ORDER — ETOMIDATE 2 MG/ML IV SOLN
20.0000 mg | Freq: Once | INTRAVENOUS | Status: AC
  Administered 2022-09-09: 20 mg via INTRAVENOUS
  Filled 2022-09-09: qty 10

## 2022-09-09 MED ORDER — SODIUM CHLORIDE 0.9 % IV BOLUS
1000.0000 mL | Freq: Once | INTRAVENOUS | Status: AC
  Administered 2022-09-09: 1000 mL via INTRAVENOUS

## 2022-09-09 NOTE — Sedation Documentation (Signed)
Pt. Is eating crackers and drinking ginger ale.

## 2022-09-09 NOTE — Sedation Documentation (Signed)
Pt.   Is resting with eyes closed and rated is 61 at NSR and sats are 95%  with EDP at bedside.  Pt. B/P is 120/85

## 2022-09-09 NOTE — ED Triage Notes (Signed)
Pt reports he has been "tachy" since 9pm last night. Mild chest pain. Hx of A Fib. Took 30 mg diltazem 8pm, 1230 am, 430 am and 830 am, then took 120 mg this am. Pt taking zarelto

## 2022-09-09 NOTE — Discharge Instructions (Signed)
Your history and exam today are consistent with recurrent atrial fibrillation with RVR which was the fast arrhythmia you had overnight.  Your labs returned overall reassuring however the TSH is still in process.  Please follow-up with her cardiologist for that result.  I spoke to cardiology who felt that ED cardioversion was appropriate and you tolerated it very well.  As you proved stability without going back into arrhythmia and had resolution of symptoms, we feel you are safe for discharge home.  Please rest and stay hydrated and follow-up with your cardiologist and primary doctor.  If any symptoms change or worsen acutely, please return to the nearest emergency department.

## 2022-09-09 NOTE — Sedation Documentation (Signed)
Pt. Is having cardioversion done due to   Syncronized at present time shocked now   is in NSR at present time 1334 pm

## 2022-09-09 NOTE — Sedation Documentation (Signed)
Pt. Still resting with eyes closed after sync. Cardioversion.  Pt. Is opening and closing his mouth at present time.  Pt. HR is 59 and 61 with sats. 95%  Pt. Still resting and opens eyes off and on.

## 2022-09-09 NOTE — ED Provider Notes (Signed)
[place] [facility] Provider Note   CSN: [id number] Arrival date & time: 09/09/22  1017     History  Chief Complaint  Patient presents with   Tachycardia    Carlos Thomas is a 72 y.o. male.  The history is provided by the patient and medical records. No language interpreter was used.  Palpitations Palpitations quality:  Fast Onset quality:  Sudden Duration:  1 day Timing:  Constant Progression:  Unchanged Chronicity:  Recurrent Relieved by:  Nothing Worsened by:  Nothing Ineffective treatments: diltiazem. Associated symptoms: chest pain and chest pressure   Associated symptoms: no back pain, no cough, no diaphoresis, no dizziness, no leg pain, no malaise/fatigue, no nausea, no near-syncope, no numbness, no shortness of breath, no vomiting and no weakness   Risk factors: hx of atrial fibrillation   Risk factors: no hx of PE        Home Medications Prior to Admission medications   Medication Sig Start Date End Date Taking? Authorizing Provider  diltiazem (CARDIZEM CD) 120 MG 24 hr capsule Take 1 capsule (120 mg total) by mouth 2 (two) times daily. 07/17/22  Yes Sherran Needs, NP  diltiazem (CARDIZEM) 30 MG tablet TAKE 1 TABLET EVERY 4 HOURS AS NEEDED A-FIB, HEART RATE OVER 100 07/17/22  Yes Sherran Needs, NP  rivaroxaban (XARELTO) 20 MG TABS tablet Take 1 tablet (20 mg total) by mouth daily with supper. 07/17/22  Yes Sherran Needs, NP  clotrimazole-betamethasone (LOTRISONE) cream Apply 1 Application topically as needed.    [provider]  Cyanocobalamin (VITAMIN B-12) 2500 MCG SUBL Take 2,500 mcg by mouth daily.    [provider]  omeprazole (PRILOSEC) 20 MG capsule Take 20 mg by mouth daily before breakfast.     [provider]  tamsulosin (FLOMAX) 0.4 MG CAPS capsule Take 0.4 mg by mouth every 12 (twelve) hours.     [provider]  flecainide (TAMBOCOR) 50 MG tablet Take 1 tablet (50 mg  total) by mouth 2 (two) times daily. 10/31/20 10/31/20  Gareth Morgan, MD      Allergies    Dextromethorphan    Review of Systems   Review of Systems  Constitutional:  Negative for chills, diaphoresis, fatigue, fever and malaise/fatigue.  HENT:  Negative for congestion.   Respiratory:  Negative for cough, chest tightness, shortness of breath, wheezing and stridor.   Cardiovascular:  Positive for chest pain and palpitations. Negative for near-syncope.  Gastrointestinal:  Negative for abdominal pain, nausea and vomiting.  Genitourinary:  Negative for dysuria and flank pain.  Musculoskeletal:  Negative for back pain.  Neurological:  Negative for dizziness, weakness, light-headedness, numbness and headaches.  Psychiatric/Behavioral:  Negative for agitation and confusion.   All other systems reviewed and are negative.   Physical Exam Updated Vital Signs BP 116/88   Pulse (!) 151   Temp 97.6 F (36.4 C) (Oral)   Resp 17   Ht '5\' 11"'$  (1.803 m)   Wt 104.3 kg   SpO2 96%   BMI 32.08 kg/m  Physical Exam Vitals and nursing note reviewed.  Constitutional:      General: He is not in acute distress.    Appearance: He is well-developed. He is not ill-appearing or diaphoretic.  HENT:     Head: Normocephalic and atraumatic.     Nose: Nose normal.  Eyes:     Conjunctiva/sclera: Conjunctivae normal.  Cardiovascular:     Rate and Rhythm: Regular rhythm. Tachycardia  present.     Pulses: Normal pulses.     Heart sounds: No murmur heard. Pulmonary:     Effort: Pulmonary effort is normal. No respiratory distress.     Breath sounds: Normal breath sounds. No wheezing, rhonchi or rales.  Chest:     Chest wall: No tenderness.  Abdominal:     General: Abdomen is flat.     Palpations: Abdomen is soft.     Tenderness: There is no abdominal tenderness.  Musculoskeletal:        General: No swelling or tenderness.     Cervical back: Neck supple.     Right lower leg: No edema.     Left lower  leg: No edema.  Skin:    General: Skin is warm and dry.     Capillary Refill: Capillary refill takes less than 2 seconds.  Neurological:     General: No focal deficit present.     Mental Status: He is alert.  Psychiatric:        Mood and Affect: Mood normal.     ED Results / Procedures / Treatments   Labs (all labs ordered are listed, but only abnormal results are displayed) Labs Reviewed  BASIC METABOLIC PANEL - Abnormal; Notable for the following components:      Result Value   Sodium 131 (*)    Chloride 97 (*)    Glucose, Bld 152 (*)    Calcium 8.5 (*)    All other components within normal limits  CBC - Abnormal; Notable for the following components:   Platelets 148 (*)    All other components within normal limits  PROTIME-INR - Abnormal; Notable for the following components:   Prothrombin Time 21.9 (*)    INR 1.9 (*)    All other components within normal limits  MAGNESIUM  TSH    EKG EKG Interpretation  Date/Time:  Monday September 09 2022 10:24:24 EDT Ventricular Rate:  150 PR Interval:  70 QRS Duration: 91 QT Interval:  282 QTC Calculation: 446 R Axis:   150 Text Interpretation: Supraventricular tachycardia Left posterior fascicular block RSR' in V1 or V2, probably normal variant Borderline T abnormalities, inferior leads when compared to prior, now afib RVR vs SVT. No sTEMI Confirmed by Antony Blackbird [phone]) on 09/09/2022 10:30:05 AM  Radiology DG Chest 2 View  Result Date: 09/09/2022 CLINICAL DATA:  Tachycardia. EXAM: CHEST - 2 VIEW COMPARISON:  09/25/2019 FINDINGS: Normal heart size and mediastinal contours. No pleural fluid or airspace disease. No interstitial edema. Visualized osseous structures appear unremarkable. IMPRESSION: No active cardiopulmonary disease. Electronically Signed   By: Kerby Moors M.D.   On: 09/09/2022 11:11    Procedures .Cardioversion  Date/Time: 09/09/2022 1:44 PM  Performed by: Courtney Paris, MD Authorized by:  Courtney Paris, MD   Consent:    Consent obtained:  Written   Consent given by:  Patient   Risks discussed:  Cutaneous burn, death, induced arrhythmia and pain   Alternatives discussed:  No treatment Pre-procedure details:    Cardioversion basis:  Emergent   Rhythm:  Atrial flutter   Electrode placement:  Anterior-posterior Patient sedated: Yes. Refer to sedation procedure documentation for details of sedation.  Attempt one:    Cardioversion mode:  Synchronous   Waveform:  Biphasic   Shock (Joules):  120   Shock outcome:  Conversion to normal sinus rhythm .Sedation  Date/Time: 09/09/2022 1:44 PM  Performed by: Courtney Paris, MD Authorized by: Courtney Paris, MD  Consent:    Consent obtained:  Written   Consent given by:  Patient   Risks discussed:  Allergic reaction, nausea, vomiting and inadequate sedation Universal protocol:    Immediately prior to procedure, a time out was called: yes     Patient identity confirmed:  Verbally with patient and provided demographic data Indications:    Procedure performed:  Cardioversion Pre-sedation assessment:    Time since last food or drink:  Hours   NPO status caution: unable to specify NPO status     ASA classification: class 2 - patient with mild systemic disease     Mallampati score:  I - soft palate, uvula, fauces, pillars visible   Pre-sedation assessments completed and reviewed: pre-procedure airway patency not reviewed, pre-procedure hydration status not reviewed, pre-procedure mental status not reviewed, pre-procedure nausea and vomiting status not reviewed, pre-procedure pain level not reviewed and pre-procedure respiratory function not reviewed   Immediate pre-procedure details:    Reviewed: vital signs     Verified: bag valve mask available, emergency equipment available, intubation equipment available, IV patency confirmed and oxygen available   Procedure details (see MAR for exact dosages):     Preoxygenation:  Nasal cannula   Sedation:  Etomidate   Intended level of sedation: deep   Intra-procedure monitoring:  Blood pressure monitoring   Intra-procedure events: none     Total Provider sedation time (minutes):  20 Post-procedure details:    Attendance: Constant attendance by certified staff until patient recovered     Recovery: Patient returned to pre-procedure baseline     Patient is stable for discharge or admission: yes     Procedure completion:  Tolerated well, no immediate complications     CRITICAL CARE Performed by: Gwenyth Allegra Lucie Friedlander Total critical care time: 30 minutes Critical care time was exclusive of separately billable procedures and treating other patients. Critical care was necessary to treat or prevent imminent or life-threatening deterioration. Critical care was time spent personally by me on the following activities: development of treatment plan with patient and/or surrogate as well as nursing, discussions with consultants, evaluation of patient's response to treatment, examination of patient, obtaining history from patient or surrogate, ordering and performing treatments and interventions, ordering and review of laboratory studies, ordering and review of radiographic studies, pulse oximetry and re-evaluation of patient's condition.   Medications Ordered in ED Medications  sodium chloride 0.9 % bolus 1,000 mL (0 mLs Intravenous Stopped 09/09/22 1219)  sodium chloride 0.9 % bolus 1,000 mL (1,000 mLs Intravenous New Bag/Given 09/09/22 1326)  etomidate (AMIDATE) injection 20 mg (20 mg Intravenous Given 09/09/22 1332)    ED Course/ Medical Decision Making/ A&P                             Medical Decision Making Amount and/or Complexity of Data Reviewed Labs: ordered. Radiology: ordered.  Risk Prescription drug management.    Carlos Thomas is a 72 y.o. male with a past medical history significant for atrial fibrillation, atrial flutter status  post multiple ablations on Xarelto therapy, hypertension, obesity, and previous cholecystectomy who presents with palpitations and some chest tightness.  According patient, around 8 PM last night patient felt palpitations and fast heart rate.  He also had some mild chest that similar to when he has had RVR in the past.  He reports taking multiple doses of his oral diltiazem that usually will convert it but it did not do so.  Patient presents for evaluation.  He otherwise denies fevers, chills, congestion, cough, nausea, vomiting, constipation, diarrhea, or urinary changes.  Denies any sick contacts.  Denies any viral infectious symptoms Thomas as congestion, cough, or other symptoms.  Denies sick contacts.  Denies any trauma.  Denies lightheadedness or syncope.  Patient is feeling well aside from the fast heart rate palpitations and the mild chest tightness.  On exam, lungs clear.  Chest nontender.  Abdomen nontender.  Good pulses in extremities.  Patient resting comfortably but still having the symptoms.  His heart rate is right around 150 and appears to show SVT versus A-fib with RVR.  No STEMI.  A vagal maneuver was attempted and his heart rate did slow down into the 140s and revealed a sawtooth pattern so I do suspect this is indeed flutter with RVR.  We agreed to give him some fluids, get some labs, and then likely touch base with cardiology to determine if he is appropriate for ED cardioversion versus needing admission for further management.  Patient reports he is initially hesitant for ED cardioversion because he reports he was minimally sedated last time and he remember and felt the discomfort of the shock and wants to avoid that.  12:04 PM Patient's labs began to return.  CBC reassuring other than slightly decreased platelets.  Metabolic panel showed slight low sodium and chloride but otherwise normal kidney function.  Magnesium and TSH still in process.  Chest x-ray shows no acute  abnormality.  I called and spoke with cardiology who feels he does need to be converted.  They requested we do ED cardioversion but the patient did not want to do this and preferred a larger sedation, they could admit for reassessment and further management by cardiology.  Will offer ED cardioversion attempt to the patient.  12:57 PM Patient does want to try ED cardioversion with adequate sedation.  Will give 0.2 mg/kg of etomidate which she appears to tolerate in the past.  Will monitor him closely and perform cardioversion.  If he continues to feel well, anticipate discharge after.  1:44 PM Cardioverted without difficulty back to sinus rhythm.  Will monitor him and make sure he passes p.o. challenge and will discharge home per cardiology recommendations.  Patient otherwise well-appearing.  Patient passed p.o. challenge and is feeling better.  He prove stability without return to a flutter.  Patient will be discharged for outpatient follow-up.  TSH still in process and he will follow-up with [place] with PCP and cardiologist.  Patient with questions or concerns and was discharged in good condition with resolution of RVR.         Final Clinical Impression(s) / ED Diagnoses Final diagnoses:  Atrial fibrillation with RVR (HCC)  Palpitations  Chest pain, unspecified type    Rx / DC Orders ED Discharge Orders     None       Clinical Impression: 1. Atrial fibrillation with RVR (HCC)   2. Palpitations   3. Chest pain, unspecified type     Disposition: Discharge  Condition: Good  I have discussed the results, Dx and Tx plan with the pt(& family if present). He/she/they expressed understanding and agree(s) with the plan. Discharge instructions discussed at great length. Strict return precautions discussed and pt &/or family have verbalized understanding of the instructions. No further questions at time of discharge.    New Prescriptions   No medications on file    Follow  Up: Shirline Frees, MD [address]  [place] [address] [phone]     your cardiologist     [facility] Emergency Department at [facility] [address] [id number] [place] [phone]       Shiniqua Groseclose, Gwenyth Allegra, MD 09/09/22 1452

## 2022-09-09 NOTE — Sedation Documentation (Signed)
Pt. Is awake and talking with no distress noted.

## 2022-09-17 ENCOUNTER — Ambulatory Visit: Payer: Medicare PPO | Admitting: Nurse Practitioner

## 2022-10-16 DIAGNOSIS — R739 Hyperglycemia, unspecified: Secondary | ICD-10-CM | POA: Diagnosis not present

## 2022-10-28 NOTE — Progress Notes (Unsigned)
Cardiology Office Note:    Date:  10/29/2022  ID:  Carlos Thomas, DOB 12/26/1950, MRN [id number] PCP: Johny Blamer, MD  [facility] Providers Cardiologist:  Donato Schultz, MD Sleep Medicine:  Armanda Magic, MD          Patient Profile:   Nonobstructive coronary artery disease Pre ablation CCTA 01/27/20: pLAD < 50, oOM1 < 50; CAC score 36.5 (30th percentile) Myoview 02/28/20: diaph atten, no ischemia, EF 59, low risk Paroxysmal atrial fibrillation S/p PVI ablation x 3 (2010, 2013, 2021) TTE 12/08/19: EF 55-60, no RWMA, NL RVSF, trivial MR, mild dilation at SoV (38 mm), RAP 3 Monitor 05/2021: Rare PACs, PVCs, short nonsustained ATach; no AFib Atrial flutter s/p RF ablation in 2010 Hypertension  OSA Neuropathy   Flecainide DC'd      History of Present Illness:   Carlos Thomas is a 72 y.o. male who returns for post hospital f/u. He was last seen by Dr. Anne Fu 08/07/22. He went to the ED 09/09/22 w Supraventricular Tachycardia/probable atypical atrial flutter, HR 150. He underwent synchronized DCCV. ED notes, labs, EKGs were personally reviewed. Post DCCV EKG showed sinus bradycardia, HR 58. His Na was low at 131, K+ was normal at 3.8, Creatinine was normal at 1.02, Hgb was normal at 16.9, Plt count was mildly low at 148K, TSH was normal at 1.148 and CXR showed no acute disease.  He is here alone.  Since discharge from the emergency room, he has not had any further palpitations.  He does have an occasional left-sided "twinge."  This is described as sharp or dull.  It only occurs at rest.  It is mild.  He has not had exertional symptoms or associated shortness of breath, radiating symptoms.  He has not had orthopnea, syncope.  He does have some mild pedal edema related to neuropathy.  Review of Systems  Gastrointestinal:  Negative for hematochezia and melena.  Genitourinary:  Negative for hematuria.   see HPI    Studies Reviewed:    EKG: Not done  Risk Assessment/Calculations:     CHA2DS2-VASc Score = 3   This indicates a 3.2% annual risk of stroke. The patient's score is based upon: CHF History: 0 HTN History: 1 Diabetes History: 0 Stroke History: 0 Vascular Disease History: 1 Age Score: 1 Gender Score: 0    HYPERTENSION CONTROL Vitals:   10/29/22 1115 10/29/22 1318  BP: (!) 140/64 (!) 140/70    The patient's blood pressure is elevated above target today.  In order to address the patient's elevated BP: Blood pressure will be monitored at home to determine if medication changes need to be made.          Physical Exam:   VS:  BP (!) 140/70   Pulse (!) 54   Ht 5\' 11"  (1.803 m)   Wt 235 lb 9.6 oz (106.9 kg)   SpO2 97%   BMI 32.86 kg/m    Wt Readings from Last 3 Encounters:  10/29/22 235 lb 9.6 oz (106.9 kg)  09/09/22 230 lb (104.3 kg)  08/07/22 236 lb (107 kg)    Constitutional:      Appearance: Healthy appearance. Not in distress.  Neck:     Vascular: JVD normal.  Pulmonary:     Breath sounds: Normal breath sounds. No wheezing. No rales.  Cardiovascular:     Normal rate. Regular rhythm. Normal S1. Normal S2.      Murmurs: There is no murmur.  Edema:    Peripheral  edema present.    Ankle: bilateral trace edema of the ankle. Abdominal:     Palpations: Abdomen is soft.       ASSESSMENT AND PLAN:   Atrial flutter S/p recent DCCV in the ED. Maintaining NSR by exam. He has not had recurrent symptoms of AFib/Flutter.  This was his first episode of atrial fibrillation/flutter since 1 year ago.  Given his history of atrial fibrillation/atrial flutter status post multiple ablation procedures, I recommended that we reestablish him with the EP.  He was previously managed by Dr. Johney Frame.  Continue diltiazem 120 mg twice daily along with diltiazem 30 mg as needed, Xarelto 20 mg daily.  Elevated coronary artery calcium score Calcium score 36 by CT in 2021.  Myoview in 2021 was low risk.  He had mild nonobstructive plaque noted.  He has occasional  left-sided chest discomfort that sounds noncardiac.  We discussed proceeding with stress testing versus watchful waiting.  He prefers watchful waiting.  He knows to contact us if his symptoms should change or worsen.  Essential hypertension Blood pressure borderline.  Continue diltiazem CD 120 mg twice daily.  Monitor blood pressure over the next 2 weeks and send readings for review.  Consider addition of ACE/ARB if blood pressure above target.  Thrombocytopenia (HCC) Mildly low Plt count on labs in the ED. Repeat CBC today.  Hyponatremia Na 131 on labs in the ED. Repeat BMET today.        Dispo:  Return in about 6 months (around 04/30/2023) for Routine Follow Up with Dr. Anne Fu.  Signed, Tereso Newcomer, PA-C

## 2022-10-29 ENCOUNTER — Ambulatory Visit: Payer: Medicare PPO | Attending: Nurse Practitioner | Admitting: Physician Assistant

## 2022-10-29 ENCOUNTER — Encounter: Payer: Self-pay | Admitting: Physician Assistant

## 2022-10-29 VITALS — BP 140/70 | HR 54 | Ht 71.0 in | Wt 235.6 lb

## 2022-10-29 DIAGNOSIS — E871 Hypo-osmolality and hyponatremia: Secondary | ICD-10-CM | POA: Insufficient documentation

## 2022-10-29 DIAGNOSIS — D696 Thrombocytopenia, unspecified: Secondary | ICD-10-CM | POA: Diagnosis not present

## 2022-10-29 DIAGNOSIS — I10 Essential (primary) hypertension: Secondary | ICD-10-CM

## 2022-10-29 DIAGNOSIS — R931 Abnormal findings on diagnostic imaging of heart and coronary circulation: Secondary | ICD-10-CM

## 2022-10-29 DIAGNOSIS — I480 Paroxysmal atrial fibrillation: Secondary | ICD-10-CM

## 2022-10-29 DIAGNOSIS — I484 Atypical atrial flutter: Secondary | ICD-10-CM

## 2022-10-29 NOTE — Assessment & Plan Note (Signed)
Na 131 on labs in the ED. Repeat BMET today.

## 2022-10-29 NOTE — Assessment & Plan Note (Signed)
Blood pressure borderline.  Continue diltiazem CD 120 mg twice daily.  Monitor blood pressure over the next 2 weeks and send readings for review.  Consider addition of ACE/ARB if blood pressure above target.

## 2022-10-29 NOTE — Assessment & Plan Note (Signed)
S/p recent DCCV in the ED. Maintaining NSR by exam. He has not had recurrent symptoms of AFib/Flutter.  This was his first episode of atrial fibrillation/flutter since 1 year ago.  Given his history of atrial fibrillation/atrial flutter status post multiple ablation procedures, I recommended that we reestablish him with the EP.  He was previously managed by Dr. Johney Frame.  Continue diltiazem 120 mg twice daily along with diltiazem 30 mg as needed, Xarelto 20 mg daily.

## 2022-10-29 NOTE — Assessment & Plan Note (Signed)
Mildly low Plt count on labs in the ED. Repeat CBC today.

## 2022-10-29 NOTE — Assessment & Plan Note (Signed)
Calcium score 36 by CT in 2021.  Myoview in 2021 was low risk.  He had mild nonobstructive plaque noted.  He has occasional left-sided chest discomfort that sounds noncardiac.  We discussed proceeding with stress testing versus watchful waiting.  He prefers watchful waiting.  He knows to contact us if his symptoms should change or worsen.

## 2022-10-29 NOTE — Patient Instructions (Signed)
Medication Instructions:  Your physician recommends that you continue on your current medications as directed. Please refer to the Current Medication list given to you today.  *If you need a refill on your cardiac medications before your next appointment, please call your pharmacy*   Lab Work: TODAY:  BMET & CBC  If you have labs (blood work) drawn today and your tests are completely normal, you will receive your results only by: MyChart Message (if you have MyChart) OR A paper copy in the mail If you have any lab test that is abnormal or we need to change your treatment, we will call you to review the results.   Testing/Procedures: None ordered   You have been referred to Electrophysiologist.     Follow-Up: At [facility], you and your health needs are our priority.  As part of our continuing mission to provide you with exceptional heart care, we have created designated Provider Care Teams.  These Care Teams include your primary Cardiologist (physician) and Advanced Practice Providers (APPs -  Physician Assistants and Nurse Practitioners) who all work together to provide you with the care you need, when you need it.  We recommend signing up for the patient portal called "MyChart".  Sign up information is provided on this After Visit Summary.  MyChart is used to connect with patients for Virtual Visits (Telemedicine).  Patients are able to view lab/test results, encounter notes, upcoming appointments, etc.  Non-urgent messages can be sent to your provider as well.   To learn more about what you can do with MyChart, go to [email or web address].    Your next appointment:   6 month(s)  Provider:   Donato Schultz, MD     Other Instructions Your physician has requested that you regularly monitor and record your blood pressure readings at home. Please use the same machine at the same time of day to check your readings and record them to bring to your follow-up visit.   Please  monitor blood pressures and keep a log of your readings for 2 weeks then send a mychart message.    Make sure to check 2 hours after your medications.    AVOID these things for 30 minutes before checking your blood pressure: No Drinking caffeine. No Drinking alcohol. No Eating. No Smoking. No Exercising.   Five minutes before checking your blood pressure: Pee. Sit in a dining chair. Avoid sitting in a soft couch or armchair. Be quiet. Do not talk

## 2022-10-30 LAB — BASIC METABOLIC PANEL
BUN/Creatinine Ratio: 13 (ref 10–24)
BUN: 12 mg/dL (ref 8–27)
CO2: 22 mmol/L (ref 20–29)
Calcium: 9.1 mg/dL (ref 8.6–10.2)
Chloride: 105 mmol/L (ref 96–106)
Creatinine, Ser: 0.9 mg/dL (ref 0.76–1.27)
Glucose: 93 mg/dL (ref 70–99)
Potassium: 4.3 mmol/L (ref 3.5–5.2)
Sodium: 141 mmol/L (ref 134–144)
eGFR: 90 mL/min/{1.73_m2} (ref >59)

## 2022-10-30 LAB — CBC
Hematocrit: 44.5 % (ref 37.5–51.0)
Hemoglobin: 15.5 g/dL (ref 13.0–17.7)
MCH: 32.2 pg (ref 26.6–33.0)
MCHC: 34.8 g/dL (ref 31.5–35.7)
MCV: 92 fL (ref 79–97)
Platelets: 134 10*3/uL — ABNORMAL LOW (ref 150–450)
RBC: 4.82 x10E6/uL (ref 4.14–5.80)
RDW: 12.1 % (ref 11.6–15.4)
WBC: 4.5 10*3/uL (ref 3.4–10.8)

## 2022-11-01 ENCOUNTER — Telehealth: Payer: Self-pay

## 2022-11-01 NOTE — Telephone Encounter (Signed)
-----   Message from Beatrice Lecher, [place] sent at 11/01/2022 12:41 PM EDT ----- Hgb normal. Plt count mildly low but stable going back to 2020. Na, Creatinine, K+ normal.  PLAN:  - Continue current medications/treatment plan and follow up as scheduled.  Tereso Newcomer, PA-C    11/01/2022 12:40 PM

## 2022-11-01 NOTE — Telephone Encounter (Signed)
Patient is aware of lab results and provider recommendations. Verbalized understanding. 

## 2022-11-14 DIAGNOSIS — C44519 Basal cell carcinoma of skin of other part of trunk: Secondary | ICD-10-CM | POA: Diagnosis not present

## 2022-11-14 DIAGNOSIS — L820 Inflamed seborrheic keratosis: Secondary | ICD-10-CM | POA: Diagnosis not present

## 2022-11-14 DIAGNOSIS — D225 Melanocytic nevi of trunk: Secondary | ICD-10-CM | POA: Diagnosis not present

## 2022-11-18 ENCOUNTER — Ambulatory Visit: Payer: Medicare PPO | Attending: Cardiovascular Disease | Admitting: Cardiovascular Disease

## 2022-11-18 ENCOUNTER — Encounter: Payer: Self-pay | Admitting: Cardiovascular Disease

## 2022-11-18 VITALS — BP 148/70 | HR 57 | Ht 71.0 in | Wt 237.8 lb

## 2022-11-18 DIAGNOSIS — I480 Paroxysmal atrial fibrillation: Secondary | ICD-10-CM

## 2022-11-18 NOTE — Progress Notes (Signed)
Electrophysiology Office Note:    Date:  11/18/2022   ID:  Carlos Thomas, DOB 11-07-1950, MRN [id number]  PCP:  Johny Blamer, MD   [facility] Providers Cardiologist:  Donato Schultz, MD Sleep Medicine:  Armanda Magic, MD     Referring MD: Levi Aland, NP   History of Present Illness:    Carlos Thomas is a 72 y.o. male with a hx listed below, significant for atrial fibrillation and flutter status post ablations in 2010, 2013, 2021 who presents for follow-up.  He is a former patient of Dr. Johney Frame.  At the time of the last ablation, in 2021, all 4 pulmonary veins remained quiescent.  A posterior box lesion set was created.  He has been doing well. He has required 2 cardioversions since the last ablation.  The most recent cardioversion was in March.  Past Medical History:  Diagnosis Date   Anticoagulant long-term use    xarelto   Balanitis    GERD (gastroesophageal reflux disease)    History of adenomatous polyp of colon    2006  tubular adenoma's and hyperplastic polyp's;   2011  tubular adenoma   History of atrial flutter    s/p ablation atrial flutter/atrial fib in 2010   History of MRSA infection    1990's  infected wound near surgerical area    History of repair of hiatal hernia    HTN (hypertension)    Obesity (BMI 30-39.9)    OSA on CPAP    Mild OSA  per study 05-07-2012-- w AHI 6.57/hr now on 13cm H2O   PAF (paroxysmal atrial fibrillation) Carlos Thomas) primary cardiologist-  dr Anne Fu   ep cardiologist-  dr Johney Frame---  s/p  ablation afib and aflutter 2010  and  ablation afib/svt  2013   Peripheral neuropathy, hereditary/idiopathic    bilateral feet---  neurologist- dr patel   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[place], 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 , fireman   S/P radiofrequency ablation operation for arrhythmia    01-09-2009  cardiac ablation afib/flutter;   09-13-2011  ablation afib/svt and in ther right atrium   Wears glasses     Past Surgical History:  Procedure  Laterality Date   ATRIAL FIBRILLATION ABLATION N/A 09/13/2011   Procedure: ATRIAL FIBRILLATION ABLATION;  Surgeon: Hillis Range, MD;  Location: [facility] CATH LAB;  Service: Cardiovascular;  Laterality: N/A;   ATRIAL FIBRILLATION ABLATION N/A 02/01/2020   Procedure: ATRIAL FIBRILLATION ABLATION;  Surgeon: Hillis Range, MD;  Location: [facility] INVASIVE CV LAB;  Service: Cardiovascular;  Laterality: N/A;   CARDIAC ELECTROPHYSIOLOGY MAPPING AND ABLATION  [phone]   dr allred   successful ablation atrial fibrillation /  atrial flutter   CARDIAC ELECTROPHYSIOLOGY MAPPING AND ABLATION  09-13-2011   dr Johney Frame   afib mapping/ ablation and  mapping/ablation in the right atrium   CARDIOVASCULAR STRESS TEST  09/2006   per dr Anne Fu note nuclear study normal w/ no ischemia   CARDIOVERSION N/A 09/21/2015   Procedure: CARDIOVERSION;  Surgeon: Lewayne Bunting, MD;  Location: [facility] ENDOSCOPY;  Service: Cardiovascular;  Laterality: N/A;   CIRCUMCISION N/A 06/27/2016   Procedure: CIRCUMCISION ADULT;  Surgeon: Jethro Bolus, MD;  Location: [facility];  Service: Urology;  Laterality: N/A;   COLONOSCOPY  last one 02/  2011   DIRECT CURRENT CARDIOVERSION  02/02/2010   dr [name]   LAPAROSCOPIC CHOLECYSTECTOMY  1990's   OPEN HIATAL HERNIA REPAIR  1970's   TEE WITHOUT CARDIOVERSION  09/12/2011   Procedure: TRANSESOPHAGEAL  ECHOCARDIOGRAM (TEE);  Surgeon: Corky Crafts, MD;  Location: [facility] ENDOSCOPY;  Service: Cardiovascular;  Laterality: N/A;  trace AR, no LA/LAA thrombus, normal LVfunction, moderate descending aortic atherosclerosis   TEE WITHOUT CARDIOVERSION N/A 09/21/2015   Procedure: TRANSESOPHAGEAL ECHOCARDIOGRAM (TEE);  Surgeon: Lewayne Bunting, MD;  Location: [facility] ENDOSCOPY;  Service: Cardiovascular;  Laterality: N/A; normal LV systolic funciton,  moderate LAE,  no LAA thrombus,  trace MR and TR   TRANSTHORACIC ECHOCARDIOGRAM  06/03/2014   ef 55-60%/  trivial MR and TR    Current  Medications: Current Meds  Medication Sig   clotrimazole-betamethasone (LOTRISONE) cream Apply 1 Application topically as needed.   Cyanocobalamin (VITAMIN B-12) 2500 MCG SUBL Take 2,500 mcg by mouth daily.   diltiazem (CARDIZEM CD) 120 MG 24 hr capsule Take 1 capsule (120 mg total) by mouth 2 (two) times daily.   diltiazem (CARDIZEM) 30 MG tablet TAKE 1 TABLET EVERY 4 HOURS AS NEEDED A-FIB, HEART RATE OVER 100   omeprazole (PRILOSEC) 20 MG capsule Take 20 mg by mouth daily before breakfast.    rivaroxaban (XARELTO) 20 MG TABS tablet Take 1 tablet (20 mg total) by mouth daily with supper.   tamsulosin (FLOMAX) 0.4 MG CAPS capsule Take 0.4 mg by mouth every 12 (twelve) hours.      Allergies:   Dextromethorphan   Social and Family History: Reviewed in Epic  ROS:   Please see the history of present illness.    All other systems reviewed and are negative.  EKGs/Labs/Other Studies Reviewed Today:    Echocardiogram:  12/08/2019 EF 55-60%   Monitors:  06/26/2021 Sinus rhythm, rare ectopy. No AF  Stress testing:   Advanced imaging:   Cardiac catherization   EKG:  Last EKG results: sinus bradycardia   Recent Labs: 09/09/2022: TSH 1.148 10/29/2022: BUN 12; Creatinine, Ser 0.90; Hemoglobin 15.5; Platelets 134; Potassium 4.3; Sodium 141     Physical Exam:    VS:  BP (!) 148/70   Pulse (!) 57   Ht 5\' 11"  (1.803 m)   Wt 237 lb 12.8 oz (107.9 kg)   SpO2 95%   BMI 33.17 kg/m     Wt Readings from Last 3 Encounters:  11/18/22 237 lb 12.8 oz (107.9 kg)  10/29/22 235 lb 9.6 oz (106.9 kg)  09/09/22 230 lb (104.3 kg)     GEN: Well nourished, well developed in no acute distress CARDIAC: RRR, no murmurs, rubs, gallops RESPIRATORY:  Normal work of breathing MUSCULOSKELETAL: no edema    ASSESSMENT & PLAN:    Atrial fibrillation - persistent With infrequent recurrence. For now, since recurrences occurring < 1/year, will monitor.  Resume flecainide if recurrences  become more frequent  Obesity We discussed the importance of weight loss for maintenance of sinus rhythm  OSA I encouraged ongoing use of CPAP        Medication Adjustments/Labs and Tests Ordered: Current medicines are reviewed at length with the patient today.  Concerns regarding medicines are outlined above.  No orders of the defined types were placed in this encounter.  No orders of the defined types were placed in this encounter.    Signed, Maurice Small, MD  11/18/2022 2:42 PM    [facility]

## 2022-11-18 NOTE — Patient Instructions (Signed)
Medication Instructions:  Your physician recommends that you continue on your current medications as directed. Please refer to the Current Medication list given to you today. *If you need a refill on your cardiac medications before your next appointment, please call your pharmacy*   Follow-Up: At [facility], you and your health needs are our priority.  As part of our continuing mission to provide you with exceptional heart care, we have created designated Provider Care Teams.  These Care Teams include your primary Cardiologist (physician) and Advanced Practice Providers (APPs -  Physician Assistants and Nurse Practitioners) who all work together to provide you with the care you need, when you need it.  We recommend signing up for the patient portal called "MyChart".  Sign up information is provided on this After Visit Summary.  MyChart is used to connect with patients for Virtual Visits (Telemedicine).  Patients are able to view lab/test results, encounter notes, upcoming appointments, etc.  Non-urgent messages can be sent to your provider as well.   To learn more about what you can do with MyChart, go to [email or web address].    Your next appointment:   6 month(s)  Provider:   Augustus Mealor, MD  

## 2023-01-13 ENCOUNTER — Telehealth (HOSPITAL_COMMUNITY): Payer: Self-pay

## 2023-01-13 ENCOUNTER — Encounter (HOSPITAL_BASED_OUTPATIENT_CLINIC_OR_DEPARTMENT_OTHER): Payer: Self-pay

## 2023-01-13 ENCOUNTER — Emergency Department (HOSPITAL_BASED_OUTPATIENT_CLINIC_OR_DEPARTMENT_OTHER): Payer: Medicare PPO

## 2023-01-13 ENCOUNTER — Other Ambulatory Visit: Payer: Self-pay

## 2023-01-13 ENCOUNTER — Emergency Department (HOSPITAL_BASED_OUTPATIENT_CLINIC_OR_DEPARTMENT_OTHER)
Admission: EM | Admit: 2023-01-13 | Discharge: 2023-01-13 | Disposition: A | Discharge status: Home / Self Care | Payer: Medicare PPO | Attending: Emergency Medicine | Admitting: Emergency Medicine

## 2023-01-13 DIAGNOSIS — I4892 Unspecified atrial flutter: Secondary | ICD-10-CM | POA: Diagnosis not present

## 2023-01-13 DIAGNOSIS — I4891 Unspecified atrial fibrillation: Secondary | ICD-10-CM | POA: Diagnosis not present

## 2023-01-13 DIAGNOSIS — R42 Dizziness and giddiness: Secondary | ICD-10-CM | POA: Diagnosis present

## 2023-01-13 DIAGNOSIS — R0602 Shortness of breath: Secondary | ICD-10-CM | POA: Insufficient documentation

## 2023-01-13 DIAGNOSIS — Z7901 Long term (current) use of anticoagulants: Secondary | ICD-10-CM | POA: Diagnosis not present

## 2023-01-13 LAB — CBC WITH DIFFERENTIAL/PLATELET
Abs Immature Granulocytes: 0.02 10*3/uL (ref 0.00–0.07)
Basophils Absolute: 0 10*3/uL (ref 0.0–0.1)
Basophils Relative: 0 %
Eosinophils Absolute: 0 10*3/uL (ref 0.0–0.5)
Eosinophils Relative: 1 %
HCT: 49.1 % (ref 39.0–52.0)
Hemoglobin: 17.1 g/dL — ABNORMAL HIGH (ref 13.0–17.0)
Immature Granulocytes: 0 %
Lymphocytes Relative: 11 %
Lymphs Abs: 0.8 10*3/uL (ref 0.7–4.0)
MCH: 32.6 pg (ref 26.0–34.0)
MCHC: 34.8 g/dL (ref 30.0–36.0)
MCV: 93.5 fL (ref 80.0–100.0)
Monocytes Absolute: 0.3 10*3/uL (ref 0.1–1.0)
Monocytes Relative: 4 %
Neutro Abs: 5.8 10*3/uL (ref 1.7–7.7)
Neutrophils Relative %: 84 %
Platelets: 141 10*3/uL — ABNORMAL LOW (ref 150–400)
RBC: 5.25 MIL/uL (ref 4.22–5.81)
RDW: 12.9 % (ref 11.5–15.5)
WBC: 6.9 10*3/uL (ref 4.0–10.5)
nRBC: 0 % (ref 0.0–0.2)

## 2023-01-13 LAB — COMPREHENSIVE METABOLIC PANEL
ALT: 27 U/L (ref 0–44)
AST: 17 U/L (ref 15–41)
Albumin: 4.4 g/dL (ref 3.5–5.0)
Alkaline Phosphatase: 55 U/L (ref 38–126)
Anion gap: 10 (ref 5–15)
BUN: 18 mg/dL (ref 8–23)
CO2: 23 mmol/L (ref 22–32)
Calcium: 9.3 mg/dL (ref 8.9–10.3)
Chloride: 105 mmol/L (ref 98–111)
Creatinine, Ser: 1.04 mg/dL (ref 0.61–1.24)
GFR, Estimated: >60 mL/min (ref >60)
Glucose, Bld: 189 mg/dL — ABNORMAL HIGH (ref 70–99)
Potassium: 4.4 mmol/L (ref 3.5–5.1)
Sodium: 138 mmol/L (ref 135–145)
Total Bilirubin: 0.8 mg/dL (ref 0.3–1.2)
Total Protein: 7.3 g/dL (ref 6.5–8.1)

## 2023-01-13 LAB — MAGNESIUM: Magnesium: 1.7 mg/dL (ref 1.7–2.4)

## 2023-01-13 MED ORDER — PROPOFOL 10 MG/ML IV BOLUS
0.5000 mg/kg | Freq: Once | INTRAVENOUS | Status: AC
  Administered 2023-01-13: 60 mg via INTRAVENOUS
  Filled 2023-01-13: qty 20

## 2023-01-13 MED ORDER — DILTIAZEM HCL 25 MG/5ML IV SOLN
20.0000 mg | Freq: Once | INTRAVENOUS | Status: AC
  Administered 2023-01-13: 20 mg via INTRAVENOUS
  Filled 2023-01-13: qty 5

## 2023-01-13 MED ORDER — PROPOFOL 10 MG/ML IV BOLUS
INTRAVENOUS | Status: AC | PRN
  Administered 2023-01-13 (×4): 20 mg via INTRAVENOUS

## 2023-01-13 MED ORDER — SODIUM CHLORIDE 0.9 % IV SOLN: Freq: Once | INTRAVENOUS | Status: AC

## 2023-01-13 MED ORDER — SODIUM CHLORIDE 0.9 % IV BOLUS
1000.0000 mL | Freq: Once | INTRAVENOUS | Status: AC
  Administered 2023-01-13: 1000 mL via INTRAVENOUS

## 2023-01-13 NOTE — Sedation Documentation (Signed)
Shock delivered at 100J. Return to Sinus brady

## 2023-01-13 NOTE — Sedation Documentation (Signed)
Unable to assess pain d/t procedure

## 2023-01-13 NOTE — ED Notes (Signed)
Pt ambulated in hall. Pt denies any dizziness or any other symptoms.

## 2023-01-13 NOTE — Discharge Instructions (Signed)
Follow-up with your cardiologist in the office.

## 2023-01-13 NOTE — Telephone Encounter (Signed)
Left message for patient to call back to schedule ED follow up appointment. ?

## 2023-01-13 NOTE — ED Notes (Signed)
Pt drinking water, no difficulties noted. Alert and oriented

## 2023-01-13 NOTE — ED Provider Notes (Signed)
[facility] Provider Note   CSN: [id number] Arrival date & time: 01/13/23  1043     History  Chief Complaint  Patient presents with   Dizziness    Carlos Thomas is a 72 y.o. male.  72 yo M with a chief complaints feeling palpitations.  This started about 1 PM yesterday.  He says that this happens to him sometimes when he drives to [place] and back.  He did this the day before.  Feels like he is been eating and drinking normally.  Denies cough congestion or fever denies nausea vomiting or diarrhea.  Denies any recent medication changes.  He does have a history of atrial fibrillation and took 2 extra doses of diltiazem.   Dizziness      Home Medications Prior to Admission medications   Medication Sig Start Date End Date Taking? Authorizing Provider  clotrimazole-betamethasone (LOTRISONE) cream Apply 1 Application topically as needed.    [provider]  Cyanocobalamin (VITAMIN B-12) 2500 MCG SUBL Take 2,500 mcg by mouth daily.    [provider]  diltiazem (CARDIZEM CD) 120 MG 24 hr capsule Take 1 capsule (120 mg total) by mouth 2 (two) times daily. 07/17/22   Newman Nip, NP  diltiazem (CARDIZEM) 30 MG tablet TAKE 1 TABLET EVERY 4 HOURS AS NEEDED A-FIB, HEART RATE OVER 100 07/17/22   Newman Nip, NP  omeprazole (PRILOSEC) 20 MG capsule Take 20 mg by mouth daily before breakfast.     [provider]  rivaroxaban (XARELTO) 20 MG TABS tablet Take 1 tablet (20 mg total) by mouth daily with supper. 07/17/22   Newman Nip, NP  tamsulosin (FLOMAX) 0.4 MG CAPS capsule Take 0.4 mg by mouth every 12 (twelve) hours.     [provider]      Allergies    Dextromethorphan    Review of Systems   Review of Systems  Neurological:  Positive for dizziness.    Physical Exam Updated Vital Signs BP 103/65   Pulse (!) 49   Temp 98.8 F (37.1 C) (Oral)   Resp (!) 21   Ht 5\' 11"  (1.803 m)   Wt  106.6 kg   SpO2 98%   BMI 32.78 kg/m  Physical Exam  ED Results / Procedures / Treatments   Labs (all labs ordered are listed, but only abnormal results are displayed) Labs Reviewed  CBC WITH DIFFERENTIAL/PLATELET - Abnormal; Notable for the following components:      Result Value   Hemoglobin 17.1 (*)    Platelets 141 (*)    All other components within normal limits  COMPREHENSIVE METABOLIC PANEL - Abnormal; Notable for the following components:   Glucose, Bld 189 (*)    All other components within normal limits  MAGNESIUM    EKG EKG Interpretation Date/Time:  Monday January 13 2023 12:23:12 EDT Ventricular Rate:  110 PR Interval:  173 QRS Duration:  88 QT Interval:  328 QTC Calculation: 444 R Axis:   115  Text Interpretation: Sinus or ectopic atrial tachycardia Right axis deviation Minimal ST depression, inferior leads Otherwise no significant change Confirmed by Melene Plan [phone]) on 01/13/2023 12:39:59 PM  Radiology DG Chest Port 1 View  Result Date: 01/13/2023 CLINICAL DATA:  Atrial fibrillation. EXAM: PORTABLE CHEST 1 VIEW COMPARISON:  September 09, 2022. FINDINGS: The heart size and mediastinal contours are within normal limits. Both lungs are clear. The visualized skeletal structures are unremarkable. IMPRESSION: No active disease.  Electronically Signed   By: Lupita Raider M.D.   On: 01/13/2023 11:40    Procedures .Cardioversion  Date/Time: 01/13/2023 2:14 PM  Performed by: Melene Plan, DO Authorized by: Melene Plan, DO   Consent:    Consent obtained:  Written   Consent given by:  Patient   Risks discussed:  Cutaneous burn, death, induced arrhythmia and pain   Alternatives discussed:  No treatment, rate-control medication, alternative treatment, observation and anti-coagulation medication Pre-procedure details:    Cardioversion basis:  Elective   Rhythm:  Atrial flutter   Electrode placement:  Anterior-posterior Patient sedated: Yes. Refer to sedation procedure  documentation for details of sedation.  Attempt one:    Cardioversion mode:  Synchronous   Waveform:  Biphasic   Shock (Joules):  100   Shock outcome:  Conversion to normal sinus rhythm Post-procedure details:    Patient status:  Alert   Patient tolerance of procedure:  Tolerated well, no immediate complications .Sedation  Date/Time: 01/13/2023 2:14 PM  Performed by: Melene Plan, DO Authorized by: Melene Plan, DO   Consent:    Consent obtained:  Written   Consent given by:  Patient   Risks discussed:  Allergic reaction, dysrhythmia, inadequate sedation, nausea, prolonged hypoxia resulting in organ damage, prolonged sedation necessitating reversal, respiratory compromise necessitating ventilatory assistance and intubation and vomiting   Alternatives discussed:  Analgesia without sedation and anxiolysis Universal protocol:    Immediately prior to procedure, a time out was called: yes     Patient identity confirmed:  Arm band and verbally with patient Indications:    Procedure performed:  Cardioversion   Procedure necessitating sedation performed by:  Physician performing sedation Pre-sedation assessment:    Time since last food or drink:  6   ASA classification: class 2 - patient with mild systemic disease     Mouth opening:  2 finger widths   Thyromental distance:  3 finger widths   Mallampati score:  II - soft palate, uvula, fauces visible   Neck mobility: reduced     Pre-sedation assessments completed and reviewed: airway patency, cardiovascular function, hydration status, mental status, nausea/vomiting, pain level, respiratory function and temperature   Immediate pre-procedure details:    Reviewed: vital signs     Verified: bag valve mask available, emergency equipment available, intubation equipment available, IV patency confirmed, oxygen available and suction available   Procedure details (see MAR for exact dosages):    Preoxygenation:  Nasal cannula   Sedation:  Propofol    Intra-procedure monitoring:  Blood pressure monitoring, cardiac monitor, continuous capnometry, continuous pulse oximetry and frequent LOC assessments   Intra-procedure events: none     Total Provider sedation time (minutes):  35 Post-procedure details:    Attendance: Constant attendance by certified staff until patient recovered     Recovery: Patient returned to pre-procedure baseline     Post-sedation assessments completed and reviewed: airway patency, cardiovascular function, hydration status, mental status, nausea/vomiting, pain level, respiratory function and temperature     Patient is stable for discharge or admission: yes     Procedure completion:  Tolerated well, no immediate complications .Critical Care  Performed by: Melene Plan, DO Authorized by: Melene Plan, DO   Critical care provider statement:    Critical care time (minutes):  35   Critical care time was exclusive of:  Separately billable procedures and treating other patients   Critical care was time spent personally by me on the following activities:  Development of treatment plan with patient  or surrogate, discussions with consultants, evaluation of patient's response to treatment, examination of patient, ordering and review of laboratory studies, ordering and review of radiographic studies, ordering and performing treatments and interventions, pulse oximetry, re-evaluation of patient's condition and review of old charts   Care discussed with: admitting provider       Medications Ordered in ED Medications  sodium chloride 0.9 % bolus 1,000 mL (0 mLs Intravenous Stopped 01/13/23 1305)  diltiazem (CARDIZEM) injection 20 mg (20 mg Intravenous Given 01/13/23 1120)  diltiazem (CARDIZEM) injection 20 mg (20 mg Intravenous Given 01/13/23 1235)  propofol (DIPRIVAN) 10 mg/mL bolus/IV push 53.3 mg (60 mg Intravenous Given 01/13/23 1327)  0.9 %  sodium chloride infusion ( Intravenous New Bag/Given 01/13/23 1321)  propofol (DIPRIVAN) 10  mg/mL bolus/IV push (20 mg Intravenous Given 01/13/23 1331)    ED Course/ Medical Decision Making/ A&P                             Medical Decision Making Amount and/or Complexity of Data Reviewed Labs: ordered. Radiology: ordered. ECG/medicine tests: ordered.  Risk Prescription drug management.   72 yo M with a chief complaint of palpitations going on since 1 PM yesterday.  Patient is in likely atrial fibrillation with rapid ventricular response.  He is given a bolus dose of Cardizem and a bag of IV fluids.  Blood work without significant electrolyte abnormality.  Patient had a heart rate improvement with diltiazem, seems to be fixed at about a rate of 110.  Will attempt ambulation repeat bolus dose.  No change with repeat dosing.  I think this is much more consistent with a fixed block with atrial flutter.  Patient was cardioverted with improvement.  Given cardiology follow-up.  CHA2DS2/VAS Stroke Risk Points  Current as of about an hour ago     2 >= 2 Points: High Risk  1 to 1.99 Points: Medium Risk  0 Points: Low Risk    Last Change: N/A      Details    This score determines the patient's risk of having a stroke if the  patient has atrial fibrillation.       Points Metrics  0 Has Congestive Heart Failure:  No    Current as of about an hour ago  0 Has Vascular Disease:  No    Current as of about an hour ago  1 Has Hypertension:  Yes    Current as of about an hour ago  1 Age:  74    Current as of about an hour ago  0 Has Diabetes:  No    Current as of about an hour ago  0 Had Stroke:  No  Had TIA:  No  Had Thromboembolism:  No    Current as of about an hour ago  0 Male:  No    Current as of about an hour ago          2:16 PM:  I have discussed the diagnosis/risks/treatment options with the patient.  Evaluation and diagnostic testing in the emergency department does not suggest an emergent condition requiring admission or immediate intervention beyond what has  been performed at this time.  They will follow up with Cards. We also discussed returning to the ED immediately if new or worsening sx occur. We discussed the sx which are most concerning (e.g., sudden worsening pain, fever, inability to tolerate by mouth) that necessitate immediate return. Medications administered to  the patient during their visit and any new prescriptions provided to the patient are listed below.  Medications given during this visit Medications  sodium chloride 0.9 % bolus 1,000 mL (0 mLs Intravenous Stopped 01/13/23 1305)  diltiazem (CARDIZEM) injection 20 mg (20 mg Intravenous Given 01/13/23 1120)  diltiazem (CARDIZEM) injection 20 mg (20 mg Intravenous Given 01/13/23 1235)  propofol (DIPRIVAN) 10 mg/mL bolus/IV push 53.3 mg (60 mg Intravenous Given 01/13/23 1327)  0.9 %  sodium chloride infusion ( Intravenous New Bag/Given 01/13/23 1321)  propofol (DIPRIVAN) 10 mg/mL bolus/IV push (20 mg Intravenous Given 01/13/23 1331)     The patient appears reasonably screen and/or stabilized for discharge and I doubt any other medical condition or other [facility] requiring further screening, evaluation, or treatment in the ED at this time prior to discharge.          Final Clinical Impression(s) / ED Diagnoses Final diagnoses:  Atrial flutter with rapid ventricular response (HCC)    Rx / DC Orders ED Discharge Orders          Ordered    Ambulatory referral to Cardiology       Comments: If you have not heard from the Cardiology office within the next 72 hours please call [phone].   01/13/23 1413              Melene Plan, DO 01/13/23 1416

## 2023-01-13 NOTE — Progress Notes (Signed)
Patient prepared for sedation. Placed on ETCO2, suction setup and AMBU bag at bedside.

## 2023-01-13 NOTE — ED Triage Notes (Signed)
States today felt lightheaded with standing. Palpitations and chest discomfort.   Took 30mg  cardizem appx every 4 hours since 1300 yesterday.

## 2023-02-03 ENCOUNTER — Other Ambulatory Visit: Payer: Self-pay

## 2023-02-03 ENCOUNTER — Emergency Department (HOSPITAL_BASED_OUTPATIENT_CLINIC_OR_DEPARTMENT_OTHER)
Admission: EM | Admit: 2023-02-03 | Discharge: 2023-02-03 | Disposition: A | Discharge status: Home / Self Care | Payer: Medicare PPO | Attending: Emergency Medicine | Admitting: Emergency Medicine

## 2023-02-03 ENCOUNTER — Emergency Department (HOSPITAL_BASED_OUTPATIENT_CLINIC_OR_DEPARTMENT_OTHER): Payer: Medicare PPO

## 2023-02-03 ENCOUNTER — Encounter (HOSPITAL_BASED_OUTPATIENT_CLINIC_OR_DEPARTMENT_OTHER): Payer: Self-pay | Admitting: Emergency Medicine

## 2023-02-03 ENCOUNTER — Inpatient Hospital Stay (HOSPITAL_COMMUNITY): Admission: RE | Admit: 2023-02-03 | Payer: Medicare PPO | Source: Ambulatory Visit | Admitting: Internal Medicine

## 2023-02-03 DIAGNOSIS — Z79899 Other long term (current) drug therapy: Secondary | ICD-10-CM | POA: Diagnosis not present

## 2023-02-03 DIAGNOSIS — Z7901 Long term (current) use of anticoagulants: Secondary | ICD-10-CM | POA: Insufficient documentation

## 2023-02-03 DIAGNOSIS — I4891 Unspecified atrial fibrillation: Secondary | ICD-10-CM | POA: Diagnosis not present

## 2023-02-03 DIAGNOSIS — R002 Palpitations: Secondary | ICD-10-CM | POA: Diagnosis present

## 2023-02-03 LAB — BASIC METABOLIC PANEL
Anion gap: 8 (ref 5–15)
BUN: 17 mg/dL (ref 8–23)
CO2: 25 mmol/L (ref 22–32)
Calcium: 9.1 mg/dL (ref 8.9–10.3)
Chloride: 106 mmol/L (ref 98–111)
Creatinine, Ser: 1.01 mg/dL (ref 0.61–1.24)
GFR, Estimated: >60 mL/min (ref >60)
Glucose, Bld: 181 mg/dL — ABNORMAL HIGH (ref 70–99)
Potassium: 4.2 mmol/L (ref 3.5–5.1)
Sodium: 139 mmol/L (ref 135–145)

## 2023-02-03 LAB — CBC
HCT: 48.7 % (ref 39.0–52.0)
Hemoglobin: 16.8 g/dL (ref 13.0–17.0)
MCH: 32.7 pg (ref 26.0–34.0)
MCHC: 34.5 g/dL (ref 30.0–36.0)
MCV: 94.7 fL (ref 80.0–100.0)
Platelets: 135 10*3/uL — ABNORMAL LOW (ref 150–400)
RBC: 5.14 MIL/uL (ref 4.22–5.81)
RDW: 12.9 % (ref 11.5–15.5)
WBC: 4.7 10*3/uL (ref 4.0–10.5)
nRBC: 0 % (ref 0.0–0.2)

## 2023-02-03 MED ORDER — PROPOFOL 10 MG/ML IV BOLUS
INTRAVENOUS | Status: AC | PRN
  Administered 2023-02-03: 20 mg via INTRAVENOUS
  Administered 2023-02-03: 50 mg via INTRAVENOUS
  Administered 2023-02-03: 20 mg via INTRAVENOUS

## 2023-02-03 MED ORDER — PROPOFOL 10 MG/ML IV BOLUS
0.5000 mg/kg | Freq: Once | INTRAVENOUS | Status: AC
  Administered 2023-02-03: 53.3 mg via INTRAVENOUS
  Filled 2023-02-03: qty 20

## 2023-02-03 MED ORDER — SODIUM CHLORIDE 0.9 % IV SOLN: Freq: Once | INTRAVENOUS | Status: AC

## 2023-02-03 NOTE — ED Notes (Signed)
Pt wife at bedside. She is driving pt home Pt reports no pain  Aldrete is 10

## 2023-02-03 NOTE — ED Notes (Signed)
Pt was administered shock at 120j by EDP- Belfi

## 2023-02-03 NOTE — Discharge Instructions (Signed)
Follow-up in the cardiology clinic listed above.  Return to the emergency room if you have any worsening symptoms.

## 2023-02-03 NOTE — ED Provider Notes (Signed)
[facility] Provider Note   CSN: [id number] Arrival date & time: 02/03/23  1013     History  Chief Complaint  Patient presents with   Tachycardia    Carlos Thomas is a 72 y.o. male.  Patient is a 72 year old male who presents with palpitations.  He has a history of atrial fibrillation and is on Xarelto.  He states he is compliant with his Xarelto.  He has not missed any doses.  He reports palpitations that started earlier this morning.  He took a dose of his short acting Cardizem 30 mg at about 430 this morning.  He took his regular 120 mg 24-hour dose as well as an extra 30 mg tablet at about 730 this morning.  The palpitations have continued.  He has urinary frequency which he says he always has when he goes into A-fib.  He denies any shortness of breath.  No dizziness.  Otherwise feels okay.  No chest pain or shortness of breath.  On chart review, he has been cardioverted twice this year.  Once about 2 weeks ago and once in the spring.  He is followed by [facility] health cardiology.       Home Medications Prior to Admission medications   Medication Sig Start Date End Date Taking? Authorizing Provider  clotrimazole-betamethasone (LOTRISONE) cream Apply 1 Application topically as needed.    [provider]  Cyanocobalamin (VITAMIN B-12) 2500 MCG SUBL Take 2,500 mcg by mouth daily.    [provider]  diltiazem (CARDIZEM CD) 120 MG 24 hr capsule Take 1 capsule (120 mg total) by mouth 2 (two) times daily. 07/17/22   Newman Nip, NP  diltiazem (CARDIZEM) 30 MG tablet TAKE 1 TABLET EVERY 4 HOURS AS NEEDED A-FIB, HEART RATE OVER 100 07/17/22   Newman Nip, NP  omeprazole (PRILOSEC) 20 MG capsule Take 20 mg by mouth daily before breakfast.     [provider]  rivaroxaban (XARELTO) 20 MG TABS tablet Take 1 tablet (20 mg total) by mouth daily with supper. 07/17/22   Newman Nip, NP  tamsulosin (FLOMAX) 0.4 MG  CAPS capsule Take 0.4 mg by mouth every 12 (twelve) hours.     [provider]      Allergies    Dextromethorphan    Review of Systems   Review of Systems  Constitutional:  Negative for chills, diaphoresis, fatigue and fever.  HENT:  Negative for congestion, rhinorrhea and sneezing.   Eyes: Negative.   Respiratory:  Negative for cough, chest tightness and shortness of breath.   Cardiovascular:  Positive for palpitations. Negative for chest pain and leg swelling.  Gastrointestinal:  Negative for abdominal pain, blood in stool, diarrhea, nausea and vomiting.  Genitourinary:  Positive for frequency. Negative for difficulty urinating, flank pain and hematuria.  Musculoskeletal:  Negative for arthralgias and back pain.  Skin:  Negative for rash.  Neurological:  Negative for dizziness, speech difficulty, weakness, numbness and headaches.    Physical Exam Updated Vital Signs BP (!) 132/91   Pulse (!) 110   Temp 97.6 F (36.4 C) (Oral)   Resp 17   SpO2 96%  Physical Exam Constitutional:      Appearance: He is well-developed.  HENT:     Head: Normocephalic and atraumatic.  Eyes:     Pupils: Pupils are equal, round, and reactive to light.  Cardiovascular:     Rate and Rhythm: Regular rhythm. Tachycardia present.  Heart sounds: Normal heart sounds.  Pulmonary:     Effort: Pulmonary effort is normal. No respiratory distress.     Breath sounds: Normal breath sounds. No wheezing or rales.  Chest:     Chest wall: No tenderness.  Abdominal:     General: Bowel sounds are normal.     Palpations: Abdomen is soft.     Tenderness: There is no abdominal tenderness. There is no guarding or rebound.  Musculoskeletal:        General: Normal range of motion.     Cervical back: Normal range of motion and neck supple.  Lymphadenopathy:     Cervical: No cervical adenopathy.  Skin:    General: Skin is warm and dry.     Findings: No rash.  Neurological:     Mental Status: He is  alert and oriented to person, place, and time.     ED Results / Procedures / Treatments   Labs (all labs ordered are listed, but only abnormal results are displayed) Labs Reviewed  CBC - Abnormal; Notable for the following components:      Result Value   Platelets 135 (*)    All other components within normal limits  BASIC METABOLIC PANEL  PROTIME-INR    EKG EKG Interpretation Date/Time:  Monday February 03 2023 10:28:24 EDT Ventricular Rate:  137 PR Interval:  126 QRS Duration:  85 QT Interval:  287 QTC Calculation: 434 R Axis:   135  Text Interpretation: atrial fibrillation with RVR Right axis deviation Probable anteroseptal infarct, old ST depression, probably rate related Confirmed by Rolan Bucco [phone]) on 02/03/2023 10:38:18 AM  Radiology No results found.  Procedures .Sedation  Date/Time: 02/03/2023 12:29 PM  Performed by: Rolan Bucco, MD Authorized by: Rolan Bucco, MD   Consent:    Consent obtained:  Written   Consent given by:  Patient   Risks discussed:  Allergic reaction, prolonged hypoxia resulting in organ damage, dysrhythmia, inadequate sedation, respiratory compromise necessitating ventilatory assistance and intubation and vomiting Universal protocol:    Immediately prior to procedure, a time out was called: yes     Patient identity confirmed:  Verbally with patient Indications:    Procedure performed:  Cardioversion   Procedure necessitating sedation performed by:  Physician performing sedation Pre-sedation assessment:    Time since last food or drink:  5   ASA classification: class 2 - patient with mild systemic disease     Mouth opening:  2 finger widths   Thyromental distance:  3 finger widths   Mallampati score:  II - soft palate, uvula, fauces visible   Neck mobility: normal     Pre-sedation assessments completed and reviewed: airway patency, cardiovascular function, hydration status, mental status, nausea/vomiting, pain level,  respiratory function and temperature     Pre-sedation assessment completed:  02/03/2023 12:00 PM Immediate pre-procedure details:    Reviewed: vital signs, relevant labs/tests and NPO status     Verified: bag valve mask available, emergency equipment available, intubation equipment available, IV patency confirmed, oxygen available and suction available   Procedure details (see MAR for exact dosages):    Preoxygenation:  Nasal cannula   Sedation:  Propofol   Intended level of sedation: deep   Intra-procedure monitoring:  Blood pressure monitoring, cardiac monitor, continuous capnometry, continuous pulse oximetry, frequent LOC assessments and frequent vital sign checks   Intra-procedure events: none     Total Provider sedation time (minutes):  10 Post-procedure details:    Post-sedation assessment completed:  02/03/2023  12:31 PM   Attendance: Constant attendance by certified staff until patient recovered     Recovery: Patient returned to pre-procedure baseline     Post-sedation assessments completed and reviewed: airway patency, cardiovascular function, hydration status, mental status, nausea/vomiting, pain level and respiratory function     Patient is stable for discharge or admission: yes     Procedure completion:  Tolerated well, no immediate complications .Cardioversion  Date/Time: 02/03/2023 12:31 PM  Performed by: Rolan Bucco, MD Authorized by: Rolan Bucco, MD   Consent:    Consent obtained:  Written   Consent given by:  Patient   Risks discussed:  Cutaneous burn, death, induced arrhythmia and pain   Alternatives discussed:  No treatment Pre-procedure details:    Cardioversion basis:  Emergent   Rhythm:  Atrial fibrillation Patient sedated: Yes. Refer to sedation procedure documentation for details of sedation.  Attempt one:    Cardioversion mode:  Synchronous   Waveform:  Biphasic   Shock (Joules):  120   Shock outcome:  Conversion to normal sinus rhythm Post-procedure  details:    Patient status:  Alert   Patient tolerance of procedure:  Tolerated well, no immediate complications     Medications Ordered in ED Medications - No data to display  ED Course/ Medical Decision Making/ A&P                                 Medical Decision Making Amount and/or Complexity of Data Reviewed External Data Reviewed: ECG and notes. Labs: ordered. Decision-making details documented in ED Course. ECG/medicine tests: ordered and independent interpretation performed. Decision-making details documented in ED Course.  Risk Decision regarding hospitalization.   Patient is a 72 year old who presents with palpitations.  His first EKG showed A-fib with RVR.  Repeat EKG showed what looks like atrial flutter versus sinus rhythm.  I suspect it is a flutter given that his heart rate is still elevated around 111.  However I did consult with Dr. Bjorn Pippin with cardiology.  He agrees that it does look like atrial flutter and recommends ED cardioversion.  Patient was cardioverted without incident.  He is feeling much better.  Repeat EKG shows a sinus bradycardia which is his baseline rhythm.  His labs are reviewed and are nonconcerning.  He was discharged home in good condition.  Dr. Bjorn Pippin is arranging follow-up in the electrophysiology office.  Given patient's frequent cardioversions this year, may need to be restarted on an antiarrhythmic such as flecainide or have a repeat ablation.  CRITICAL CARE Performed by: Rolan Bucco Total critical care time: 45 minutes Critical care time was exclusive of separately billable procedures and treating other patients. Critical care was necessary to treat or prevent imminent or life-threatening deterioration. Critical care was time spent personally by me on the following activities: development of treatment plan with patient and/or surrogate as well as nursing, discussions with consultants, evaluation of patient's response to treatment,  examination of patient, obtaining history from patient or surrogate, ordering and performing treatments and interventions, ordering and review of laboratory studies, ordering and review of radiographic studies, pulse oximetry and re-evaluation of patient's condition.   Final Clinical Impression(s) / ED Diagnoses Final diagnoses:  None    Rx / DC Orders ED Discharge Orders     None         Rolan Bucco, MD 02/03/23 1237

## 2023-02-03 NOTE — ED Notes (Signed)
EKG noted to be performed by staff after shock given

## 2023-02-03 NOTE — ED Triage Notes (Signed)
Reports palpitation , Hx A fib , no symptoms . No chest pain or shortness of breath , alert and oriented x 4 .

## 2023-02-06 ENCOUNTER — Ambulatory Visit (HOSPITAL_COMMUNITY)
Admit: 2023-02-06 | Discharge: 2023-02-06 | Disposition: A | Discharge status: Home / Self Care | Payer: Medicare PPO | Source: Ambulatory Visit | Attending: Internal Medicine | Admitting: Internal Medicine

## 2023-02-06 ENCOUNTER — Encounter (HOSPITAL_COMMUNITY): Payer: Self-pay | Admitting: Internal Medicine

## 2023-02-06 VITALS — BP 140/82 | HR 63 | Ht 71.0 in | Wt 235.2 lb

## 2023-02-06 DIAGNOSIS — I445 Left posterior fascicular block: Secondary | ICD-10-CM | POA: Insufficient documentation

## 2023-02-06 DIAGNOSIS — I4892 Unspecified atrial flutter: Secondary | ICD-10-CM | POA: Diagnosis not present

## 2023-02-06 DIAGNOSIS — Z7901 Long term (current) use of anticoagulants: Secondary | ICD-10-CM | POA: Insufficient documentation

## 2023-02-06 DIAGNOSIS — I480 Paroxysmal atrial fibrillation: Secondary | ICD-10-CM

## 2023-02-06 DIAGNOSIS — G4733 Obstructive sleep apnea (adult) (pediatric): Secondary | ICD-10-CM | POA: Diagnosis not present

## 2023-02-06 DIAGNOSIS — I10 Essential (primary) hypertension: Secondary | ICD-10-CM | POA: Insufficient documentation

## 2023-02-06 DIAGNOSIS — Z79899 Other long term (current) drug therapy: Secondary | ICD-10-CM | POA: Insufficient documentation

## 2023-02-06 MED ORDER — FLECAINIDE ACETATE 50 MG PO TABS: 50.0000 mg | ORAL_TABLET | Freq: Two times a day (BID) | ORAL | 3 refills | Status: DC

## 2023-02-06 NOTE — Patient Instructions (Addendum)
Start Flecainide 50mg twice a day 

## 2023-02-06 NOTE — ED Notes (Signed)
Pre-procedure checklist completed prior to Time out and medication administration.

## 2023-02-06 NOTE — Progress Notes (Signed)
Primary Care Physician: Noberto Retort, MD Referring Physician: [facility] ER f/u EP: Dr. Nelly Laurence (formerly Dr. Johney Frame)    Carlos Thomas is a 72 y.o. male with a h/o afib s/p w ablations, 2010 and 2013 that intermittently requires cardioversion, on flecainide. Pt was involved in a traffic situation that escalated his HR on Friday and immediately felt his heart go into afib. He thought he took an extra flecainide but by mistake, he took a nausea pill. He went to the  ER on Saturday but the ER doc did not want to cardiovert as he was rate controlled. He returned on Sunday to the ER with afib around 120 bpm and received successful  cardioversion.  On last visit in April 2019, he continued to maintain S brady at 50 bpm. Continues on xarelto 20 mg daily. He was given an extra 120 mg diltiazem to add to his 240 mg daily Cardizem in the ER.  F/u afib clinic, 12/6. He is in SR and has not had any afib but has neuropathy and his neurologist said that flecainide may be worsening his symptoms. He says that he has had neuropathy  for awhile and the symptoms are stable. He does not want to stop flecainide but wanted to know our experience with this.   F/u in afib clinic, 09/28/19. I have not seen pt since 2019. He decided to stay on the flecainide after his last visit as  we discussed staying on flecainide despite it may be worsening his peripheral neuropathy as mentioned by his neurologist. He states that it has stayed about the same and he prefers to continue its use. He has not noted any afib. EKG shows sinus brady at 46 bpm, LAFB.   F/u in afib clinic, 5/27. He had a very busy day yesterday, doing multiple activities outside around a lot of pollen. He coughed  hard last pm and wento out of rhythm. He took his Cardizem and his flecainide and when he was still out of rhythm a couple hours later, he went to [facility]. After unsuccessfully  trying to convert with IV Cardizem, he was successfully cardioverted.  He did have some  issues with hypotension and bradycardia afterward. He did not leave there until 7:30 this am. Now his BP is normal and he remains  in sinus brady at 53 bpm. He is very tired for being up all night.   F/u in afib clinic, 12/01/19. He had another cardioversion 5/29 and is quite upset that he has had 2 episodes in the last few days. We discussed that he did not go to have an urgent cardioversion if he was stable. He does come out of cardioversion bradycardic and with low BP with an extra 120 mg Cardizem po  or at the time of his first DCCV, Cardizem was used first IV. We did discuss that he could use 30 mg Cardizem that may convert without going to the  ER and not drop his BP/HR as much. He has himself gone back up to 120 mg bid of Cardizem but have asked him not to increase further as he had HR's in the 40's with higher doses of CCB. Now HR mid 50's. He had an atrial flutter ablation in 2010 and an afib ablation in 2013 and may be time to see Dr. Johney Frame  for an repeat afib ablation as he has had a very long time in SR. He will need updated echo. The other option would be to change  antiarrythmic to Tikosyn.  F/u in afib ablation 02/29/20.He is now one month s/p ablation. He only has noted one episode afib and that was drinking regular coffee given to him in a restaurant when he had asked for decaf. He took an extra cardizem 30 mg and it calmed down quickly. He continues on flecainide/cardizem as well as xarelto 20 mg daily for a CHA2DS2VASc dcore of at least 2.   F/u in afib clinic, 5/10, after cardioversion in the ER for atrial flutter at 150 bpm. He presented there after flecainde x 2 and prn cardizem did not touch the 150 bpm rate.  It was successful and he remains in SR. Feels that family stress trigger this episode. No further arrhythmia since CV. He has noted a few elevated  BP's in the pm around 150 systolic. Encouraged to keep readings to review with Dr. Johney Frame in June. 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 Johney Frame.   F/u in afib clinic, 07/17/22, to f/u Dr. Jenel Lucks last appointment  December 2022.  He states he has not noted any afib. Off flecainide  for some time. BP mildly elevated in office, at home stable. EKG shows sinus brady at 53 bpm. Will call and get recent lab results form PCP.   F/u in Afib clinic, 02/03/23. He is s/p ED evaluation due to atrial flutter on 01/13/23 s/p successful DCCV. He is again s/p ED evaluation on 8/5 due to Afib with RVR s/p successful DCCV. He is currently in NSR. He is interested in medication management to avoid the ER. He has not missed any doses of Xarelto.  Today, he denies symptoms of palpitations, chest pain, shortness of breath, orthopnea, PND, lower extremity edema, dizziness, presyncope, syncope, or neurologic sequela. The patient is tolerating medications without difficulties and is otherwise without complaint today.   Past Medical History:  Diagnosis Date   Anticoagulant long-term use    xarelto   Balanitis    GERD (gastroesophageal reflux disease)    History of adenomatous polyp of colon    2006  tubular adenoma's and hyperplastic polyp's;   2011  tubular adenoma   History of atrial flutter    s/p ablation atrial flutter/atrial fib in 2010   History of MRSA infection    1990's  infected wound near surgerical area    History of repair of hiatal hernia    HTN (hypertension)    Obesity (BMI 30-39.9)    OSA on CPAP    Mild OSA  per study 05-07-2012-- w AHI 6.57/hr now on 13cm H2O   PAF (paroxysmal atrial fibrillation) [facility]) primary cardiologist-  dr Anne Fu   ep cardiologist-  dr Johney Frame---  s/p  ablation afib and aflutter 2010  and  ablation afib/svt  2013   Peripheral neuropathy, hereditary/idiopathic    bilateral feet---  neurologist- dr patel   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 CATH LAB;  Service: Cardiovascular;  Laterality: N/A;   ATRIAL FIBRILLATION ABLATION N/A 02/01/2020   Procedure: ATRIAL FIBRILLATION ABLATION;  Surgeon:  Hillis Range, MD;  Location: [facility] INVASIVE CV LAB;  Service: Cardiovascular;  Laterality: N/A;   CARDIAC ELECTROPHYSIOLOGY MAPPING AND ABLATION  [phone]   dr allred   successful ablation atrial fibrillation /  atrial flutter   CARDIAC ELECTROPHYSIOLOGY MAPPING AND ABLATION  09-13-2011   dr Johney Frame   afib mapping/ ablation and  mapping/ablation in the right atrium   CARDIOVASCULAR STRESS TEST  09/2006   per dr Anne Fu note nuclear study normal w/ no ischemia   CARDIOVERSION N/A 09/21/2015   Procedure: CARDIOVERSION;  Surgeon: Lewayne Bunting, MD;  Location: [facility] ENDOSCOPY;  Service: Cardiovascular;  Laterality: N/A;   CIRCUMCISION N/A 06/27/2016   Procedure: CIRCUMCISION ADULT;  Surgeon: Jethro Bolus, MD;  Location: [facility];  Service: Urology;  Laterality: N/A;   COLONOSCOPY  last one 02/  2011   DIRECT CURRENT CARDIOVERSION  02/02/2010   dr [name]   LAPAROSCOPIC CHOLECYSTECTOMY  1990's   OPEN HIATAL HERNIA REPAIR  1970's   TEE WITHOUT CARDIOVERSION  09/12/2011   Procedure: TRANSESOPHAGEAL ECHOCARDIOGRAM (TEE);  Surgeon: Corky Crafts, MD;  Location: [facility] ENDOSCOPY;  Service: Cardiovascular;  Laterality: N/A;  trace AR, no LA/LAA thrombus, normal LVfunction, moderate descending aortic atherosclerosis   TEE WITHOUT CARDIOVERSION N/A 09/21/2015   Procedure: TRANSESOPHAGEAL ECHOCARDIOGRAM (TEE);  Surgeon: Lewayne Bunting, MD;  Location: [facility] ENDOSCOPY;  Service: Cardiovascular;  Laterality: N/A; normal LV  systolic funciton,  moderate LAE,  no LAA thrombus,  trace MR and TR   TRANSTHORACIC ECHOCARDIOGRAM  06/03/2014   ef 55-60%/  trivial MR and TR    Current Outpatient Medications  Medication Sig Dispense Refill   clotrimazole-betamethasone (LOTRISONE) cream Apply 1 Application topically as needed.     Cyanocobalamin (VITAMIN B-12) 2500 MCG SUBL Take 2,500 mcg by mouth daily.     diltiazem (CARDIZEM CD) 120 MG 24 hr capsule Take 1 capsule (120 mg total) by mouth 2 (two) times daily. 60 capsule 6   diltiazem (CARDIZEM) 30 MG tablet TAKE 1 TABLET EVERY 4 HOURS AS NEEDED A-FIB, HEART RATE OVER 100 45 tablet 1   omeprazole (PRILOSEC) 20 MG capsule Take 20 mg by mouth daily before breakfast.      rivaroxaban (XARELTO) 20 MG TABS tablet Take 1 tablet (20 mg total) by mouth daily with supper. 90 tablet 1   tamsulosin (FLOMAX) 0.4 MG CAPS capsule Take 0.4 mg by mouth every 12 (twelve) hours.      No current facility-administered medications for this visit.    Allergies  Allergen Reactions   Dextromethorphan Other (See Comments)    Triggers afib/hypertension   ROS- All systems are reviewed and negative except as per the HPI above  Physical Exam: There were no vitals filed for this visit.  Wt Readings from Last 3 Encounters:  02/03/23 106.6 kg  01/13/23 106.6 kg  11/18/22 107.9 kg    Labs: Lab Results  Component Value Date   NA 139 02/03/2023   K 4.2 02/03/2023   CL 106 02/03/2023   CO2 25 02/03/2023   GLUCOSE 181 (H) 02/03/2023   BUN 17 02/03/2023   CREATININE 1.01 02/03/2023   CALCIUM 9.1 02/03/2023   MG 1.7 01/13/2023   Lab Results  Component Value Date   INR 1.9 (H) 09/09/2022   No results found for: "CHOL", "HDL", "LDLCALC", "TRIG"  GEN- The patient is well appearing, alert and oriented x 3 today.   Neck - no JVD or carotid bruit noted Lungs- Clear to ausculation bilaterally, normal work  of breathing Heart- Regular rate and rhythm, no murmurs, rubs or gallops, PMI not  laterally displaced Extremities- no clubbing, cyanosis, or edema Skin - no rash or ecchymosis noted   EKG-   Vent. rate 63 BPM PR interval 160 ms QRS duration 88 ms QT/QTcB 390/399 ms P-R-T axes 65 119 25 Sinus rhythm with Premature supraventricular complexes Left posterior fascicular block Abnormal ECG When compared with ECG of 03-Feb-2023 12:22, PREVIOUS ECG IS PRESENT  ECHO 12/08/19:  1. Left ventricular ejection fraction, by estimation, is 55 to 60%. The  left ventricle has normal function. The left ventricle has no regional  wall motion abnormalities. Left ventricular diastolic parameters were  normal.   2. Right ventricular systolic function is normal. The right ventricular  size is normal.   3. The mitral valve is normal in structure. Trivial mitral valve  regurgitation. No evidence of mitral stenosis.   4. The aortic valve is tricuspid. Aortic valve regurgitation is not  visualized. No aortic stenosis is present.   5. Aortic dilatation noted. There is mild dilatation at the level of the  sinuses of Valsalva measuring 38 mm.   6. The inferior vena cava is normal in size with greater than 50%  respiratory variability, suggesting right atrial pressure of 3 mmHg.    Assessment and Plan: 1. Paroxysmal afib/flutter  Staying in SR Successful ER CV, 10/31/20,  for aflutter at 150 bpm, refractory to meds (prn flecainde and prn cardizem)  He is s/p 3rd ablation 02/01/20 S/p DCCV on 01/13/23 (in the ED) S/p DCCV on 02/03/23 (in the ED)  He is currently in NSR.  After discussion, will restart flecainide as also recommended by Dr. Nelly Laurence. Repeat ECG in 7-10 days. He has a baseline left posterior fascicular block that has been noted historically while on flecainide.   Continue diltiazem at 120 mg bid    2.  Chadsvasc score of at least  2  Continue xarelto 20 mg daily    3. OSA Continue CPAP  4. HTN Stable at home    Repeat ECG in 7-10 days.    Lake Bells, PA-C Afib  [facility] [address] [place], [place] [address] [phone]

## 2023-02-10 ENCOUNTER — Other Ambulatory Visit (HOSPITAL_COMMUNITY): Payer: Self-pay

## 2023-02-10 MED ORDER — DILTIAZEM HCL ER COATED BEADS 120 MG PO CP24: 120.0000 mg | ORAL_CAPSULE | Freq: Two times a day (BID) | ORAL | 1 refills | Status: DC

## 2023-02-17 ENCOUNTER — Encounter (HOSPITAL_COMMUNITY): Payer: Medicare PPO | Admitting: Internal Medicine

## 2023-02-17 ENCOUNTER — Encounter (HOSPITAL_COMMUNITY): Payer: Self-pay

## 2023-03-20 ENCOUNTER — Other Ambulatory Visit (HOSPITAL_COMMUNITY): Payer: Self-pay | Admitting: *Deleted

## 2023-03-20 DIAGNOSIS — I480 Paroxysmal atrial fibrillation: Secondary | ICD-10-CM

## 2023-03-20 MED ORDER — RIVAROXABAN 20 MG PO TABS
20.0000 mg | ORAL_TABLET | Freq: Every day | ORAL | 1 refills | Status: DC
Start: 2023-03-20 — End: 2023-09-19

## 2023-03-22 ENCOUNTER — Emergency Department (HOSPITAL_BASED_OUTPATIENT_CLINIC_OR_DEPARTMENT_OTHER)
Admission: EM | Admit: 2023-03-22 | Discharge: 2023-03-22 | Disposition: A | Discharge status: Home / Self Care | Payer: Medicare PPO | Attending: Emergency Medicine | Admitting: Emergency Medicine

## 2023-03-22 ENCOUNTER — Other Ambulatory Visit: Payer: Self-pay

## 2023-03-22 ENCOUNTER — Encounter (HOSPITAL_BASED_OUTPATIENT_CLINIC_OR_DEPARTMENT_OTHER): Payer: Self-pay | Admitting: Emergency Medicine

## 2023-03-22 DIAGNOSIS — I4891 Unspecified atrial fibrillation: Secondary | ICD-10-CM | POA: Diagnosis not present

## 2023-03-22 DIAGNOSIS — Z7901 Long term (current) use of anticoagulants: Secondary | ICD-10-CM | POA: Diagnosis not present

## 2023-03-22 DIAGNOSIS — I10 Essential (primary) hypertension: Secondary | ICD-10-CM | POA: Diagnosis not present

## 2023-03-22 DIAGNOSIS — R002 Palpitations: Secondary | ICD-10-CM | POA: Diagnosis present

## 2023-03-22 LAB — BASIC METABOLIC PANEL
Anion gap: 10 (ref 5–15)
BUN: 18 mg/dL (ref 8–23)
CO2: 23 mmol/L (ref 22–32)
Calcium: 8.8 mg/dL — ABNORMAL LOW (ref 8.9–10.3)
Chloride: 104 mmol/L (ref 98–111)
Creatinine, Ser: 0.99 mg/dL (ref 0.61–1.24)
GFR, Estimated: >60 mL/min (ref >60)
Glucose, Bld: 153 mg/dL — ABNORMAL HIGH (ref 70–99)
Potassium: 3.9 mmol/L (ref 3.5–5.1)
Sodium: 137 mmol/L (ref 135–145)

## 2023-03-22 LAB — CBC
HCT: 49.3 % (ref 39.0–52.0)
Hemoglobin: 16.8 g/dL (ref 13.0–17.0)
MCH: 32.6 pg (ref 26.0–34.0)
MCHC: 34.1 g/dL (ref 30.0–36.0)
MCV: 95.7 fL (ref 80.0–100.0)
Platelets: 143 10*3/uL — ABNORMAL LOW (ref 150–400)
RBC: 5.15 MIL/uL (ref 4.22–5.81)
RDW: 13.1 % (ref 11.5–15.5)
WBC: 5.9 10*3/uL (ref 4.0–10.5)
nRBC: 0 % (ref 0.0–0.2)

## 2023-03-22 LAB — MAGNESIUM: Magnesium: 2.1 mg/dL (ref 1.7–2.4)

## 2023-03-22 MED ORDER — ETOMIDATE 2 MG/ML IV SOLN
10.0000 mg | Freq: Once | INTRAVENOUS | Status: AC
  Administered 2023-03-22: 10 mg via INTRAVENOUS
  Filled 2023-03-22: qty 10

## 2023-03-22 NOTE — ED Triage Notes (Signed)
Pt c/o heart racing and thinks he's in "flutter" since about 0230; has taken diltiazem x 2 since

## 2023-03-22 NOTE — Discharge Instructions (Signed)
You were seen in the emergency department for your heart palpitations.  You were and a rapid A-fib in the emergency department.  We did perform a cardioversion and you are now back in a normal sinus rhythm.  Your labs were within normal range.  You should continue to take your regular medications as prescribed and you should follow-up with your cardiologist or in the A-fib clinic in the next few days to have your symptoms rechecked.  You should return to the emergency department if you have recurrent episodes of your heart racing that does not respond to your home medication, you have severe chest pain, you pass out or if you have any other new or concerning symptoms.

## 2023-03-22 NOTE — ED Notes (Signed)
Patient was placed on 2L for procedure. ETCO2 readings 30-38 throughout procedure. Patient currently on RA with O2 sats of 97-98%. BVM and suction was available at bedside during procedure.

## 2023-03-22 NOTE — ED Provider Notes (Signed)
[facility] Provider Note   CSN: [id number] Arrival date & time: 03/22/23  1154     History  Chief Complaint  Patient presents with   Palpitations    Carlos Thomas is a 72 y.o. male.  Patient is a 72 year old male with a past medical history of A-fib on Xarelto and hypertension presenting to the emergency department with palpitations.  The patient states that he woke up around 2 AM feeling like his heart was racing.  Patient reports that he took one of his short acting diltiazem without any improvement and repeated the dose at 5 AM and 9 AM again without any change.  He states he has associated fatigue but denies any chest pain, shortness of breath, nausea, vomiting or diarrhea.  He states he has been taking all of his medications as prescribed including his Xarelto.  The patient states that he has been under increased amount of stress recently which tends to trigger his A-fib.  The history is provided by the patient and the spouse.  Palpitations      Home Medications Prior to Admission medications   Medication Sig Start Date End Date Taking? Authorizing Provider  clotrimazole-betamethasone (LOTRISONE) cream Apply 1 Application topically as needed.    [provider]  Cyanocobalamin (VITAMIN B-12) 2500 MCG SUBL Take 2,500 mcg by mouth daily.    [provider]  diltiazem (CARDIZEM CD) 120 MG 24 hr capsule Take 1 capsule (120 mg total) by mouth 2 (two) times daily. 02/10/23   Eustace Pen, PA-C  diltiazem (CARDIZEM) 30 MG tablet TAKE 1 TABLET EVERY 4 HOURS AS NEEDED A-FIB, HEART RATE OVER 100 07/17/22   Newman Nip, NP  flecainide (TAMBOCOR) 50 MG tablet Take 1 tablet (50 mg total) by mouth 2 (two) times daily. 02/06/23   Eustace Pen, PA-C  omeprazole (PRILOSEC) 20 MG capsule Take 20 mg by mouth daily before breakfast.     [provider]  rivaroxaban (XARELTO) 20 MG TABS tablet Take 1 tablet (20 mg  total) by mouth daily with supper. 03/20/23   Eustace Pen, PA-C  tamsulosin (FLOMAX) 0.4 MG CAPS capsule Take 0.4 mg by mouth every 12 (twelve) hours.     [provider]      Allergies    Dextromethorphan    Review of Systems   Review of Systems  Cardiovascular:  Positive for palpitations.    Physical Exam Updated Vital Signs BP 134/71   Pulse (!) 50   Temp 98.7 F (37.1 C) (Oral)   Resp 18   Ht 5\' 11"  (1.803 m)   Wt 106.7 kg   SpO2 97%   BMI 32.81 kg/m  Physical Exam Vitals and nursing note reviewed.  Constitutional:      General: He is not in acute distress.    Appearance: Normal appearance.  HENT:     Head: Normocephalic and atraumatic.     Nose: Nose normal.     Mouth/Throat:     Mouth: Mucous membranes are moist.     Pharynx: Oropharynx is clear.  Eyes:     Extraocular Movements: Extraocular movements intact.     Conjunctiva/sclera: Conjunctivae normal.  Cardiovascular:     Rate and Rhythm: Tachycardia present. Rhythm irregular.     Pulses: Normal pulses.     Heart sounds: Normal heart sounds.  Pulmonary:     Effort: Pulmonary effort is normal.     Breath sounds: Normal breath sounds.  Abdominal:     General: Abdomen is flat.     Palpations: Abdomen is soft.     Tenderness: There is no abdominal tenderness.  Musculoskeletal:        General: Normal range of motion.     Cervical back: Normal range of motion.  Skin:    General: Skin is warm and dry.  Neurological:     General: No focal deficit present.     Mental Status: He is alert and oriented to person, place, and time.  Psychiatric:        Mood and Affect: Mood normal.        Behavior: Behavior normal.     ED Results / Procedures / Treatments   Labs (all labs ordered are listed, but only abnormal results are displayed) Labs Reviewed  BASIC METABOLIC PANEL - Abnormal; Notable for the following components:      Result Value   Glucose, Bld 153 (*)    Calcium 8.8 (*)    All other  components within normal limits  CBC - Abnormal; Notable for the following components:   Platelets 143 (*)    All other components within normal limits  MAGNESIUM    EKG EKG Interpretation Date/Time:  Saturday March 22 2023 12:54:55 EDT Ventricular Rate:  58 PR Interval:  173 QRS Duration:  105 QT Interval:  353 QTC Calculation: 347 R Axis:   121  Text Interpretation: Sinus rhythm Right axis deviation Now in sinus rhythm compared to prior EKG Confirmed by Elayne Snare ([id number]) on 03/22/2023 1:20:00 PM  Radiology No results found.  Procedures .Critical Care  Performed by: Rexford Maus, DO Authorized by: Rexford Maus, DO   Critical care provider statement:    Critical care time (minutes):  40   Critical care time was exclusive of:  Separately billable procedures and treating other patients   Critical care was necessary to treat or prevent imminent or life-threatening deterioration of the following conditions:  Cardiac failure   Critical care was time spent personally by me on the following activities:  Development of treatment plan with patient or surrogate, evaluation of patient's response to treatment, examination of patient, obtaining history from patient or surrogate, ordering and performing treatments and interventions, ordering and review of laboratory studies, ordering and review of radiographic studies, pulse oximetry, re-evaluation of patient's condition and review of old charts   I assumed direction of critical care for this patient from another provider in my specialty: no   .Sedation  Date/Time: 03/22/2023 1:20 PM  Performed by: Rexford Maus, DO Authorized by: Rexford Maus, DO   Consent:    Consent obtained:  Verbal   Consent given by:  Patient   Risks discussed:  Allergic reaction, prolonged hypoxia resulting in organ damage, prolonged sedation necessitating reversal and inadequate sedation   Alternatives discussed:  Analgesia  without sedation and anxiolysis Universal protocol:    Immediately prior to procedure, a time out was called: yes     Patient identity confirmed:  Verbally with patient Indications:    Procedure performed:  Cardioversion   Procedure necessitating sedation performed by:  Different physician Pre-sedation assessment:    Time since last food or drink:  4 hrs   NPO status caution: urgency dictates proceeding with non-ideal NPO status     ASA classification: class 2 - patient with mild systemic disease     Mouth opening:  3 or more finger widths   Thyromental distance:  4 finger widths  Mallampati score:  I - soft palate, uvula, fauces, pillars visible   Neck mobility: normal     Pre-sedation assessments completed and reviewed: pre-procedure airway patency not reviewed, pre-procedure cardiovascular function not reviewed, pre-procedure hydration status not reviewed, pre-procedure mental status not reviewed, pre-procedure nausea and vomiting status not reviewed, pre-procedure pain level not reviewed, pre-procedure respiratory function not reviewed and pre-procedure temperature not reviewed   Immediate pre-procedure details:    Reassessment: Patient reassessed immediately prior to procedure     Reviewed: vital signs, relevant labs/tests and NPO status     Verified: bag valve mask available, emergency equipment available, intubation equipment available, IV patency confirmed, oxygen available, reversal medications available and suction available   Procedure details (see MAR for exact dosages):    Preoxygenation:  Nasal cannula   Sedation:  Etomidate   Intended level of sedation: deep   Analgesia:  None   Intra-procedure monitoring:  Blood pressure monitoring, cardiac monitor, continuous capnometry, continuous pulse oximetry, frequent LOC assessments and frequent vital sign checks   Intra-procedure events: none     Total Provider sedation time (minutes):  10 Post-procedure details:    Attendance:  Constant attendance by certified staff until patient recovered     Recovery: Patient returned to pre-procedure baseline     Post-sedation assessments completed and reviewed: temperature     Post-sedation assessments completed and reviewed: post-procedure airway patency not reviewed, post-procedure cardiovascular function not reviewed, post-procedure hydration status not reviewed, post-procedure mental status not reviewed, post-procedure nausea and vomiting status not reviewed, pain score not reviewed and post-procedure respiratory function not reviewed     Patient is stable for discharge or admission: yes     Procedure completion:  Tolerated well, no immediate complications     Medications Ordered in ED Medications  etomidate (AMIDATE) injection 10 mg (10 mg Intravenous Given 03/22/23 1253)    ED Course/ Medical Decision Making/ A&P Clinical Course as of 03/22/23 1423  Sat Mar 22, 2023  1308 Succcesful cardioversion. Patient back to neurologic baseline and back in sinus rhythm. Will be monitored for 1 hour after cardioversion prior to discharge. [VK]    Clinical Course User Index [VK] Rexford Maus, DO                                 Medical Decision Making This patient presents to the ED with chief complaint(s) of palpations with pertinent past medical history of A-fib, hypertension which further complicates the presenting complaint. The complaint involves an extensive differential diagnosis and also carries with it a high risk of complications and morbidity.    The differential diagnosis includes arrhythmia, anemia, dehydration, electrolyte abnormality  Additional history obtained: Additional history obtained from family Records reviewed cardiology records  ED Course and Reassessment: On patient's arrival he is tachycardic and irregular rhythm and otherwise hemodynamically stable in no acute distress.  The patient EKG on arrival that did show likely atrial flutter with rapid  ventricular response.  The patient's heart rate remained between the 1 teens to 150s on the monitor.  He was recommended cardioversion.  His symptoms started at 2 AM this morning and he has been compliant with his blood thinners.  Patient was agreeable with plan and supplies will be set up at bedside.  He will have labs including electrolytes to evaluate for potential causes of his A-fib with RVR and will be closely reassessed.  Independent labs interpretation:  The following  labs were independently interpreted: Within normal range  Independent visualization of imaging: -N/A  Consultation: - Consulted or discussed management/test interpretation w/ external professional: N/A  Consideration for admission or further workup: Patient has no emergent conditions requiring admission or further work-up at this time and is stable for discharge home with cardiology follow-up  Social Determinants of health: N/A    Amount and/or Complexity of Data Reviewed Labs: ordered. ECG/medicine tests: ordered.  Risk Prescription drug management.          Final Clinical Impression(s) / ED Diagnoses Final diagnoses:  Atrial fibrillation with rapid ventricular response [facility])    Rx / DC Orders ED Discharge Orders          Ordered    Amb referral to [facility]        03/22/23 1225              Rexford Maus, DO 03/22/23 1423

## 2023-03-22 NOTE — ED Provider Notes (Signed)
.  Cardioversion  Date/Time: 03/22/2023 12:57 PM  Performed by: Netta Corrigan, PA-C Authorized by: Netta Corrigan, PA-C   Consent:    Consent obtained:  Verbal   Consent given by:  Patient   Risks discussed:  Cutaneous burn, death, induced arrhythmia and pain   Alternatives discussed:  No treatment Pre-procedure details:    Cardioversion basis:  Emergent   Rhythm:  Atrial fibrillation   Electrode placement:  Anterior-lateral Patient sedated: Yes. Refer to sedation procedure documentation for details of sedation.  Attempt one:    Cardioversion mode:  Synchronous   Waveform:  Monophasic   Shock (Joules):  200   Shock outcome:  Conversion to normal sinus rhythm Post-procedure details:    Patient status:  Awake   Patient tolerance of procedure:  Tolerated well, no immediate complications     Carlos Thomas 03/22/23 1258    Elayne Snare K, DO 03/22/23 1320

## 2023-03-25 ENCOUNTER — Ambulatory Visit (HOSPITAL_COMMUNITY)
Admission: RE | Admit: 2023-03-25 | Discharge: 2023-03-25 | Disposition: A | Discharge status: Home / Self Care | Payer: Medicare PPO | Source: Ambulatory Visit | Attending: Internal Medicine | Admitting: Internal Medicine

## 2023-03-25 VITALS — BP 150/62 | HR 55 | Ht 71.0 in | Wt 233.2 lb

## 2023-03-25 DIAGNOSIS — R9431 Abnormal electrocardiogram [ECG] [EKG]: Secondary | ICD-10-CM | POA: Insufficient documentation

## 2023-03-25 DIAGNOSIS — Z7901 Long term (current) use of anticoagulants: Secondary | ICD-10-CM | POA: Insufficient documentation

## 2023-03-25 DIAGNOSIS — I4892 Unspecified atrial flutter: Secondary | ICD-10-CM | POA: Diagnosis not present

## 2023-03-25 DIAGNOSIS — I10 Essential (primary) hypertension: Secondary | ICD-10-CM | POA: Insufficient documentation

## 2023-03-25 DIAGNOSIS — I480 Paroxysmal atrial fibrillation: Secondary | ICD-10-CM | POA: Diagnosis not present

## 2023-03-25 DIAGNOSIS — I4891 Unspecified atrial fibrillation: Secondary | ICD-10-CM | POA: Diagnosis present

## 2023-03-25 DIAGNOSIS — G4733 Obstructive sleep apnea (adult) (pediatric): Secondary | ICD-10-CM | POA: Diagnosis not present

## 2023-03-25 MED ORDER — FLECAINIDE ACETATE 100 MG PO TABS: 100.0000 mg | ORAL_TABLET | Freq: Two times a day (BID) | ORAL | 3 refills | Status: DC

## 2023-03-25 NOTE — Progress Notes (Signed)
Primary Care Physician: Noberto Retort, MD Referring Physician: [facility] ER f/u EP: Dr. Nelly Laurence (formerly Dr. Johney Frame)    Carlos Thomas is a 72 y.o. male with a h/o afib s/p w ablations, 2010 and 2013 that intermittently requires cardioversion, on flecainide. Pt was involved in a traffic situation that escalated his HR on Friday and immediately felt his heart go into afib. He thought he took an extra flecainide but by mistake, he took a nausea pill. He went to the  ER on Saturday but the ER doc did not want to cardiovert as he was rate controlled. He returned on Sunday to the ER with afib around 120 bpm and received successful  cardioversion.  On last visit in April 2019, he continued to maintain S brady at 50 bpm. Continues on xarelto 20 mg daily. He was given an extra 120 mg diltiazem to add to his 240 mg daily Cardizem in the ER.  F/u afib clinic, 12/6. He is in SR and has not had any afib but has neuropathy and his neurologist said that flecainide may be worsening his symptoms. He says that he has had neuropathy  for awhile and the symptoms are stable. He does not want to stop flecainide but wanted to know our experience with this.   F/u in afib clinic, 09/28/19. I have not seen pt since 2019. He decided to stay on the flecainide after his last visit as  we discussed staying on flecainide despite it may be worsening his peripheral neuropathy as mentioned by his neurologist. He states that it has stayed about the same and he prefers to continue its use. He has not noted any afib. EKG shows sinus brady at 46 bpm, LAFB.   F/u in afib clinic, 5/27. He had a very busy day yesterday, doing multiple activities outside around a lot of pollen. He coughed  hard last pm and wento out of rhythm. He took his Cardizem and his flecainide and when he was still out of rhythm a couple hours later, he went to [facility]. After unsuccessfully  trying to convert with IV Cardizem, he was successfully cardioverted.  He did have some  issues with hypotension and bradycardia afterward. He did not leave there until 7:30 this am. Now his BP is normal and he remains  in sinus brady at 53 bpm. He is very tired for being up all night.   F/u in afib clinic, 12/01/19. He had another cardioversion 5/29 and is quite upset that he has had 2 episodes in the last few days. We discussed that he did not go to have an urgent cardioversion if he was stable. He does come out of cardioversion bradycardic and with low BP with an extra 120 mg Cardizem po  or at the time of his first DCCV, Cardizem was used first IV. We did discuss that he could use 30 mg Cardizem that may convert without going to the  ER and not drop his BP/HR as much. He has himself gone back up to 120 mg bid of Cardizem but have asked him not to increase further as he had HR's in the 40's with higher doses of CCB. Now HR mid 50's. He had an atrial flutter ablation in 2010 and an afib ablation in 2013 and may be time to see Dr. Johney Frame  for an repeat afib ablation as he has had a very long time in SR. He will need updated echo. The other option would be to change  antiarrythmic to Tikosyn.  F/u in afib ablation 02/29/20.He is now one month s/p ablation. He only has noted one episode afib and that was drinking regular coffee given to him in a restaurant when he had asked for decaf. He took an extra cardizem 30 mg and it calmed down quickly. He continues on flecainide/cardizem as well as xarelto 20 mg daily for a CHA2DS2VASc dcore of at least 2.   F/u in afib clinic, 5/10, after cardioversion in the ER for atrial flutter at 150 bpm. He presented there after flecainde x 2 and prn cardizem did not touch the 150 bpm rate.  It was successful and he remains in SR. Feels that family stress trigger this episode. No further arrhythmia since CV. He has noted a few elevated  BP's in the pm around 150 systolic. Encouraged to keep readings to review with Dr. Johney Frame in June. 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 Johney Frame.   F/u in afib clinic, 07/17/22, to f/u Dr. Jenel Lucks last appointment  December 2022.  He states he has not noted any afib. Off flecainide  for some time. BP mildly elevated in office, at home stable. EKG shows sinus brady at 53 bpm. Will call and get recent lab results form PCP.   F/u in Afib clinic, 02/03/23. He is s/p ED evaluation due to atrial flutter on 01/13/23 s/p successful DCCV. He is again s/p ED evaluation on 8/5 due to Afib with RVR s/p successful DCCV. He is currently in NSR. He is interested in medication management to avoid the ER. He has not missed any doses of Xarelto.  F/u in Afib clinic, 03/25/23. He is currently in NSR. He is s/p ED visit on 03/22/23 for atrial flutter with RVR. He underwent successful DCCV in the ED. He is currently taking flecainide 50 mg BID and reports to be compliant. Patient tells me specifically he has been under a lot of stress due to daughter being behind paying child support ~30-40k. The night of ED visit he found out selling his two collectible cars would not amount to the needed total. No missed doses of Xarelto.   Today, he denies symptoms of palpitations, chest pain, shortness of breath, orthopnea, PND, lower extremity edema, dizziness, presyncope, syncope, or neurologic sequela. The patient is tolerating medications without difficulties and is otherwise without complaint today.   Past Medical History:  Diagnosis Date   Anticoagulant long-term use    xarelto   Balanitis    GERD (gastroesophageal reflux disease)    History of adenomatous polyp of colon    2006  tubular adenoma's and hyperplastic polyp's;   2011  tubular adenoma   History of atrial flutter    s/p ablation atrial flutter/atrial fib in 2010   History of MRSA infection    1990's  infected wound near surgerical area    History of repair of hiatal hernia    HTN (hypertension)     Obesity (BMI 30-39.9)    OSA on CPAP    Mild OSA  per study 05-07-2012-- w AHI 6.57/hr now on 13cm H2O   PAF (paroxysmal atrial fibrillation) [facility]) primary cardiologist-  dr Anne Fu   ep cardiologist-  dr Johney Frame---  s/p  ablation afib and aflutter 2010  and  ablation afib/svt  2013   Peripheral neuropathy, hereditary/idiopathic    bilateral feet---  neurologist- dr patel   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[place], 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 , fireman   S/P radiofrequency ablation operation for arrhythmia    01-09-2009  cardiac ablation afib/flutter;   09-13-2011  ablation afib/svt and in ther right atrium   Wears glasses    Past Surgical History:  Procedure Laterality Date   ATRIAL FIBRILLATION ABLATION N/A 09/13/2011   Procedure: ATRIAL FIBRILLATION ABLATION;  Surgeon: Hillis Range, MD;  Location: [facility] CATH LAB;  Service: Cardiovascular;  Laterality: N/A;   ATRIAL FIBRILLATION ABLATION N/A 02/01/2020   Procedure: ATRIAL FIBRILLATION ABLATION;  Surgeon: Hillis Range, MD;  Location: [facility] INVASIVE CV LAB;  Service: Cardiovascular;  Laterality: N/A;   CARDIAC ELECTROPHYSIOLOGY MAPPING AND ABLATION  [phone]   dr allred   successful ablation atrial fibrillation /  atrial flutter   CARDIAC ELECTROPHYSIOLOGY MAPPING AND ABLATION  09-13-2011   dr Johney Frame   afib mapping/ ablation and  mapping/ablation in the right atrium   CARDIOVASCULAR STRESS TEST  09/2006   per dr Anne Fu note nuclear study normal w/ no ischemia   CARDIOVERSION N/A 09/21/2015   Procedure: CARDIOVERSION;  Surgeon: Lewayne Bunting, MD;  Location: [facility] ENDOSCOPY;  Service: Cardiovascular;  Laterality: N/A;   CIRCUMCISION N/A 06/27/2016   Procedure: CIRCUMCISION ADULT;  Surgeon: Jethro Bolus, MD;  Location: [facility];  Service: Urology;  Laterality: N/A;   COLONOSCOPY  last one 02/  2011   DIRECT CURRENT CARDIOVERSION  02/02/2010   dr [name]   LAPAROSCOPIC CHOLECYSTECTOMY  1990's   OPEN HIATAL HERNIA REPAIR  1970's   TEE  WITHOUT CARDIOVERSION  09/12/2011   Procedure: TRANSESOPHAGEAL ECHOCARDIOGRAM (TEE);  Surgeon: Corky Crafts, MD;  Location: [facility] ENDOSCOPY;  Service: Cardiovascular;  Laterality: N/A;  trace AR, no LA/LAA thrombus, normal LVfunction, moderate descending aortic atherosclerosis   TEE WITHOUT CARDIOVERSION N/A 09/21/2015   Procedure: TRANSESOPHAGEAL ECHOCARDIOGRAM (TEE);  Surgeon: Lewayne Bunting, MD;  Location: [facility] ENDOSCOPY;  Service: Cardiovascular;  Laterality: N/A; normal LV systolic funciton,  moderate LAE,  no LAA thrombus,  trace MR and TR   TRANSTHORACIC ECHOCARDIOGRAM  06/03/2014   ef 55-60%/  trivial MR and TR    Current Outpatient Medications  Medication Sig Dispense Refill   clotrimazole-betamethasone (LOTRISONE) cream Apply 1 Application topically as needed.     Cyanocobalamin (VITAMIN B-12) 2500 MCG SUBL Take 2,500 mcg by mouth daily.     diltiazem (CARDIZEM CD) 120 MG 24 hr capsule Take 1 capsule (120 mg total) by mouth 2 (two) times daily. 180 capsule 1   diltiazem (CARDIZEM) 30 MG tablet TAKE 1 TABLET EVERY 4 HOURS AS NEEDED A-FIB, HEART RATE OVER 100 45 tablet 1   omeprazole (PRILOSEC) 20 MG capsule Take 20 mg by mouth daily before breakfast.      rivaroxaban (XARELTO) 20 MG TABS tablet Take 1 tablet (20 mg total) by mouth daily with supper. 90 tablet 1   tamsulosin (FLOMAX) 0.4 MG CAPS capsule Take 0.4 mg by mouth every 12 (twelve) hours.      flecainide (TAMBOCOR) 100 MG tablet Take 1 tablet (100 mg total) by mouth 2 (two) times daily. 60 tablet 3   No current facility-administered medications for this encounter.    Allergies  Allergen Reactions   Dextromethorphan Other (See Comments)    Triggers afib/hypertension   ROS- All systems are reviewed and negative except as per the HPI above  Physical Exam: Vitals:   03/25/23 1425  BP: (!) 150/62  Pulse: (!) 55  Weight: 105.8 kg  Height: 5\' 11"  (1.803 m)  Wt Readings from Last 3 Encounters:  03/25/23 105.8 kg   03/22/23 106.7 kg  02/06/23 106.7 kg    Labs: Lab Results  Component Value Date   NA 137 03/22/2023   K 3.9 03/22/2023   CL 104 03/22/2023   CO2 23 03/22/2023   GLUCOSE 153 (H) 03/22/2023   BUN 18 03/22/2023   CREATININE 0.99 03/22/2023   CALCIUM 8.8 (L) 03/22/2023   MG 2.1 03/22/2023   Lab Results  Component Value Date   INR 1.9 (H) 09/09/2022   No results found for: "CHOL", "HDL", "LDLCALC", "TRIG"  GEN- The patient is well appearing, alert and oriented x 3 today.   Neck - no JVD or carotid bruit noted Lungs- Clear to ausculation bilaterally, normal work of breathing Heart- Regular rate and rhythm, no murmurs, rubs or gallops, PMI not laterally displaced Extremities- no clubbing, cyanosis, or edema Skin - no rash or ecchymosis noted   EKG-   Vent. rate 55 BPM PR interval 180 ms QRS duration 90 ms QT/QTcB 416/397 ms P-R-T axes 62 117 56 Sinus bradycardia with sinus arrhythmia Left posterior fascicular block Abnormal ECG When compared with ECG of 22-Mar-2023 12:54, PREVIOUS ECG IS PRESENT  ECHO 12/08/19:  1. Left ventricular ejection fraction, by estimation, is 55 to 60%. The  left ventricle has normal function. The left ventricle has no regional  wall motion abnormalities. Left ventricular diastolic parameters were  normal.   2. Right ventricular systolic function is normal. The right ventricular  size is normal.   3. The mitral valve is normal in structure. Trivial mitral valve  regurgitation. No evidence of mitral stenosis.   4. The aortic valve is tricuspid. Aortic valve regurgitation is not  visualized. No aortic stenosis is present.   5. Aortic dilatation noted. There is mild dilatation at the level of the  sinuses of Valsalva measuring 38 mm.   6. The inferior vena cava is normal in size with greater than 50%  respiratory variability, suggesting right atrial pressure of 3 mmHg.    Assessment and Plan: 1. Paroxysmal afib/flutter  Staying in  SR Successful ER CV, 10/31/20,  for aflutter at 150 bpm, refractory to meds (prn flecainde and prn cardizem)  He is s/p 3rd ablation 02/01/20 S/p DCCV on 01/13/23 (in the ED) S/p DCCV on 02/03/23 (in the ED) S/p DCCV on 03/22/23 (in the ED)  He is currently in NSR.  After discussion, we will increase flecainide to 100 mg BID to lower burden. He notes stress, alcohol, and caffeine to be his triggers for Afib. He would like to do this medication adjustment first versus seeing Dr. Nelly Laurence sooner to consider repeat ablation. He notes his girlfriend helped with daughter's child support and has considerably improved his stress. Increase flecainide and plan to repeat ECG in 7-10 days.   Continue diltiazem at 120 mg bid .   2.  Chadsvasc score of at least  2  Continue xarelto 20 mg daily    3. OSA Continue CPAP  4. HTN Stable at home    Repeat ECG in 7-10 days.    Lake Bells, PA-C Afib Clinic [facility] [address] [place], [place] [address] [phone]

## 2023-03-25 NOTE — Patient Instructions (Signed)
Increase flecainide to 100mg  twice a day

## 2023-04-02 ENCOUNTER — Ambulatory Visit (HOSPITAL_COMMUNITY)
Admission: RE | Admit: 2023-04-02 | Discharge: 2023-04-02 | Disposition: A | Discharge status: Home / Self Care | Payer: Medicare PPO | Source: Ambulatory Visit | Attending: Internal Medicine | Admitting: Internal Medicine

## 2023-04-02 VITALS — HR 55

## 2023-04-02 DIAGNOSIS — I491 Atrial premature depolarization: Secondary | ICD-10-CM | POA: Diagnosis not present

## 2023-04-02 DIAGNOSIS — I480 Paroxysmal atrial fibrillation: Secondary | ICD-10-CM

## 2023-04-02 DIAGNOSIS — I445 Left posterior fascicular block: Secondary | ICD-10-CM | POA: Diagnosis not present

## 2023-04-02 DIAGNOSIS — R001 Bradycardia, unspecified: Secondary | ICD-10-CM | POA: Diagnosis not present

## 2023-04-02 DIAGNOSIS — Z79899 Other long term (current) drug therapy: Secondary | ICD-10-CM | POA: Diagnosis not present

## 2023-04-02 DIAGNOSIS — R008 Other abnormalities of heart beat: Secondary | ICD-10-CM | POA: Insufficient documentation

## 2023-04-02 NOTE — Progress Notes (Signed)
Patient is here today for repeat ECG due to flecainide increase to 100 mg BID.   ECG today shows Vent. rate 55 BPM PR interval 182 ms QRS duration 98 ms QT/QTcB 400/382 ms P-R-T axes 53 120 35 Sinus bradycardia with Premature atrial complexes in a pattern of bigeminy Left posterior fascicular block Abnormal ECG When compared with ECG of 25-Mar-2023 14:32, PREVIOUS ECG IS PRESENT  Pt will follow up Afib clinic in 3 months. Continue flecainide 100 mg BID.

## 2023-04-08 ENCOUNTER — Ambulatory Visit: Payer: Medicare PPO | Attending: Cardiology | Admitting: Cardiology

## 2023-04-08 ENCOUNTER — Encounter: Payer: Self-pay | Admitting: Cardiology

## 2023-04-08 VITALS — BP 146/64 | HR 54 | Ht 71.0 in | Wt 232.2 lb

## 2023-04-08 DIAGNOSIS — I10 Essential (primary) hypertension: Secondary | ICD-10-CM

## 2023-04-08 DIAGNOSIS — R931 Abnormal findings on diagnostic imaging of heart and coronary circulation: Secondary | ICD-10-CM | POA: Diagnosis not present

## 2023-04-08 DIAGNOSIS — I480 Paroxysmal atrial fibrillation: Secondary | ICD-10-CM

## 2023-04-08 NOTE — Patient Instructions (Signed)
Medication Instructions:  The current medical regimen is effective;  continue present plan and medications.  *If you need a refill on your cardiac medications before your next appointment, please call your pharmacy*   Follow-Up: At [facility], you and your health needs are our priority.  As part of our continuing mission to provide you with exceptional heart care, we have created designated Provider Care Teams.  These Care Teams include your primary Cardiologist (physician) and Advanced Practice Providers (APPs -  Physician Assistants and Nurse Practitioners) who all work together to provide you with the care you need, when you need it.  We recommend signing up for the patient portal called "MyChart".  Sign up information is provided on this After Visit Summary.  MyChart is used to connect with patients for Virtual Visits (Telemedicine).  Patients are able to view lab/test results, encounter notes, upcoming appointments, etc.  Non-urgent messages can be sent to your provider as well.   To learn more about what you can do with MyChart, go to [email or web address].    Your next appointment:   6 month(s)  Provider:   You will follow up in the Atrial Fibrillation Clinic located at [facility]. Your provider will be: Lake Bells, PA-C

## 2023-04-08 NOTE — Progress Notes (Signed)
  Cardiology Office Note:  .   Date:  04/08/2023  ID:  Carlos Thomas, DOB 08/13/1950, MRN [id number] PCP: Noberto Retort, MD  [facility] Providers Cardiologist:  Donato Schultz, MD Electrophysiologist:  Maurice Small, MD  Sleep Medicine:  Armanda Magic, MD     History of Present Illness: .   Carlos Thomas is a 72 y.o. male Discussed with the use of AI scribe   History of Present Illness    Carlos Thomas, with a history of atrial fibrillation managed with ablations in 2010 and 2013, presents for follow-up. He has required intermittent cardioversions and was last seen in sinus rhythm at the atrial fibrillation clinic. He had an ER visit on 03/22/23 for atrial flutter with RVR, which was successfully managed with cardioversion. At that time, he was compliant with a 50mg  dose of Flecainide and reported significant stress due to familial issues.  He is also on a regimen of Diltiazem 30mg  PRN and Diltiazem 120mg  twice daily. His echocardiogram in 2021 showed a normal ejection fraction of 60%. His last EKG on 04/02/23 showed sinus bradycardia with PACs.        Studies Reviewed: .       Results LABS LDL: 60 mg/dL V2Z: 3.6% (64/4034)  DIAGNOSTIC ECG: Sinus rhythm  Risk Assessment/Calculations:           Physical Exam:   VS:  BP (!) 146/64   Pulse (!) 54   Ht 5\' 11"  (1.803 m)   Wt 232 lb 3.2 oz (105.3 kg)   SpO2 96%   BMI 32.39 kg/m    Wt Readings from Last 3 Encounters:  04/08/23 232 lb 3.2 oz (105.3 kg)  03/25/23 233 lb 3.2 oz (105.8 kg)  03/22/23 235 lb 3.7 oz (106.7 kg)    GEN: Well nourished, well developed in no acute distress NECK: No JVD; No carotid bruits CARDIAC: RRR, no murmurs, no rubs, no gallops RESPIRATORY:  Clear to auscultation without rales, wheezing or rhonchi  ABDOMEN: Soft, non-tender, non-distended EXTREMITIES:  No edema; No deformity   ASSESSMENT AND PLAN: .    Assessment and Plan    Atrial Fibrillation History of ablations in  2010 and 2013, intermittent need for cardioversion. Recent ER visit on 03/22/2023 for atrial flutter with RVR, successfully cardioverted. Last EKG on 04/02/2023 showed sinus bradycardia with PACs. Currently on Flecainide 100mg  BID and Diltiazem 120mg  BID with PRN 30mg . Normal EF (60%) on 2021 echocardiogram. -Continue current medication regimen. -Consider referral to EP for possible renal fibrillation if medication adjustment does not lower overall burden.            Signed, Donato Schultz, MD

## 2023-05-05 DIAGNOSIS — R3915 Urgency of urination: Secondary | ICD-10-CM | POA: Diagnosis not present

## 2023-05-05 DIAGNOSIS — N401 Enlarged prostate with lower urinary tract symptoms: Secondary | ICD-10-CM | POA: Diagnosis not present

## 2023-05-26 ENCOUNTER — Encounter: Payer: Self-pay | Admitting: Cardiovascular Disease

## 2023-05-26 ENCOUNTER — Ambulatory Visit: Payer: Medicare PPO | Attending: Cardiovascular Disease | Admitting: Cardiovascular Disease

## 2023-05-26 VITALS — BP 132/70 | HR 48 | Ht 71.0 in | Wt 234.4 lb

## 2023-05-26 DIAGNOSIS — I480 Paroxysmal atrial fibrillation: Secondary | ICD-10-CM | POA: Diagnosis not present

## 2023-05-26 DIAGNOSIS — I4819 Other persistent atrial fibrillation: Secondary | ICD-10-CM

## 2023-05-26 DIAGNOSIS — I484 Atypical atrial flutter: Secondary | ICD-10-CM

## 2023-05-26 NOTE — Progress Notes (Signed)
Electrophysiology Office Note:    Date:  05/26/2023   ID:  Carlos Thomas, DOB May 15, 1951, MRN [id number]  PCP:  Noberto Retort, MD   [facility] Providers Cardiologist:  Donato Schultz, MD Electrophysiologist:  Maurice Small, MD  Sleep Medicine:  Armanda Magic, MD     Referring MD: Noberto Retort, MD   History of Present Illness:    Carlos Thomas is a 72 y.o. male with a hx listed below, significant for atrial fibrillation and flutter status post ablations in 2010, 2013, 2021 who presents for follow-up.  He is a former patient of Dr. Johney Frame.  At the time of the last ablation, in 2021, all 4 pulmonary veins remained quiescent.  A posterior box lesion set was created.  He has been doing well. He has required 2 cardioversions since the last ablation.    Since his last visit with me he had recurrence with an atypical flutter vs ATand underwent cardioversions in the ER in July, August, and September.  His flecainide was resumed and increased to 100 mg twice daily.     EKGs/Labs/Other Studies Reviewed Today:    Echocardiogram:  12/08/2019 EF 55-60%   Monitors:  06/26/2021 Sinus rhythm, rare ectopy. No AF  Stress testing:   Advanced imaging:   Cardiac catherization   EKG:  Last EKG results: sinus bradycardia   Recent Labs: 09/09/2022: TSH 1.148 01/13/2023: ALT 27 03/22/2023: BUN 18; Creatinine, Ser 0.99; Hemoglobin 16.8; Magnesium 2.1; Platelets 143; Potassium 3.9; Sodium 137     Physical Exam:    VS:  BP 132/70 (BP Location: Left Arm, Patient Position: Sitting, Cuff Size: Large)   Pulse (!) 48   Ht 5\' 11"  (1.803 m)   Wt 234 lb 6.4 oz (106.3 kg)   SpO2 95%   BMI 32.69 kg/m     Wt Readings from Last 3 Encounters:  05/26/23 234 lb 6.4 oz (106.3 kg)  04/08/23 232 lb 3.2 oz (105.3 kg)  03/25/23 233 lb 3.2 oz (105.8 kg)     GEN: Well nourished, well developed in no acute distress CARDIAC: RRR, no murmurs, rubs, gallops RESPIRATORY:   Normal work of breathing MUSCULOSKELETAL: no edema    ASSESSMENT & PLAN:    Atrial fibrillation - persistent - and atypical atrial flutter Now with increasing occurrences of atypical flutter Possibly AT, but this is less likely. He is on flecainide 100mg  BID  I would rather have him off flecainide with sinus bradycardia and LPFB We discussed the indication for mapping and ablation of atypical flutter.  He would like to proceed  We discussed the indication, rationale, logistics, anticipated benefits, and potential risks of the ablation procedure including but not limited to -- bleed at the groin access site, chest pain, damage to nearby organs such as the diaphragm, lungs, or esophagus, need for a drainage tube, or prolonged hospitalization. I explained that the risk for stroke, heart attack, need for open chest surgery, or even death is very low but not zero. he  expressed understanding and wishes to proceed.   Obesity We discussed the importance of weight loss for maintenance of sinus rhythm  OSA I encouraged ongoing use of CPAP        Medication Adjustments/Labs and Tests Ordered: Current medicines are reviewed at length with the patient today.  Concerns regarding medicines are outlined above.  Orders Placed This Encounter  Procedures   EKG 12-Lead   No orders of the defined types were placed in this  encounter.    Signed, Maurice Small, MD  05/26/2023 2:05 PM    [facility]

## 2023-05-26 NOTE — Patient Instructions (Signed)
Medication Instructions:  Your physician recommends that you continue on your current medications as directed. Please refer to the Current Medication list given to you today. *If you need a refill on your cardiac medications before your next appointment, please call your pharmacy*   Testing/Procedures: Atrial Flutter Ablation - We will contact you to set this up once [facility] April 2025 hospital schedule is available Your physician has recommended that you have an ablation. Catheter ablation is a medical procedure used to treat some cardiac arrhythmias (irregular heartbeats). During catheter ablation, a long, thin, flexible tube is put into a blood vessel in your groin (upper thigh), or neck. This tube is called an ablation catheter. It is then guided to your heart through the blood vessel. Radio frequency waves destroy small areas of heart tissue where abnormal heartbeats may cause an arrhythmia to start. Please see the instruction sheet given to you today.   Follow-Up: At [facility], you and your health needs are our priority.  As part of our continuing mission to provide you with exceptional heart care, we have created designated Provider Care Teams.  These Care Teams include your primary Cardiologist (physician) and Advanced Practice Providers (APPs -  Physician Assistants and Nurse Practitioners) who all work together to provide you with the care you need, when you need it.  We recommend signing up for the patient portal called "MyChart".  Sign up information is provided on this After Visit Summary.  MyChart is used to connect with patients for Virtual Visits (Telemedicine).  Patients are able to view lab/test results, encounter notes, upcoming appointments, etc.  Non-urgent messages can be sent to your provider as well.   To learn more about what you can do with MyChart, go to [email or web address].    Your next appointment:   We will contact you to set up a follow up  appointment prior to ablation   Provider:   York Pellant, MD

## 2023-06-20 ENCOUNTER — Telehealth: Payer: Self-pay

## 2023-06-20 ENCOUNTER — Other Ambulatory Visit: Payer: Self-pay

## 2023-06-20 DIAGNOSIS — I484 Atypical atrial flutter: Secondary | ICD-10-CM

## 2023-06-20 NOTE — Telephone Encounter (Signed)
Spoke with patient, ablation scheduled for 07/22/23 at 1000 with Dr Nelly Laurence, labs to be completed on 07/03/23. No needs at this time

## 2023-07-07 DIAGNOSIS — I484 Atypical atrial flutter: Secondary | ICD-10-CM | POA: Diagnosis not present

## 2023-07-08 LAB — CBC
Hematocrit: 45.1 % (ref 37.5–51.0)
Hemoglobin: 15.1 g/dL (ref 13.0–17.7)
MCH: 32.3 pg (ref 26.6–33.0)
MCHC: 33.5 g/dL (ref 31.5–35.7)
MCV: 96 fL (ref 79–97)
Platelets: 144 10*3/uL — ABNORMAL LOW (ref 150–450)
RBC: 4.68 x10E6/uL (ref 4.14–5.80)
RDW: 12.6 % (ref 11.6–15.4)
WBC: 4.9 10*3/uL (ref 3.4–10.8)

## 2023-07-08 LAB — BASIC METABOLIC PANEL
BUN/Creatinine Ratio: 15 (ref 10–24)
BUN: 17 mg/dL (ref 8–27)
CO2: 26 mmol/L (ref 20–29)
Calcium: 9.2 mg/dL (ref 8.6–10.2)
Chloride: 105 mmol/L (ref 96–106)
Creatinine, Ser: 1.15 mg/dL (ref 0.76–1.27)
Glucose: 131 mg/dL — ABNORMAL HIGH (ref 70–99)
Potassium: 5 mmol/L (ref 3.5–5.2)
Sodium: 144 mmol/L (ref 134–144)
eGFR: 67 mL/min/{1.73_m2} (ref >59)

## 2023-07-09 ENCOUNTER — Telehealth (HOSPITAL_COMMUNITY): Payer: Self-pay | Admitting: *Deleted

## 2023-07-09 ENCOUNTER — Ambulatory Visit (HOSPITAL_COMMUNITY): Payer: Medicare PPO | Admitting: Internal Medicine

## 2023-07-09 NOTE — Telephone Encounter (Signed)
 Reaching out to patient to offer assistance regarding upcoming cardiac imaging study; pt verbalizes understanding of appt date/time, parking situation and where to check in, pre-test NPO status and medications ordered, and verified current allergies; name and call back number provided for further questions should they arise Carlos Seats RN Navigator Cardiac Imaging [facility] [phone] office [phone] cell

## 2023-07-10 ENCOUNTER — Ambulatory Visit (HOSPITAL_COMMUNITY)
Admission: RE | Admit: 2023-07-10 | Discharge: 2023-07-10 | Disposition: A | Discharge status: Home / Self Care | Payer: Medicare PPO | Source: Ambulatory Visit | Attending: Cardiovascular Disease | Admitting: Cardiovascular Disease

## 2023-07-10 DIAGNOSIS — I4891 Unspecified atrial fibrillation: Secondary | ICD-10-CM | POA: Diagnosis not present

## 2023-07-10 DIAGNOSIS — I484 Atypical atrial flutter: Secondary | ICD-10-CM | POA: Diagnosis not present

## 2023-07-10 MED ORDER — IOHEXOL 350 MG/ML SOLN
100.0000 mL | Freq: Once | INTRAVENOUS | Status: AC | PRN
  Administered 2023-07-10: 100 mL via INTRAVENOUS

## 2023-07-21 NOTE — Pre-Procedure Instructions (Signed)
Instructed patient on the following items: Arrival time 0800 Nothing to eat or drink after midnight No meds AM of procedure Responsible person to drive you home and stay with you for 24 hrs  Have you missed any doses of anti-coagulant Xarelto- takes once a day, hasn't missed any doses

## 2023-07-22 ENCOUNTER — Ambulatory Visit (HOSPITAL_COMMUNITY)
Admission: RE | Admit: 2023-07-22 | Discharge: 2023-07-22 | Disposition: A | Discharge status: Home / Self Care | Payer: Medicare PPO | Attending: Cardiovascular Disease | Admitting: Cardiovascular Disease

## 2023-07-22 ENCOUNTER — Other Ambulatory Visit: Payer: Self-pay

## 2023-07-22 ENCOUNTER — Encounter (HOSPITAL_COMMUNITY): Payer: Self-pay | Admitting: Cardiovascular Disease

## 2023-07-22 ENCOUNTER — Ambulatory Visit (HOSPITAL_BASED_OUTPATIENT_CLINIC_OR_DEPARTMENT_OTHER): Payer: Medicare PPO

## 2023-07-22 ENCOUNTER — Ambulatory Visit (HOSPITAL_COMMUNITY)
Admission: RE | Disposition: A | Discharge status: Home / Self Care | Payer: Self-pay | Source: Home / Self Care | Attending: Cardiovascular Disease

## 2023-07-22 ENCOUNTER — Ambulatory Visit (HOSPITAL_COMMUNITY): Payer: Medicare PPO

## 2023-07-22 DIAGNOSIS — I484 Atypical atrial flutter: Secondary | ICD-10-CM | POA: Insufficient documentation

## 2023-07-22 DIAGNOSIS — I10 Essential (primary) hypertension: Secondary | ICD-10-CM | POA: Insufficient documentation

## 2023-07-22 DIAGNOSIS — I4891 Unspecified atrial fibrillation: Secondary | ICD-10-CM

## 2023-07-22 DIAGNOSIS — Z79899 Other long term (current) drug therapy: Secondary | ICD-10-CM | POA: Insufficient documentation

## 2023-07-22 DIAGNOSIS — E669 Obesity, unspecified: Secondary | ICD-10-CM | POA: Insufficient documentation

## 2023-07-22 DIAGNOSIS — I2510 Atherosclerotic heart disease of native coronary artery without angina pectoris: Secondary | ICD-10-CM | POA: Diagnosis not present

## 2023-07-22 DIAGNOSIS — I4819 Other persistent atrial fibrillation: Secondary | ICD-10-CM | POA: Diagnosis not present

## 2023-07-22 DIAGNOSIS — I4892 Unspecified atrial flutter: Secondary | ICD-10-CM

## 2023-07-22 DIAGNOSIS — Z6832 Body mass index (BMI) 32.0-32.9, adult: Secondary | ICD-10-CM | POA: Insufficient documentation

## 2023-07-22 DIAGNOSIS — G4733 Obstructive sleep apnea (adult) (pediatric): Secondary | ICD-10-CM | POA: Insufficient documentation

## 2023-07-22 HISTORY — PX: ATRIAL FIBRILLATION ABLATION: EP1191

## 2023-07-22 LAB — POCT ACTIVATED CLOTTING TIME: Activated Clotting Time: 492 s

## 2023-07-22 SURGERY — ATRIAL FIBRILLATION ABLATION
Anesthesia: General

## 2023-07-22 MED ORDER — ROCURONIUM BROMIDE 10 MG/ML (PF) SYRINGE
PREFILLED_SYRINGE | INTRAVENOUS | Status: DC | PRN
  Administered 2023-07-22: 20 mg via INTRAVENOUS
  Administered 2023-07-22: 80 mg via INTRAVENOUS

## 2023-07-22 MED ORDER — PROTAMINE SULFATE 10 MG/ML IV SOLN
INTRAVENOUS | Status: DC | PRN
Start: 2023-07-22 — End: 2023-07-22
  Administered 2023-07-22: 50 mg via INTRAVENOUS

## 2023-07-22 MED ORDER — ACETAMINOPHEN 325 MG PO TABS: 650.0000 mg | ORAL_TABLET | ORAL | Status: DC | PRN

## 2023-07-22 MED ORDER — LIDOCAINE 2% (20 MG/ML) 5 ML SYRINGE
INTRAMUSCULAR | Status: DC | PRN
  Administered 2023-07-22: 60 mg via INTRAVENOUS

## 2023-07-22 MED ORDER — ATROPINE SULFATE 1 MG/10ML IJ SOSY
PREFILLED_SYRINGE | INTRAMUSCULAR | Status: DC | PRN
  Administered 2023-07-22: 1 mg via INTRAVENOUS

## 2023-07-22 MED ORDER — HEPARIN SODIUM (PORCINE) 1000 UNIT/ML IJ SOLN
INTRAMUSCULAR | Status: DC | PRN
Start: 2023-07-22 — End: 2023-07-22
  Administered 2023-07-22: 18000 [IU] via INTRAVENOUS

## 2023-07-22 MED ORDER — SODIUM CHLORIDE 0.9% FLUSH: 3.0000 mL | Freq: Two times a day (BID) | INTRAVENOUS | Status: DC

## 2023-07-22 MED ORDER — SODIUM CHLORIDE 0.9 % IV SOLN: INTRAVENOUS | Status: DC

## 2023-07-22 MED ORDER — PROPOFOL 500 MG/50ML IV EMUL
INTRAVENOUS | Status: DC | PRN
Start: 2023-07-22 — End: 2023-07-22
  Administered 2023-07-22: 70 ug/kg/min via INTRAVENOUS

## 2023-07-22 MED ORDER — ONDANSETRON HCL 4 MG/2ML IJ SOLN
INTRAMUSCULAR | Status: DC | PRN
  Administered 2023-07-22: 4 mg via INTRAVENOUS

## 2023-07-22 MED ORDER — FENTANYL CITRATE (PF) 250 MCG/5ML IJ SOLN
INTRAMUSCULAR | Status: DC | PRN
  Administered 2023-07-22 (×2): 50 ug via INTRAVENOUS

## 2023-07-22 MED ORDER — SUGAMMADEX SODIUM 200 MG/2ML IV SOLN
INTRAVENOUS | Status: DC | PRN
  Administered 2023-07-22: 200 mg via INTRAVENOUS

## 2023-07-22 MED ORDER — PROPOFOL 10 MG/ML IV BOLUS
INTRAVENOUS | Status: DC | PRN
  Administered 2023-07-22: 90 mg via INTRAVENOUS

## 2023-07-22 MED ORDER — DEXAMETHASONE SODIUM PHOSPHATE 10 MG/ML IJ SOLN
INTRAMUSCULAR | Status: DC | PRN
  Administered 2023-07-22: 10 mg via INTRAVENOUS

## 2023-07-22 MED ORDER — HEPARIN (PORCINE) IN NACL 1000-0.9 UT/500ML-% IV SOLN
INTRAVENOUS | Status: DC | PRN
  Administered 2023-07-22 (×3): 500 mL

## 2023-07-22 MED ORDER — SODIUM CHLORIDE 0.9% FLUSH: 3.0000 mL | INTRAVENOUS | Status: DC | PRN

## 2023-07-22 MED ORDER — SODIUM CHLORIDE 0.9 % IV SOLN: 250.0000 mL | INTRAVENOUS | Status: DC | PRN

## 2023-07-22 MED ORDER — ONDANSETRON HCL 4 MG/2ML IJ SOLN: 4.0000 mg | Freq: Four times a day (QID) | INTRAMUSCULAR | Status: DC | PRN

## 2023-07-22 MED ORDER — FENTANYL CITRATE (PF) 100 MCG/2ML IJ SOLN
INTRAMUSCULAR | Status: AC
  Filled 2023-07-22: qty 2

## 2023-07-22 SURGICAL SUPPLY — 22 items
BAG SNAP BAND KOVER 36X36 (MISCELLANEOUS) IMPLANT
BLANKET WARM UNDERBOD FULL ACC (MISCELLANEOUS) ×2 IMPLANT
CABLE PFA RX CATH CONN (CABLE) IMPLANT
CATH FARAWAVE ABLATION 31 (CATHETERS) IMPLANT
CATH OCTARAY 2.0 F 3-3-3-3-3 (CATHETERS) IMPLANT
CATH SOUNDSTAR ECO 8FR (CATHETERS) IMPLANT
CATH WEBSTER BI DIR CS D-F CRV (CATHETERS) IMPLANT
CLOSURE PERCLOSE PROSTYLE (VASCULAR PRODUCTS) IMPLANT
COVER SWIFTLINK CONNECTOR (BAG) ×2 IMPLANT
DEVICE CLOSURE MYNXGRIP 6/7F (Vascular Products) IMPLANT
DILATOR VESSEL 38 20CM 16FR (INTRODUCER) IMPLANT
GUIDEWIRE INQWIRE 1.5J.035X260 (WIRE) IMPLANT
INQWIRE 1.5J .035X260CM (WIRE) ×1
KIT VERSACROSS CNCT FARADRIVE (KITS) IMPLANT
MAT PREVALON FULL STRYKER (MISCELLANEOUS) IMPLANT
PACK EP LF (CUSTOM PROCEDURE TRAY) ×2 IMPLANT
PAD DEFIB RADIO PHYSIO CONN (PAD) ×2 IMPLANT
PATCH CARTO3 (PAD) IMPLANT
SHEATH FARADRIVE STEERABLE (SHEATH) IMPLANT
SHEATH PINNACLE 8F 10CM (SHEATH) IMPLANT
SHEATH PINNACLE 9F 10CM (SHEATH) IMPLANT
SHEATH PROBE COVER 6X72 (BAG) IMPLANT

## 2023-07-22 NOTE — H&P (Signed)
Electrophysiology Office Note:    Date:  07/22/2023   ID:  Carlos Thomas, DOB 04/25/1951, MRN [id number]  PCP:  Noberto Retort, MD   [facility] Providers Cardiologist:  Donato Schultz, MD Electrophysiologist:  Maurice Small, MD  Sleep Medicine:  Armanda Magic, MD     Referring MD: No ref. provider found   History of Present Illness:    Carlos Thomas is a 73 y.o. male with a hx listed below, significant for atrial fibrillation and flutter status post ablations in 2010, 2013, 2021 who presents for follow-up.  He is a former patient of Dr. Johney Frame.  At the time of the last ablation, in 2021, all 4 pulmonary veins remained quiescent.  A posterior box lesion set was created.  He has been doing well. He has required 2 cardioversions since the last ablation.    Since his last visit with me he had recurrence with an atypical flutter vs ATand underwent cardioversions in the ER in July, August, and September.  His flecainide was resumed and increased to 100 mg twice daily.  I reviewed the patient's CT and labs. There was no LAA thrombus. he  has not missed any doses of anticoagulation, and he took his dose last night. There have been no changes in the patient's diagnoses, medications, or condition since our recent clinic visit.    EKGs/Labs/Other Studies Reviewed Today:    Echocardiogram:  12/08/2019 EF 55-60%   Monitors:  06/26/2021 Sinus rhythm, rare ectopy. No AF  Stress testing:   Advanced imaging:   Cardiac catherization   EKG:  Last EKG results: sinus bradycardia   Recent Labs: 09/09/2022: TSH 1.148 01/13/2023: ALT 27 03/22/2023: Magnesium 2.1 07/07/2023: BUN 17; Creatinine, Ser 1.15; Hemoglobin 15.1; Platelets 144; Potassium 5.0; Sodium 144     Physical Exam:    VS:  BP (!) 156/69 (BP Location: Right Arm)   Pulse 60   Temp 97.9 F (36.6 C) (Skin)   Resp 18   Ht 5\' 11"  (1.803 m)   Wt 106.6 kg   SpO2 96%   BMI 32.78 kg/m     Wt Readings  from Last 3 Encounters:  07/22/23 106.6 kg  05/26/23 106.3 kg  04/08/23 105.3 kg     GEN: Well nourished, well developed in no acute distress CARDIAC: RRR, no murmurs, rubs, gallops RESPIRATORY:  Normal work of breathing MUSCULOSKELETAL: no edema    ASSESSMENT & PLAN:    Atrial fibrillation - persistent - and atypical atrial flutter Now with increasing occurrences of atypical flutter Possibly AT, but this is less likely. He is on flecainide 100mg  BID  I would rather have him off flecainide since he has  sinus bradycardia and LPFB We discussed the indication for mapping and ablation of atypical flutter.  He would like to proceed  We discussed the indication, rationale, logistics, anticipated benefits, and potential risks of the ablation procedure including but not limited to -- bleed at the groin access site, chest pain, damage to nearby organs such as the diaphragm, lungs, or esophagus, need for a drainage tube, or prolonged hospitalization. I explained that the risk for stroke, heart attack, need for open chest surgery, or even death is very low but not zero. he  expressed understanding and wishes to proceed.   Obesity We discussed the importance of weight loss for maintenance of sinus rhythm  OSA I encouraged ongoing use of CPAP        Medication Adjustments/Labs and Tests Ordered: Current  medicines are reviewed at length with the patient today.  Concerns regarding medicines are outlined above.  Orders Placed This Encounter  Procedures   Informed Consent Details: Physician/Practitioner Attestation; Transcribe to consent form and obtain patient signature   Initiate Pre-op Protocol   Void on call to EP Lab   Confirm CBC and BMP (or CMP) results within 7 days for inpatient and 30 days for outpatient:   Clip right and left femoral area PM before surgery   Clip right internal jugular area PM before surgery   Pre-admission testing diagnosis   EP STUDY   Insert peripheral IV    Meds ordered this encounter  Medications   0.9 %  sodium chloride infusion     Signed, Maurice Small, MD  07/22/2023 9:20 AM    [facility]

## 2023-07-22 NOTE — Anesthesia Postprocedure Evaluation (Signed)
Anesthesia Post Note  Patient: Kendrik Gumm Franta  Procedure(s) Performed: ATRIAL FIBRILLATION ABLATION     Patient location during evaluation: Nursing Unit Anesthesia Type: General Level of consciousness: awake and alert Pain management: pain level controlled Vital Signs Assessment: post-procedure vital signs reviewed and stable Respiratory status: spontaneous breathing, nonlabored ventilation and respiratory function stable Cardiovascular status: blood pressure returned to baseline and stable Postop Assessment: no apparent nausea or vomiting Anesthetic complications: no   There were no known notable events for this encounter.  Last Vitals:  Vitals:   07/22/23 1430 07/22/23 1500  BP: 128/84 134/83  Pulse: (!) 57 (!) 54  Resp: 18 17  Temp:    SpO2: 94% 94%    Last Pain:  Vitals:   07/22/23 1245  TempSrc:   PainSc: 8                  Ceazia Harb

## 2023-07-22 NOTE — Discharge Instructions (Signed)

## 2023-07-22 NOTE — Transfer of Care (Signed)
Immediate Anesthesia Transfer of Care Note  Patient: Carlos Thomas  Procedure(s) Performed: ATRIAL FIBRILLATION ABLATION  Patient Location: PACU and Cath Lab  Anesthesia Type:General  Level of Consciousness: awake, alert , and oriented  Airway & Oxygen Therapy: Patient Spontanous Breathing and Patient connected to nasal cannula oxygen  Post-op Assessment: Report given to RN and Post -op Vital signs reviewed and stable  Post vital signs: Reviewed and stable  Last Vitals:  Vitals Value Taken Time  BP 99/63 07/22/23 1151  Temp    Pulse 54 07/22/23 1155  Resp 21 07/22/23 1155  SpO2 94 % 07/22/23 1155  Vitals shown include unfiled device data.  Last Pain:  Vitals:   07/22/23 1147  TempSrc:   PainSc: 0-No pain         Complications: There were no known notable events for this encounter.

## 2023-07-22 NOTE — Anesthesia Preprocedure Evaluation (Signed)
Anesthesia Evaluation  Patient identified by MRN, date of birth, ID band Patient awake    Reviewed: Allergy & Precautions, NPO status , Patient's Chart, lab work & pertinent test results  History of Anesthesia Complications Negative for: history of anesthetic complications  Airway Mallampati: III  TM Distance: >3 FB Neck ROM: Full    Dental  (+) Teeth Intact, Dental Advisory Given   Pulmonary neg shortness of breath, sleep apnea and Continuous Positive Airway Pressure Ventilation , neg COPD, neg recent URI   breath sounds clear to auscultation       Cardiovascular hypertension, Pt. on medications + CAD   Rhythm:Regular   1. Left ventricular ejection fraction, by estimation, is 55 to 60%. The  left ventricle has normal function. The left ventricle has no regional  wall motion abnormalities. Left ventricular diastolic parameters were  normal.   2. Right ventricular systolic function is normal. The right ventricular  size is normal.   3. The mitral valve is normal in structure. Trivial mitral valve  regurgitation. No evidence of mitral stenosis.   4. The aortic valve is tricuspid. Aortic valve regurgitation is not  visualized. No aortic stenosis is present.   5. Aortic dilatation noted. There is mild dilatation at the level of the  sinuses of Valsalva measuring 38 mm.   6. The inferior vena cava is normal in size with greater than 50%  respiratory variability, suggesting right atrial pressure of 3 mmHg.     Neuro/Psych neg Seizures  Neuromuscular disease    GI/Hepatic Neg liver ROS,GERD  Medicated,,  Endo/Other  negative endocrine ROS    Renal/GU negative Renal ROS     Musculoskeletal negative musculoskeletal ROS (+)    Abdominal   Peds  Hematology  (+) Blood dyscrasia Lab Results      Component                Value               Date                      WBC                      4.9                 07/07/2023                 HGB                      15.1                07/07/2023                HCT                      45.1                07/07/2023                MCV                      96                  07/07/2023                PLT  144 (L)             07/07/2023             xarelto   Anesthesia Other Findings   Reproductive/Obstetrics                              Anesthesia Physical Anesthesia Plan  ASA: 2  Anesthesia Plan: General   Post-op Pain Management: Minimal or no pain anticipated   Induction: Intravenous  PONV Risk Score and Plan: 2 and Ondansetron, Propofol infusion and TIVA  Airway Management Planned: Oral ETT  Additional Equipment: None  Intra-op Plan:   Post-operative Plan: Extubation in OR  Informed Consent: I have reviewed the patients History and Physical, chart, labs and discussed the procedure including the risks, benefits and alternatives for the proposed anesthesia with the patient or authorized representative who has indicated his/her understanding and acceptance.     Dental advisory given  Plan Discussed with: CRNA  Anesthesia Plan Comments:          Anesthesia Quick Evaluation

## 2023-07-22 NOTE — Progress Notes (Signed)
Pt ambulated to and from bathroom to void with no signs of oozing from bilateral groin sites  

## 2023-07-22 NOTE — Anesthesia Procedure Notes (Signed)
Procedure Name: Intubation Date/Time: 07/22/2023 10:22 AM  Performed by: Vena Austria, CRNAPre-anesthesia Checklist: Patient identified, Emergency Drugs available, Suction available, Patient being monitored and Timeout performed Patient Re-evaluated:Patient Re-evaluated prior to induction Oxygen Delivery Method: Circle system utilized Preoxygenation: Pre-oxygenation with 100% oxygen Induction Type: IV induction Laryngoscope Size: McGrath and 3 Grade View: Grade II Tube type: Oral Tube size: 7.0 mm Number of attempts: 1 Airway Equipment and Method: Video-laryngoscopy Placement Confirmation: ETT inserted through vocal cords under direct vision, positive ETCO2, CO2 detector and breath sounds checked- equal and bilateral Secured at: 22 cm Tube secured with: Tape Dental Injury: Teeth and Oropharynx as per pre-operative assessment

## 2023-07-24 MED FILL — Fentanyl Citrate Preservative Free (PF) Inj 100 MCG/2ML: INTRAMUSCULAR | Qty: 2 | Status: AC

## 2023-07-26 ENCOUNTER — Other Ambulatory Visit (HOSPITAL_COMMUNITY): Payer: Self-pay | Admitting: Internal Medicine

## 2023-08-15 ENCOUNTER — Other Ambulatory Visit (HOSPITAL_COMMUNITY): Payer: Self-pay | Admitting: *Deleted

## 2023-08-15 MED ORDER — DILTIAZEM HCL ER COATED BEADS 120 MG PO CP24: 120.0000 mg | ORAL_CAPSULE | Freq: Two times a day (BID) | ORAL | 2 refills | Status: DC

## 2023-08-19 ENCOUNTER — Ambulatory Visit (HOSPITAL_COMMUNITY)
Admission: RE | Admit: 2023-08-19 | Discharge: 2023-08-19 | Disposition: A | Discharge status: Home / Self Care | Payer: Medicare PPO | Source: Ambulatory Visit | Attending: Internal Medicine | Admitting: Internal Medicine

## 2023-08-19 VITALS — BP 140/64 | HR 53 | Ht 71.0 in | Wt 231.4 lb

## 2023-08-19 DIAGNOSIS — G4733 Obstructive sleep apnea (adult) (pediatric): Secondary | ICD-10-CM | POA: Insufficient documentation

## 2023-08-19 DIAGNOSIS — I10 Essential (primary) hypertension: Secondary | ICD-10-CM | POA: Diagnosis not present

## 2023-08-19 DIAGNOSIS — I445 Left posterior fascicular block: Secondary | ICD-10-CM | POA: Diagnosis not present

## 2023-08-19 DIAGNOSIS — I4892 Unspecified atrial flutter: Secondary | ICD-10-CM | POA: Insufficient documentation

## 2023-08-19 DIAGNOSIS — I4891 Unspecified atrial fibrillation: Secondary | ICD-10-CM | POA: Diagnosis present

## 2023-08-19 DIAGNOSIS — D6869 Other thrombophilia: Secondary | ICD-10-CM | POA: Diagnosis not present

## 2023-08-19 DIAGNOSIS — Z7901 Long term (current) use of anticoagulants: Secondary | ICD-10-CM | POA: Diagnosis not present

## 2023-08-19 DIAGNOSIS — I480 Paroxysmal atrial fibrillation: Secondary | ICD-10-CM | POA: Diagnosis not present

## 2023-08-19 NOTE — Progress Notes (Signed)
 Primary Care Physician: Noberto Retort, MD Referring Physician: [facility] ER f/u EP: Dr. Nelly Laurence (formerly Dr. Johney Frame)    Carlos Thomas is a 73 y.o. male with a h/o afib s/p w ablations, 2010 and 2013 that intermittently requires cardioversion, on flecainide. Pt was involved in a traffic situation that escalated his HR on Friday and immediately felt his heart go into afib. He thought he took an extra flecainide but by mistake, he took a nausea pill. He went to the  ER on Saturday but the ER doc did not want to cardiovert as he was rate controlled. He returned on Sunday to the ER with afib around 120 bpm and received successful  cardioversion.  On last visit in April 2019, he continued to maintain S brady at 50 bpm. Continues on xarelto 20 mg daily. He was given an extra 120 mg diltiazem to add to his 240 mg daily Cardizem in the ER.  F/u afib clinic, 12/6. He is in SR and has not had any afib but has neuropathy and his neurologist said that flecainide may be worsening his symptoms. He says that he has had neuropathy  for awhile and the symptoms are stable. He does not want to stop flecainide but wanted to know our experience with this.   F/u in afib clinic, 09/28/19. I have not seen pt since 2019. He decided to stay on the flecainide after his last visit as  we discussed staying on flecainide despite it may be worsening his peripheral neuropathy as mentioned by his neurologist. He states that it has stayed about the same and he prefers to continue its use. He has not noted any afib. EKG shows sinus brady at 46 bpm, LAFB.   F/u in afib clinic, 5/27. He had a very busy day yesterday, doing multiple activities outside around a lot of pollen. He coughed  hard last pm and wento out of rhythm. He took his Cardizem and his flecainide and when he was still out of rhythm a couple hours later, he went to [facility]. After unsuccessfully  trying to convert with IV Cardizem, he was successfully cardioverted.  He did have some  issues with hypotension and bradycardia afterward. He did not leave there until 7:30 this am. Now his BP is normal and he remains  in sinus brady at 53 bpm. He is very tired for being up all night.   F/u in afib clinic, 12/01/19. He had another cardioversion 5/29 and is quite upset that he has had 2 episodes in the last few days. We discussed that he did not go to have an urgent cardioversion if he was stable. He does come out of cardioversion bradycardic and with low BP with an extra 120 mg Cardizem po  or at the time of his first DCCV, Cardizem was used first IV. We did discuss that he could use 30 mg Cardizem that may convert without going to the  ER and not drop his BP/HR as much. He has himself gone back up to 120 mg bid of Cardizem but have asked him not to increase further as he had HR's in the 40's with higher doses of CCB. Now HR mid 50's. He had an atrial flutter ablation in 2010 and an afib ablation in 2013 and may be time to see Dr. Johney Frame  for an repeat afib ablation as he has had a very long time in SR. He will need updated echo. The other option would be to change  antiarrythmic to Tikosyn.  F/u in afib ablation 02/29/20.He is now one month s/p ablation. He only has noted one episode afib and that was drinking regular coffee given to him in a restaurant when he had asked for decaf. He took an extra cardizem 30 mg and it calmed down quickly. He continues on flecainide/cardizem as well as xarelto 20 mg daily for a CHA2DS2VASc dcore of at least 2.   F/u in afib clinic, 5/10, after cardioversion in the ER for atrial flutter at 150 bpm. He presented there after flecainde x 2 and prn cardizem did not touch the 150 bpm rate.  It was successful and he remains in SR. Feels that family stress trigger this episode. No further arrhythmia since CV. He has noted a few elevated  BP's in the pm around 150 systolic. Encouraged to keep readings to review with Dr. Johney Frame in June. 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 Johney Frame.   F/u in afib clinic, 07/17/22, to f/u Dr. Jenel Lucks last appointment  December 2022.  He states he has not noted any afib. Off flecainide  for some time. BP mildly elevated in office, at home stable. EKG shows sinus brady at 53 bpm. Will call and get recent lab results form PCP.   F/u in Afib clinic, 02/03/23. He is s/p ED evaluation due to atrial flutter on 01/13/23 s/p successful DCCV. He is again s/p ED evaluation on 8/5 due to Afib with RVR s/p successful DCCV. He is currently in NSR. He is interested in medication management to avoid the ER. He has not missed any doses of Xarelto.  F/u in Afib clinic, 03/25/23. He is currently in NSR. He is s/p ED visit on 03/22/23 for atrial flutter with RVR. He underwent successful DCCV in the ED. He is currently taking flecainide 50 mg BID and reports to be compliant. Patient tells me specifically he has been under a lot of stress due to daughter being behind paying child support ~30-40k. The night of ED visit he found out selling his two collectible cars would not amount to the needed total. No missed doses of Xarelto.   F/u in Afib clinic, 08/19/23. He is currently in NSR. S/p Afib ablation on 07/22/23 by Dr. Nelly Laurence. No episodes of Afib since ablation. He stopped flecainide post ablation per Dr. Nelly Laurence. He feels better since ablation. No chest pain or SOB. Leg sites healed without issue. No missed doses of Xarelto 20 mg daily.   Today, he denies symptoms of orthopnea, PND, lower extremity edema, dizziness, presyncope, syncope, snoring, daytime somnolence, bleeding, or neurologic sequela. The patient is tolerating medications without difficulties and is otherwise without complaint today.    Past Medical History:  Diagnosis Date   Anticoagulant long-term use    xarelto   Balanitis    GERD (gastroesophageal reflux disease)    History of adenomatous polyp of  colon    2006  tubular adenoma's and hyperplastic polyp's;   2011  tubular adenoma   History of atrial flutter    s/p ablation atrial flutter/atrial fib in 2010   History of MRSA infection    1990's  infected wound near surgerical area    History of repair of hiatal hernia    HTN (hypertension)    Obesity (BMI 30-39.9)    OSA on CPAP    Mild OSA  per study 05-07-2012-- w AHI 6.57/hr now on 13cm H2O  PAF (paroxysmal atrial fibrillation) [facility]) primary cardiologist-  dr Anne Fu   ep cardiologist-  dr Johney Frame---  s/p  ablation afib and aflutter 2010  and  ablation afib/svt  2013   Peripheral neuropathy, hereditary/idiopathic    bilateral feet---  neurologist- dr patel   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[place], 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 , fireman   S/P radiofrequency ablation operation for arrhythmia    01-09-2009  cardiac ablation afib/flutter;   09-13-2011  ablation afib/svt and in ther right atrium   Wears glasses    Past Surgical History:  Procedure Laterality Date   ATRIAL FIBRILLATION ABLATION N/A 09/13/2011   Procedure: ATRIAL FIBRILLATION ABLATION;  Surgeon: Hillis Range, MD;  Location: [facility] CATH LAB;  Service: Cardiovascular;  Laterality: N/A;   ATRIAL FIBRILLATION ABLATION N/A 02/01/2020   Procedure: ATRIAL FIBRILLATION ABLATION;  Surgeon: Hillis Range, MD;  Location: [facility] INVASIVE CV LAB;  Service: Cardiovascular;  Laterality: N/A;   ATRIAL FIBRILLATION ABLATION N/A 07/22/2023   Procedure: ATRIAL FIBRILLATION ABLATION;  Surgeon: Maurice Small, MD;  Location: [facility] INVASIVE CV LAB;  Service: Cardiovascular;  Laterality: N/A;   CARDIAC ELECTROPHYSIOLOGY MAPPING AND ABLATION  [id number]   dr allred   successful ablation atrial fibrillation /  atrial flutter   CARDIAC ELECTROPHYSIOLOGY MAPPING AND ABLATION  09-13-2011   dr Johney Frame   afib mapping/ ablation and  mapping/ablation in the right atrium   CARDIOVASCULAR STRESS TEST  09/2006   per dr Anne Fu note nuclear study normal w/ no ischemia    CARDIOVERSION N/A 09/21/2015   Procedure: CARDIOVERSION;  Surgeon: Lewayne Bunting, MD;  Location[facility] ENDOSCOPY;  Service: Cardiovascular;  Laterality: N/A;   CIRCUMCISION N/A 06/27/2016   Procedure: CIRCUMCISION ADULT;  Surgeon: Jethro Bolus, MD;  Location: [facility];  Service: Urology;  Laterality: N/A;   COLONOSCOPY  last one 02/  2011   DIRECT CURRENT CARDIOVERSION  02/02/2010   dr [name]   LAPAROSCOPIC CHOLECYSTECTOMY  1990's   OPEN HIATAL HERNIA REPAIR  1970's   TEE WITHOUT CARDIOVERSION  09/12/2011   Procedure: TRANSESOPHAGEAL ECHOCARDIOGRAM (TEE);  Surgeon: Corky Crafts, MD;  Location: [facility] ENDOSCOPY;  Service: Cardiovascular;  Laterality: N/A;  trace AR, no LA/LAA thrombus, normal LVfunction, moderate descending aortic atherosclerosis   TEE WITHOUT CARDIOVERSION N/A 09/21/2015   Procedure: TRANSESOPHAGEAL ECHOCARDIOGRAM (TEE);  Surgeon: Lewayne Bunting, MD;  Location: [facility] ENDOSCOPY;  Service: Cardiovascular;  Laterality: N/A; normal LV systolic funciton,  moderate LAE,  no LAA thrombus,  trace MR and TR   TRANSTHORACIC ECHOCARDIOGRAM  06/03/2014   ef 55-60%/  trivial MR and TR    Current Outpatient Medications  Medication Sig Dispense Refill   clotrimazole-betamethasone (LOTRISONE) cream Apply 1 Application topically daily as needed (irritation).     Cyanocobalamin (VITAMIN B-12) 2500 MCG SUBL Take 2,500 mcg by mouth daily.     diltiazem (CARDIZEM CD) 120 MG 24 hr capsule Take 1 capsule (120 mg total) by mouth 2 (two) times daily. 180 capsule 2   diltiazem (CARDIZEM) 30 MG tablet TAKE 1 TABLET EVERY 4 HOURS AS NEEDED A-FIB, HEART RATE OVER 100 45 tablet 1   omeprazole (PRILOSEC) 20 MG capsule Take 20 mg by mouth daily before breakfast.      rivaroxaban (XARELTO) 20 MG TABS tablet Take 1 tablet (20 mg total) by mouth daily with supper. 90 tablet 1   tamsulosin (FLOMAX) 0.4 MG CAPS capsule Take 0.4 mg by mouth every 12 (twelve) hours.      flecainide  (TAMBOCOR)  100 MG tablet Take 1 tablet (100 mg total) by mouth 2 (two) times daily. (Patient not taking: Reported on 08/19/2023) 60 tablet 3   No current facility-administered medications for this encounter.    Allergies  Allergen Reactions   Delsym [Dextromethorphan] Other (See Comments)    Triggers afib/hypertension   ROS- All systems are reviewed and negative except as per the HPI above  Physical Exam: Vitals:   08/19/23 1058  BP: (!) 140/64  Pulse: (!) 53  Weight: 105 kg  Height: 5\' 11"  (1.803 m)     Wt Readings from Last 3 Encounters:  08/19/23 105 kg  07/22/23 106.6 kg  05/26/23 106.3 kg    Labs: Lab Results  Component Value Date   NA 144 07/07/2023   K 5.0 07/07/2023   CL 105 07/07/2023   CO2 26 07/07/2023   GLUCOSE 131 (H) 07/07/2023   BUN 17 07/07/2023   CREATININE 1.15 07/07/2023   CALCIUM 9.2 07/07/2023   MG 2.1 03/22/2023   Lab Results  Component Value Date   INR 1.9 (H) 09/09/2022   No results found for: "CHOL", "HDL", "LDLCALC", "TRIG"  GEN- The patient is well appearing, alert and oriented x 3 today.   Neck - no JVD or carotid bruit noted Lungs- Clear to ausculation bilaterally, normal work of breathing Heart- Regular rate and rhythm, no murmurs, rubs or gallops, PMI not laterally displaced Extremities- no clubbing, cyanosis, or edema Skin - no rash or ecchymosis noted   EKG-   Vent. rate 53 BPM PR interval 154 ms QRS duration 90 ms QT/QTcB 400/375 ms P-R-T axes 48 124 34 Sinus bradycardia Left posterior fascicular block Abnormal ECG When compared with ECG of 22-Jul-2023 11:58, PREVIOUS ECG IS PRESENT  ECHO 12/08/19:  1. Left ventricular ejection fraction, by estimation, is 55 to 60%. The  left ventricle has normal function. The left ventricle has no regional  wall motion abnormalities. Left ventricular diastolic parameters were  normal.   2. Right ventricular systolic function is normal. The right ventricular  size is normal.   3.  The mitral valve is normal in structure. Trivial mitral valve  regurgitation. No evidence of mitral stenosis.   4. The aortic valve is tricuspid. Aortic valve regurgitation is not  visualized. No aortic stenosis is present.   5. Aortic dilatation noted. There is mild dilatation at the level of the  sinuses of Valsalva measuring 38 mm.   6. The inferior vena cava is normal in size with greater than 50%  respiratory variability, suggesting right atrial pressure of 3 mmHg.    Assessment and Plan: 1. Paroxysmal afib/flutter  Staying in SR Successful ER CV, 10/31/20,  for aflutter at 150 bpm, refractory to meds (prn flecainde and prn cardizem)  He is s/p 3rd ablation 02/01/20 S/p DCCV on 01/13/23 (in the ED) S/p DCCV on 02/03/23 (in the ED) S/p DCCV on 03/22/23 (in the ED) S/p Afib ablation on 07/22/23 by Dr. Nelly Laurence.  He is currently in NSR.  Continue diltiazem 120 mg daily.    2.  Chadsvasc score of at least  2  Continue Xarelto 20 mg daily.   3. OSA Continue CPAP.   4. HTN Stable today no changes at this time.   Follow up as scheduled with EP.    Lake Bells, PA-C Afib Clinic [facility] [address] [place], [place] [address] [phone]

## 2023-09-19 ENCOUNTER — Other Ambulatory Visit (HOSPITAL_COMMUNITY): Payer: Self-pay | Admitting: Internal Medicine

## 2023-09-19 DIAGNOSIS — I480 Paroxysmal atrial fibrillation: Secondary | ICD-10-CM

## 2023-09-23 DIAGNOSIS — N401 Enlarged prostate with lower urinary tract symptoms: Secondary | ICD-10-CM | POA: Diagnosis not present

## 2023-09-23 DIAGNOSIS — I10 Essential (primary) hypertension: Secondary | ICD-10-CM | POA: Diagnosis not present

## 2023-09-23 DIAGNOSIS — G609 Hereditary and idiopathic neuropathy, unspecified: Secondary | ICD-10-CM | POA: Diagnosis not present

## 2023-09-23 DIAGNOSIS — K219 Gastro-esophageal reflux disease without esophagitis: Secondary | ICD-10-CM | POA: Diagnosis not present

## 2023-09-23 DIAGNOSIS — I480 Paroxysmal atrial fibrillation: Secondary | ICD-10-CM | POA: Diagnosis not present

## 2023-09-23 DIAGNOSIS — G4733 Obstructive sleep apnea (adult) (pediatric): Secondary | ICD-10-CM | POA: Diagnosis not present

## 2023-09-23 DIAGNOSIS — R7303 Prediabetes: Secondary | ICD-10-CM | POA: Diagnosis not present

## 2023-09-23 DIAGNOSIS — Z125 Encounter for screening for malignant neoplasm of prostate: Secondary | ICD-10-CM | POA: Diagnosis not present

## 2023-10-22 NOTE — Progress Notes (Unsigned)
  Electrophysiology Office Note:   Date:  10/23/2023  ID:  Carlos Thomas, DOB 1950/11/08, MRN [id number]  Primary Cardiologist: Dorothye Gathers, MD Electrophysiologist: Efraim Grange, MD      History of Present Illness:   Carlos Thomas is a 73 y.o. male with h/o PAF s/p ablations, OSA, and HTN seen today for routine electrophysiology followup.   Since last being seen in our clinic the patient reports doing well. Overall,  he denies chest pain, palpitations, dyspnea, PND, orthopnea, nausea, vomiting, dizziness, syncope, edema, weight gain, or early satiety.   Review of systems complete and found to be negative unless listed in HPI.   EP Information / Studies Reviewed:    EKG is ordered today. Personal review as below.       Arrhythmia/Device History S/p PVI 01/2020 S/p PVI and posterior wall ablation 07/2023   Physical Exam:   VS:  BP 130/66 (BP Location: Left Arm, Patient Position: Sitting, Cuff Size: Large)   Pulse (!) 55   Ht 5\' 11"  (1.803 m)   Wt 231 lb (104.8 kg)   SpO2 95%   BMI 32.22 kg/m    Wt Readings from Last 3 Encounters:  10/23/23 231 lb (104.8 kg)  08/19/23 231 lb 6.4 oz (105 kg)  07/22/23 235 lb (106.6 kg)     GEN: No acute distress NECK: No JVD; No carotid bruits CARDIAC: Regular rate and rhythm, no murmurs, rubs, gallops RESPIRATORY:  Clear to auscultation without rales, wheezing or rhonchi  ABDOMEN: Soft, non-tender, non-distended EXTREMITIES:  No edema; No deformity   ASSESSMENT AND PLAN:    Paroxysmal AF S/p multiple ablations as above EKG today shows NSR Has stopped flecainide  without recurrence.  Continue Xarelto  20 mg daily for CHA2DS2/VASc of at least 2, soon to be 3.   Secondary hypercoagulable state Pt on Xarelto  as above   HTN Stable on current regimen   OSA  Encouraged nightly CPAP    Follow up with EP APP or Dr. Arlester Ladd in 12 months  Signed, Tylene Galla, PA-C

## 2023-10-23 ENCOUNTER — Encounter: Payer: Self-pay | Admitting: Student

## 2023-10-23 ENCOUNTER — Ambulatory Visit: Payer: Medicare PPO | Attending: Student | Admitting: Student

## 2023-10-23 VITALS — BP 130/66 | HR 55 | Ht 71.0 in | Wt 231.0 lb

## 2023-10-23 DIAGNOSIS — I10 Essential (primary) hypertension: Secondary | ICD-10-CM

## 2023-10-23 DIAGNOSIS — D6869 Other thrombophilia: Secondary | ICD-10-CM | POA: Diagnosis not present

## 2023-10-23 DIAGNOSIS — G4733 Obstructive sleep apnea (adult) (pediatric): Secondary | ICD-10-CM | POA: Diagnosis not present

## 2023-10-23 DIAGNOSIS — I480 Paroxysmal atrial fibrillation: Secondary | ICD-10-CM

## 2023-10-23 NOTE — Patient Instructions (Signed)
 Medication Instructions:  Your physician recommends that you continue on your current medications as directed. Please refer to the Current Medication list given to you today.  *If you need a refill on your cardiac medications before your next appointment, please call your pharmacy*  Lab Work: None ordered If you have labs (blood work) drawn today and your tests are completely normal, you will receive your results only by: MyChart Message (if you have MyChart) OR A paper copy in the mail If you have any lab test that is abnormal or we need to change your treatment, we will call you to review the results.  Follow-Up: At [facility], you and your health needs are our priority.  As part of our continuing mission to provide you with exceptional heart care, our providers are all part of one team.  This team includes your primary Cardiologist (physician) and Advanced Practice Providers or APPs (Physician Assistants and Nurse Practitioners) who all work together to provide you with the care you need, when you need it.  Your next appointment:   1 year(s)  Provider:   You may see Efraim Grange, MD or one of the following Advanced Practice Providers on your designated Care Team:   Mertha Abrahams, [place] Bambi Lever "Jonelle Neri" [place], PA-C Creighton Doffing, NP      1st Floor: - Lobby - Registration  - Pharmacy  - Lab - Cafe  2nd Floor: - PV Lab - Diagnostic Testing (echo, CT, nuclear med)  3rd Floor: - Vacant  4th Floor: - TCTS (cardiothoracic surgery) - AFib Clinic - Structural Heart Clinic - Vascular Surgery  - Vascular Ultrasound  5th Floor: - [facility] Cardiology (general and EP) - Clinical Pharmacy for coumadin, hypertension, lipid, weight-loss medications, and med management appointments    Valet parking services will be available as well.

## 2023-11-14 DIAGNOSIS — L570 Actinic keratosis: Secondary | ICD-10-CM | POA: Diagnosis not present

## 2023-11-14 DIAGNOSIS — I872 Venous insufficiency (chronic) (peripheral): Secondary | ICD-10-CM | POA: Diagnosis not present

## 2023-11-14 DIAGNOSIS — D225 Melanocytic nevi of trunk: Secondary | ICD-10-CM | POA: Diagnosis not present

## 2023-11-14 DIAGNOSIS — L820 Inflamed seborrheic keratosis: Secondary | ICD-10-CM | POA: Diagnosis not present

## 2024-04-07 DIAGNOSIS — H52223 Regular astigmatism, bilateral: Secondary | ICD-10-CM | POA: Diagnosis not present

## 2024-04-07 DIAGNOSIS — H2513 Age-related nuclear cataract, bilateral: Secondary | ICD-10-CM | POA: Diagnosis not present

## 2024-04-07 DIAGNOSIS — H5203 Hypermetropia, bilateral: Secondary | ICD-10-CM | POA: Diagnosis not present

## 2024-04-07 DIAGNOSIS — H524 Presbyopia: Secondary | ICD-10-CM | POA: Diagnosis not present

## 2024-04-07 DIAGNOSIS — H35362 Drusen (degenerative) of macula, left eye: Secondary | ICD-10-CM | POA: Diagnosis not present

## 2024-04-22 DIAGNOSIS — H353132 Nonexudative age-related macular degeneration, bilateral, intermediate dry stage: Secondary | ICD-10-CM | POA: Diagnosis not present

## 2024-04-22 DIAGNOSIS — H5203 Hypermetropia, bilateral: Secondary | ICD-10-CM | POA: Diagnosis not present

## 2024-04-22 DIAGNOSIS — H524 Presbyopia: Secondary | ICD-10-CM | POA: Diagnosis not present

## 2024-04-22 DIAGNOSIS — H52223 Regular astigmatism, bilateral: Secondary | ICD-10-CM | POA: Diagnosis not present

## 2024-04-22 DIAGNOSIS — H2513 Age-related nuclear cataract, bilateral: Secondary | ICD-10-CM | POA: Diagnosis not present

## 2024-04-22 DIAGNOSIS — H43823 Vitreomacular adhesion, bilateral: Secondary | ICD-10-CM | POA: Diagnosis not present

## 2024-05-01 ENCOUNTER — Other Ambulatory Visit (HOSPITAL_COMMUNITY): Payer: Self-pay | Admitting: Internal Medicine

## 2024-05-03 DIAGNOSIS — R399 Unspecified symptoms and signs involving the genitourinary system: Secondary | ICD-10-CM | POA: Diagnosis not present

## 2024-05-03 DIAGNOSIS — R3915 Urgency of urination: Secondary | ICD-10-CM | POA: Diagnosis not present

## 2024-05-03 DIAGNOSIS — R3914 Feeling of incomplete bladder emptying: Secondary | ICD-10-CM | POA: Diagnosis not present

## 2024-05-03 DIAGNOSIS — N401 Enlarged prostate with lower urinary tract symptoms: Secondary | ICD-10-CM | POA: Diagnosis not present

## 2024-06-16 ENCOUNTER — Other Ambulatory Visit (HOSPITAL_COMMUNITY): Payer: Self-pay | Admitting: *Deleted

## 2024-06-16 DIAGNOSIS — I480 Paroxysmal atrial fibrillation: Secondary | ICD-10-CM

## 2024-06-16 MED ORDER — RIVAROXABAN 20 MG PO TABS: 20.0000 mg | ORAL_TABLET | Freq: Every day | ORAL | 2 refills | Status: AC

## 2024-08-03 ENCOUNTER — Other Ambulatory Visit (HOSPITAL_COMMUNITY): Payer: Self-pay | Admitting: *Deleted

## 2024-08-03 MED ORDER — DILTIAZEM HCL ER COATED BEADS 120 MG PO CP24: 120.0000 mg | ORAL_CAPSULE | Freq: Every day | ORAL | 0 refills | Status: AC
